# Patient Record
Sex: Male | Born: 1940 | Race: White
Health system: Southern US, Community
[De-identification: ages and names within clinical notes are randomized; demographics above are authoritative.]

## PROBLEM LIST (undated history)

## (undated) DIAGNOSIS — I4940 Unspecified premature depolarization: Secondary | ICD-10-CM

## (undated) DIAGNOSIS — G4733 Obstructive sleep apnea (adult) (pediatric): Secondary | ICD-10-CM

## (undated) DIAGNOSIS — K5900 Constipation, unspecified: Secondary | ICD-10-CM

## (undated) DIAGNOSIS — M48061 Spinal stenosis, lumbar region without neurogenic claudication: Secondary | ICD-10-CM

## (undated) DIAGNOSIS — J8410 Pulmonary fibrosis, unspecified: Secondary | ICD-10-CM

## (undated) DIAGNOSIS — C801 Malignant (primary) neoplasm, unspecified: Secondary | ICD-10-CM

## (undated) DIAGNOSIS — K219 Gastro-esophageal reflux disease without esophagitis: Secondary | ICD-10-CM

## (undated) DIAGNOSIS — E7800 Pure hypercholesterolemia, unspecified: Secondary | ICD-10-CM

## (undated) DIAGNOSIS — R269 Unspecified abnormalities of gait and mobility: Secondary | ICD-10-CM

## (undated) DIAGNOSIS — R42 Dizziness and giddiness: Secondary | ICD-10-CM

## (undated) DIAGNOSIS — R609 Edema, unspecified: Secondary | ICD-10-CM

## (undated) DIAGNOSIS — I2510 Atherosclerotic heart disease of native coronary artery without angina pectoris: Secondary | ICD-10-CM

## (undated) DIAGNOSIS — R413 Other amnesia: Secondary | ICD-10-CM

## (undated) DIAGNOSIS — M810 Age-related osteoporosis without current pathological fracture: Secondary | ICD-10-CM

## (undated) DIAGNOSIS — I219 Acute myocardial infarction, unspecified: Secondary | ICD-10-CM

## (undated) DIAGNOSIS — E559 Vitamin D deficiency, unspecified: Secondary | ICD-10-CM

## (undated) DIAGNOSIS — M109 Gout, unspecified: Secondary | ICD-10-CM

## (undated) DIAGNOSIS — N390 Urinary tract infection, site not specified: Secondary | ICD-10-CM

## (undated) DIAGNOSIS — R29898 Other symptoms and signs involving the musculoskeletal system: Secondary | ICD-10-CM

## (undated) DIAGNOSIS — D649 Anemia, unspecified: Secondary | ICD-10-CM

## (undated) DIAGNOSIS — I10 Essential (primary) hypertension: Secondary | ICD-10-CM

## (undated) DIAGNOSIS — G4730 Sleep apnea, unspecified: Secondary | ICD-10-CM

## (undated) DIAGNOSIS — M1990 Unspecified osteoarthritis, unspecified site: Secondary | ICD-10-CM

## (undated) HISTORY — PX: CORONARY ARTERY BYPASS GRAFT: SHX141

## (undated) HISTORY — DX: Pulmonary fibrosis, unspecified: J84.10

## (undated) HISTORY — DX: Constipation, unspecified: K59.00

## (undated) HISTORY — DX: Gastro-esophageal reflux disease without esophagitis: K21.9

## (undated) HISTORY — DX: Pure hypercholesterolemia, unspecified: E78.00

## (undated) HISTORY — DX: Vitamin D deficiency, unspecified: E55.9

## (undated) HISTORY — DX: Gout, unspecified: M10.9

## (undated) HISTORY — DX: Anemia, unspecified: D64.9

## (undated) HISTORY — DX: Edema, unspecified: R60.9

## (undated) HISTORY — DX: Dizziness and giddiness: R42

## (undated) HISTORY — DX: Age-related osteoporosis without current pathological fracture: M81.0

## (undated) HISTORY — DX: Spinal stenosis, lumbar region without neurogenic claudication: M48.061

## (undated) HISTORY — DX: Obstructive sleep apnea (adult) (pediatric): G47.33

## (undated) HISTORY — DX: Essential (primary) hypertension: I10

## (undated) HISTORY — DX: Malignant (primary) neoplasm, unspecified: C80.1

## (undated) HISTORY — DX: Atherosclerotic heart disease of native coronary artery without angina pectoris: I25.10

## (undated) HISTORY — DX: Unspecified abnormalities of gait and mobility: R26.9

## (undated) HISTORY — DX: Other symptoms and signs involving the musculoskeletal system: R29.898

## (undated) HISTORY — DX: Urinary tract infection, site not specified: N39.0

## (undated) HISTORY — DX: Other amnesia: R41.3

## (undated) HISTORY — DX: Unspecified premature depolarization: I49.40

## (undated) HISTORY — DX: Acute myocardial infarction, unspecified: I21.9

## (undated) HISTORY — PX: JOINT REPLACEMENT: SHX530

## (undated) HISTORY — PX: EYE SURGERY: SHX253

## (undated) HISTORY — PX: HERNIA REPAIR: SHX51

## (undated) HISTORY — PX: TONGUE SURGERY: SHX810

## (undated) HISTORY — PX: CARDIAC SURGERY: SHX584

---

## 1994-03-16 ENCOUNTER — Emergency Department: Admit: 1994-03-16 | Payer: Self-pay | Admitting: Emergency Medicine

## 1996-08-16 ENCOUNTER — Emergency Department: Admit: 1996-08-16 | Payer: Self-pay

## 1997-04-04 ENCOUNTER — Ambulatory Visit
Admit: 1997-04-04 | Disposition: A | Discharge status: Home / Self Care | Payer: Self-pay | Source: Ambulatory Visit | Admitting: Adult Reconstructive Orthopaedic Surgery

## 1997-04-23 ENCOUNTER — Inpatient Hospital Stay
Admission: RE | Admit: 1997-04-23 | Disposition: A | Discharge status: Home / Self Care | Payer: Self-pay | Source: Ambulatory Visit | Admitting: Adult Reconstructive Orthopaedic Surgery

## 1999-08-15 ENCOUNTER — Ambulatory Visit
Admit: 1999-08-15 | Disposition: A | Discharge status: Home / Self Care | Payer: Self-pay | Source: Ambulatory Visit | Admitting: Internal Medicine

## 1999-08-19 ENCOUNTER — Ambulatory Visit
Admit: 1999-08-19 | Disposition: A | Discharge status: Home / Self Care | Payer: Self-pay | Source: Ambulatory Visit | Admitting: Internal Medicine

## 1999-09-02 ENCOUNTER — Ambulatory Visit
Admit: 1999-09-02 | Disposition: A | Discharge status: Home / Self Care | Payer: Self-pay | Source: Ambulatory Visit | Admitting: Urology

## 1999-09-29 ENCOUNTER — Ambulatory Visit
Admit: 1999-09-29 | Disposition: A | Discharge status: Home / Self Care | Payer: Self-pay | Source: Ambulatory Visit | Admitting: Urology

## 1999-11-27 ENCOUNTER — Ambulatory Visit
Admit: 1999-11-27 | Disposition: A | Discharge status: Home / Self Care | Payer: Self-pay | Source: Ambulatory Visit | Admitting: Urology

## 2000-12-22 ENCOUNTER — Ambulatory Visit: Admission: RE | Admit: 2000-12-22 | Payer: Self-pay | Source: Ambulatory Visit | Admitting: Gastroenterology

## 2001-01-20 ENCOUNTER — Emergency Department: Admit: 2001-01-20 | Payer: Self-pay | Admitting: Emergency Medicine

## 2002-05-23 DIAGNOSIS — I10 Essential (primary) hypertension: Secondary | ICD-10-CM | POA: Insufficient documentation

## 2002-05-23 DIAGNOSIS — E7800 Pure hypercholesterolemia, unspecified: Secondary | ICD-10-CM | POA: Diagnosis present

## 2003-08-10 ENCOUNTER — Observation Stay
Admission: EM | Admit: 2003-08-10 | Disposition: A | Discharge status: Home / Self Care | Payer: Self-pay | Source: Emergency Department | Admitting: Internal Medicine

## 2003-09-05 ENCOUNTER — Emergency Department: Admit: 2003-09-05 | Payer: Self-pay | Source: Emergency Department | Admitting: Emergency Medicine

## 2004-11-17 ENCOUNTER — Emergency Department: Admit: 2004-11-17 | Payer: Self-pay | Source: Emergency Department | Admitting: Emergency Medicine

## 2004-12-23 ENCOUNTER — Emergency Department: Admit: 2004-12-23 | Payer: Self-pay | Source: Emergency Department

## 2004-12-23 ENCOUNTER — Inpatient Hospital Stay
Admission: EM | Admit: 2004-12-23 | Disposition: A | Discharge status: Home / Self Care | Payer: Self-pay | Source: Other Acute Inpatient Hospital | Admitting: Cardiovascular Disease

## 2005-01-19 DIAGNOSIS — I2510 Atherosclerotic heart disease of native coronary artery without angina pectoris: Secondary | ICD-10-CM | POA: Diagnosis present

## 2005-01-19 DIAGNOSIS — Z951 Presence of aortocoronary bypass graft: Secondary | ICD-10-CM

## 2005-02-01 ENCOUNTER — Emergency Department: Admit: 2005-02-01 | Payer: Self-pay | Source: Emergency Department

## 2005-02-16 ENCOUNTER — Ambulatory Visit: Admit: 2005-02-16 | Disposition: A | Discharge status: Home / Self Care | Payer: Self-pay | Source: Ambulatory Visit

## 2005-02-22 ENCOUNTER — Ambulatory Visit: Admit: 2005-02-22 | Disposition: A | Discharge status: Home / Self Care | Payer: Self-pay | Source: Ambulatory Visit

## 2005-04-12 ENCOUNTER — Emergency Department: Admit: 2005-04-12 | Payer: Self-pay | Source: Emergency Department

## 2005-08-30 ENCOUNTER — Emergency Department: Admit: 2005-08-30 | Payer: Self-pay | Source: Emergency Department | Admitting: Emergency Medicine

## 2005-08-30 LAB — TSH: TSH: 0.5 u[IU]/mL (ref 0.34–4.82)

## 2005-08-30 LAB — URINALYSIS
Bilirubin, UA: NEGATIVE
Glucose, UA: NEGATIVE
Ketones UA: NEGATIVE
Leukocyte Esterase, UA: NEGATIVE
Nitrite, UA: NEGATIVE
Protein, UR: NEGATIVE
Specific Gravity UA POCT: 1.01 (ref <=1.030)
Urine pH: 6 (ref 5.0–8.0)
Urobilinogen, UA: 0.2

## 2005-08-30 LAB — CBC WITH AUTO DIFFERENTIAL CERNER
Basophils Absolute: 0 /mm3 (ref 0.0–0.2)
Basophils: 0 % (ref 0–2)
Eosinophils Absolute: 0 /mm3 (ref 0.0–0.7)
Eosinophils: 0 % (ref 0–5)
Granulocytes Absolute: 10.3 /mm3 — ABNORMAL HIGH (ref 1.8–8.1)
Hematocrit: 43.5 % (ref 42.0–52.0)
Hgb: 15 G/DL (ref 13.0–17.0)
Lymphocytes Absolute: 0.9 /mm3 (ref 0.5–4.4)
Lymphocytes: 7 % — ABNORMAL LOW (ref 15–41)
MCH: 34.6 PG — ABNORMAL HIGH (ref 28.0–32.0)
MCHC: 34.4 G/DL (ref 32.0–36.0)
MCV: 100.6 FL — ABNORMAL HIGH (ref 80.0–100.0)
MPV: 8.4 FL (ref 7.4–10.4)
Monocytes Absolute: 0.5 /mm3 (ref 0.0–1.2)
Monocytes: 5 % (ref 0–11)
Neutrophils %: 88 % — ABNORMAL HIGH (ref 52–75)
Platelets: 188 /mm3 (ref 140–400)
RBC: 4.32 /mm3 — ABNORMAL LOW (ref 4.70–6.00)
RDW: 14.5 % (ref 11.5–15.0)
WBC: 11.7 /mm3 — ABNORMAL HIGH (ref 3.5–10.8)

## 2005-08-30 LAB — CREATINE KINASE W/O REFLEX (SOFT): Creatine Kinase (CK): 54 U/L (ref 20–297)

## 2005-08-30 LAB — BASIC METABOLIC PANEL
BUN: 17 MG/DL (ref 7–21)
CO2: 23 MEQ/L (ref 22–31)
Calcium: 9.8 MG/DL (ref 8.6–10.2)
Chloride: 105 MEQ/L (ref 98–107)
Creatinine: 0.9 MG/DL (ref 0.5–1.4)
Glucose: 130 MG/DL — ABNORMAL HIGH (ref 70–105)
Potassium: 4.6 MEQ/L (ref 3.6–5.0)
Sodium: 139 MEQ/L (ref 136–143)

## 2005-08-30 LAB — D-DIMER (DVT) CERNER: D-Dimer (DVT): 757 ng/mL — ABNORMAL HIGH (ref 0–500)

## 2005-08-30 LAB — URINE MICROSCOPIC

## 2005-08-30 LAB — GFR

## 2005-08-30 LAB — TROPONIN I QUANTITATIVE LEVEL CERNER: Troponin I: <0.08 ng/mL

## 2005-10-15 ENCOUNTER — Emergency Department
Admit: 2005-10-15 | Payer: Self-pay | Source: Emergency Department | Attending: Emergency Medicine | Admitting: Emergency Medicine

## 2005-10-16 LAB — URINALYSIS
Bilirubin, UA: NEGATIVE
Glucose, UA: NEGATIVE
Ketones UA: NEGATIVE
Leukocyte Esterase, UA: NEGATIVE
Nitrite, UA: NEGATIVE
Protein, UR: NEGATIVE
Specific Gravity UA POCT: <=1.005 (ref <=1.030)
Urine pH: 6.5 (ref 5.0–8.0)
Urobilinogen, UA: 0.2

## 2005-10-16 LAB — COMPREHENSIVE METABOLIC PANEL
ALT: 43 U/L (ref 21–72)
AST (SGOT): 31 U/L (ref 20–57)
Albumin/Globulin Ratio: 1.4 (ref 1.1–1.8)
Albumin: 4.2 G/DL (ref 3.7–5.1)
Alkaline Phosphatase: 110 U/L (ref 43–122)
BUN: 21 MG/DL (ref 7–21)
Bilirubin, Total: 0.5 MG/DL (ref 0.2–1.3)
CO2: 26 MEQ/L (ref 22–31)
Calcium: 9.4 MG/DL (ref 8.6–10.2)
Chloride: 104 MEQ/L (ref 98–107)
Creatinine: 1.1 MG/DL (ref 0.5–1.4)
Globulin: 3.1 G/DL (ref 2.0–3.7)
Glucose: 115 MG/DL — ABNORMAL HIGH (ref 70–105)
Potassium: 4.3 MEQ/L (ref 3.6–5.0)
Protein, Total: 7.3 G/DL (ref 6.0–8.0)
Sodium: 141 MEQ/L (ref 136–143)

## 2005-10-16 LAB — CBC WITH MANUAL DIFF- CERNER
Bands: 4 % (ref 0–9)
Eosinophils %: 2 % (ref 0–5)
Hematocrit: 42.5 % (ref 42.0–52.0)
Hgb: 14.9 G/DL (ref 13.0–17.0)
Lymphocytes Manual: 16 % (ref 15–41)
MCH: 34.8 PG — ABNORMAL HIGH (ref 28.0–32.0)
MCHC: 35.1 G/DL (ref 32.0–36.0)
MCV: 98.9 FL (ref 80.0–100.0)
MPV: 8.2 FL (ref 7.4–10.4)
Monocytes Manual: 6 % (ref 0–8)
Neutrophils %: 72 % (ref 52–75)
Platelets: 174 /mm3 (ref 140–400)
RBC Morphology: ABNORMAL
RBC: 4.29 /mm3 — ABNORMAL LOW (ref 4.70–6.00)
RDW: 13.4 % (ref 11.5–15.0)
WBC: 12.1 /mm3 — ABNORMAL HIGH (ref 3.5–10.8)

## 2005-10-16 LAB — CREATINE KINASE W/O REFLEX (SOFT): Creatine Kinase (CK): 63 U/L (ref 20–297)

## 2005-10-16 LAB — URINE MICROSCOPIC

## 2005-10-16 LAB — GFR

## 2005-10-16 LAB — TROPONIN I QUANTITATIVE LEVEL CERNER: Troponin I: <0.08 ng/mL

## 2005-10-16 LAB — CALCIUM IONIZED-CALC. CERNER: Calcium Ionized Calculated: 2 mEQ/L (ref 1.9–2.3)

## 2005-11-11 ENCOUNTER — Emergency Department: Admit: 2005-11-11 | Payer: Self-pay | Source: Emergency Department

## 2005-11-11 LAB — CBC WITH AUTO DIFFERENTIAL CERNER
Basophils Absolute: 0 /mm3 (ref 0.0–0.2)
Basophils: 0 % (ref 0–2)
Eosinophils Absolute: 0.2 /mm3 (ref 0.0–0.7)
Eosinophils: 1 % (ref 0–5)
Granulocytes Absolute: 11.9 /mm3 — ABNORMAL HIGH (ref 1.8–8.1)
Hematocrit: 38.5 % — ABNORMAL LOW (ref 42.0–52.0)
Hgb: 13.2 G/DL (ref 13.0–17.0)
Lymphocytes Absolute: 0.8 /mm3 (ref 0.5–4.4)
Lymphocytes: 6 % — ABNORMAL LOW (ref 15–41)
MCH: 34.8 PG — ABNORMAL HIGH (ref 28.0–32.0)
MCHC: 34.3 G/DL (ref 32.0–36.0)
MCV: 101.4 FL — ABNORMAL HIGH (ref 80.0–100.0)
MPV: 8.4 FL (ref 7.4–10.4)
Monocytes Absolute: 0.7 /mm3 (ref 0.0–1.2)
Monocytes: 5 % (ref 0–11)
Neutrophils %: 88 % — ABNORMAL HIGH (ref 52–75)
Platelets: 172 /mm3 (ref 140–400)
RBC: 3.79 /mm3 — ABNORMAL LOW (ref 4.70–6.00)
RDW: 13.9 % (ref 11.5–15.0)
WBC: 13.5 /mm3 — ABNORMAL HIGH (ref 3.5–10.8)

## 2005-11-11 LAB — COMPREHENSIVE METABOLIC PANEL
ALT: 31 U/L (ref 21–72)
AST (SGOT): 29 U/L (ref 20–57)
Albumin/Globulin Ratio: 1.5 (ref 1.1–1.8)
Albumin: 3.7 G/DL (ref 3.7–5.1)
Alkaline Phosphatase: 86 U/L (ref 43–122)
BUN: 23 MG/DL — ABNORMAL HIGH (ref 7–21)
Bilirubin, Total: 0.5 MG/DL (ref 0.2–1.3)
CO2: 26 MEQ/L (ref 22–31)
Calcium: 9.3 MG/DL (ref 8.6–10.2)
Chloride: 104 MEQ/L (ref 98–107)
Creatinine: 1.1 MG/DL (ref 0.5–1.4)
Globulin: 2.4 G/DL (ref 2.0–3.7)
Glucose: 111 MG/DL — ABNORMAL HIGH (ref 70–105)
Potassium: 4.8 MEQ/L (ref 3.6–5.0)
Protein, Total: 6.1 G/DL (ref 6.0–8.0)
Sodium: 139 MEQ/L (ref 136–143)

## 2005-11-11 LAB — TROPONIN I QUANTITATIVE LEVEL CERNER: Troponin I: <0.08 ng/mL

## 2005-11-11 LAB — CALCIUM IONIZED-CALC. CERNER: Calcium Ionized Calculated: 2.2 mEQ/L (ref 1.9–2.3)

## 2005-11-11 LAB — GFR

## 2005-11-12 DIAGNOSIS — H8110 Benign paroxysmal vertigo, unspecified ear: Secondary | ICD-10-CM | POA: Insufficient documentation

## 2006-03-28 ENCOUNTER — Emergency Department: Admit: 2006-03-28 | Payer: Self-pay | Source: Emergency Department | Admitting: Emergency Medicine

## 2006-03-28 LAB — URINE MICROSCOPIC

## 2006-03-28 LAB — CBC WITH MANUAL DIFF- CERNER
Bands: 1 % (ref 0–9)
Eosinophils %: 3 % (ref 0–5)
Hematocrit: 40.3 % — ABNORMAL LOW (ref 42.0–52.0)
Hgb: 14.1 G/DL (ref 13.0–17.0)
Lymphocytes Manual: 17 % (ref 15–41)
MCH: 35.4 PG — ABNORMAL HIGH (ref 28.0–32.0)
MCHC: 35 G/DL (ref 32.0–36.0)
MCV: 101 FL — ABNORMAL HIGH (ref 80.0–100.0)
MPV: 8.2 FL (ref 7.4–10.4)
Monocytes Manual: 5 % (ref 0–8)
Neutrophils %: 74 % (ref 52–75)
Platelets: 181 /mm3 (ref 140–400)
RBC Morphology: NORMAL
RBC: 3.99 /mm3 — ABNORMAL LOW (ref 4.70–6.00)
RDW: 13.6 % (ref 11.5–15.0)
WBC: 8.8 /mm3 (ref 3.5–10.8)

## 2006-03-28 LAB — COMPREHENSIVE METABOLIC PANEL
ALT: 25 U/L (ref 21–72)
AST (SGOT): 28 U/L (ref 20–57)
Albumin/Globulin Ratio: 1.6 (ref 1.1–1.8)
Albumin: 4.4 G/DL (ref 3.7–5.1)
Alkaline Phosphatase: 75 U/L (ref 43–122)
BUN: 21 MG/DL (ref 7–21)
Bilirubin, Total: 0.2 MG/DL (ref 0.2–1.3)
CO2: 27 MEQ/L (ref 22–31)
Calcium: 10.1 MG/DL (ref 8.6–10.2)
Chloride: 105 MEQ/L (ref 98–107)
Creatinine: 0.8 MG/DL (ref 0.5–1.4)
Globulin: 2.7 G/DL (ref 2.0–3.7)
Glucose: 147 MG/DL — ABNORMAL HIGH (ref 70–105)
Potassium: 4 MEQ/L (ref 3.6–5.0)
Protein, Total: 7.1 G/DL (ref 6.0–8.0)
Sodium: 141 MEQ/L (ref 136–143)

## 2006-03-28 LAB — URINALYSIS
Bilirubin, UA: NEGATIVE
Glucose, UA: NEGATIVE
Ketones UA: NEGATIVE
Leukocyte Esterase, UA: NEGATIVE
Nitrite, UA: NEGATIVE
Protein, UR: NEGATIVE
Specific Gravity UA POCT: 1.02 (ref <=1.030)
Urine pH: 5.5 (ref 5.0–8.0)
Urobilinogen, UA: 0.2

## 2006-03-28 LAB — CREATINE KINASE W/O REFLEX (SOFT)
Creatine Kinase (CK): 52 U/L (ref 20–297)
Creatine Kinase (CK): 44 U/L (ref 20–297)

## 2006-03-28 LAB — TROPONIN I QUANTITATIVE LEVEL CERNER
Troponin I: 0.02 ng/mL (ref 0.00–0.03)
Troponin I: 0.02 ng/mL (ref 0.00–0.03)

## 2006-03-28 LAB — B-TYPE NATRIURETIC PEPTIDE: B-Natriuretic Peptide: 56 pg/mL (ref ?–100)

## 2006-03-28 LAB — TSH: TSH: 1.16 u[IU]/mL (ref 0.34–4.82)

## 2006-03-28 LAB — GFR

## 2006-03-28 LAB — CALCIUM IONIZED-CALC. CERNER: Calcium Ionized Calculated: 2.2 mEQ/L (ref 1.9–2.3)

## 2006-04-06 ENCOUNTER — Emergency Department: Admit: 2006-04-06 | Payer: Self-pay | Source: Emergency Department | Admitting: Emergency Medicine

## 2006-04-06 LAB — CK: Creatine Kinase (CK): 46 U/L (ref 20–297)

## 2006-04-06 LAB — BASIC METABOLIC PANEL
BUN: 21 mg/dL — ABNORMAL HIGH (ref 8–20)
CO2: 30 mEq/L (ref 21–30)
Calcium: 9.9 mg/dL (ref 8.6–10.2)
Chloride: 101 mEq/L (ref 98–107)
Creatinine: 1 mg/dL (ref 0.6–1.5)
Glucose: 116 mg/dL — ABNORMAL HIGH (ref 70–100)
Potassium: 4.4 mEq/L (ref 3.6–5.0)
Sodium: 140 mEq/L (ref 136–146)

## 2006-04-06 LAB — CBC WITH AUTO DIFFERENTIAL CERNER
Basophils Absolute: 0 /mm3 (ref 0.0–0.2)
Basophils: 0 % (ref 0–2)
Eosinophils Absolute: 0.3 /mm3 (ref 0.0–0.7)
Eosinophils: 3 % (ref 0–5)
Granulocytes Absolute: 6.8 /mm3 (ref 1.8–8.1)
Hematocrit: 40.8 % — ABNORMAL LOW (ref 42.0–52.0)
Hgb: 14 G/DL (ref 13.0–17.0)
Lymphocytes Absolute: 0.9 /mm3 (ref 0.5–4.4)
Lymphocytes: 11 % — ABNORMAL LOW (ref 15–41)
MCH: 35.2 PG — ABNORMAL HIGH (ref 28.0–32.0)
MCHC: 34.3 G/DL (ref 32.0–36.0)
MCV: 102.6 FL — ABNORMAL HIGH (ref 80.0–100.0)
MPV: 7.7 FL (ref 7.4–10.4)
Monocytes Absolute: 0.7 /mm3 (ref 0.0–1.2)
Monocytes: 8 % (ref 0–11)
Neutrophils %: 78 % — ABNORMAL HIGH (ref 52–75)
Platelets: 193 /mm3 (ref 140–400)
RBC: 3.97 /mm3 — ABNORMAL LOW (ref 4.70–6.00)
RDW: 13.6 % (ref 11.5–15.0)
WBC: 8.6 /mm3 (ref 3.5–10.8)

## 2006-04-06 LAB — PT AND APTT
PT INR: 1 {INR} (ref 0.9–1.1)
PT: 12 s (ref 10.8–13.3)
PTT: 25 s (ref 21–32)

## 2006-04-06 LAB — GFR

## 2006-04-06 LAB — I-STAT TROPONIN CERNER: i-STAT Troponin: 0.01 ng/mL

## 2007-01-12 ENCOUNTER — Emergency Department: Admit: 2007-01-12 | Payer: Self-pay | Source: Emergency Department

## 2007-07-06 DIAGNOSIS — D539 Nutritional anemia, unspecified: Secondary | ICD-10-CM | POA: Diagnosis present

## 2010-11-28 LAB — ECG 12-LEAD
Atrial Rate: 82 {beats}/min
Atrial Rate: 89 {beats}/min
P Axis: 73 degrees
P Axis: 76 degrees
P-R Interval: 158 ms
P-R Interval: 164 ms
Q-T Interval: 380 ms
Q-T Interval: 388 ms
QRS Duration: 100 ms
QRS Duration: 98 ms
QTC Calculation (Bezet): 453 ms
QTC Calculation (Bezet): 462 ms
R Axis: 70 degrees
R Axis: 70 degrees
T Axis: 64 degrees
T Axis: 71 degrees
Ventricular Rate: 82 {beats}/min
Ventricular Rate: 89 {beats}/min

## 2010-12-07 LAB — ECG 12-LEAD
Atrial Rate: 90 {beats}/min
P Axis: 84 degrees
P-R Interval: 156 ms
Q-T Interval: 380 ms
QRS Duration: 94 ms
QTC Calculation (Bezet): 464 ms
R Axis: 74 degrees
T Axis: 62 degrees
Ventricular Rate: 90 {beats}/min

## 2010-12-08 LAB — ECG 12-LEAD
Atrial Rate: 79 {beats}/min
Atrial Rate: 74 {beats}/min
P Axis: 74 degrees
P Axis: 74 degrees
P-R Interval: 154 ms
P-R Interval: 158 ms
Q-T Interval: 420 ms
Q-T Interval: 418 ms
QRS Duration: 102 ms
QRS Duration: 102 ms
QTC Calculation (Bezet): 481 ms
QTC Calculation (Bezet): 463 ms
R Axis: 67 degrees
R Axis: 75 degrees
T Axis: 46 degrees
T Axis: 68 degrees
Ventricular Rate: 79 {beats}/min
Ventricular Rate: 74 {beats}/min

## 2010-12-10 NOTE — Consults (Signed)
ATTENDING MD:      Tera Partridge MD: Janace Litten, MD      ADMITTED:      03/28/2006      CONSULTED:      ROOM:          ED            LOCATION OF CONSULTATION:  The patient was seen in the emergency room.            HISTORY OF PRESENT ILLNESS:  This is a 70 year old man, status post      coronary artery bypass grafting in 2006, followed by Dr. Selinda Orion and Mikael Spray.      Last stress test several months ago in July 2007 was normal.  According to      the patient, he has done cardiac rehabilitation at [facility] and      has no problems with that.  He did not have any problems.  On Friday,      03/25/2006, he developed what sounded like a GI upset.  On Saturday on his      way to his routine exercise, he noticed his heart rate was 109.  He      recalled vague left-sided sensation and refused to call it chest pain. He      did not exercise and went home, took a sublingual nitroglycerin for the      first time, did feel better.  Sunday he called the [facility] nurse about his      pulse rate.  She tried to calm him down.  A lady called 1 of the doctors      and spoke to today early this morning around 1:00 a.m., he stated that he      did not feel well and he waited until 3:30 a.m. and drove himself to the      [facility] ER.            PHYSICAL EXAMINATION:  Blood pressure 175/82.  Heart rate 95.  Afebrile.      Pulse oximetry 99%.  No chest pain.  Chest clear.  Cardiac examination:  S4      gallop.  Abdomen:  Soft and nontender.  Extremities:  No peripheral edema.            LABORATORY DATA AND DIAGNOSTIC FINDINGS:  The 12-lead EKG showed possible      old inferior wall infarction, but no acute changes.  This was compared to      old tracings that he had, but he also carries a heart chart with him from      July 2007 that was basically unchanged.  Serial EKGs and enzymes were      negative.  Troponin I was negative x2, so was CPK.            ASSESSMENT AND RECOMMENDATIONS:  Did speak to Dr. Selinda Orion.   The patient was      okay with going home.  He will be seen by Dr. Selinda Orion today for an exercise      stress test in the cardiology office.  He was very anxious.  Questionable      atypical chest pain with known coronary artery disease.  The patient can be      discharged.  Electronic Signing MD: Janace Litten, MD                  D 03/28/2006 11:27 A; T 03/28/2006  4:40 P; [id number] - - , [id number], #[id number]      CC:  Janace Litten, MD

## 2011-06-21 DIAGNOSIS — M48061 Spinal stenosis, lumbar region without neurogenic claudication: Secondary | ICD-10-CM | POA: Insufficient documentation

## 2012-02-25 NOTE — Discharge Summary (Unsigned)
DATE OF BIRTH:                        16-Jan-1941            ADMISSION DATE:                     12/23/2004      DISCHARGE DATE:                     12/31/2004            ATTENDING PHYSICIAN:                  Ollen Gross Massimiano, MD            ADMISSION DIAGNOSES:  Coronary artery disease, history of acute myocardial      infarction, history of hypertension, history of gout, history of hip      replacement, status post intraaortic balloon pump placement.            FINAL DIAGNOSES:  Status post emergency coronary artery bypass grafting      times 5, history of hypertension, history of gout, leukocytosis with      negative cultures that is improving.            HISTORY OF PRESENT ILLNESS:  This is a 72 year old male who presents with      symptoms of acute myocardial infarction. The EKG showed acute inferior      changes.  Cardiac catheterization revealed 3-vessel coronary artery with      reduced ventricular function.  The intraaortic balloon pump was placed.      The patient was emergently taken to the operating room for the coronary      artery bypass grafting.            HOSPITAL COURSE:  During his admission, the patient had acute myocardial      infarction, and a balloon pump was placed.  With the balloon pump in place,      the patient had episodes of diaphoresis and nausea, which were compatible      with ischemia.  The patient was emergently taken to the operating room for      coronary artery bypass grafting.  The patient underwent coronary artery      bypass grafting times 5 with the left internal mammary artery to the left      anterior descending and a reverse saphenous vein graft to diagonal, obtuse      marginal 1, obtuse marginal 2, and right posterior descending arteries.      Moderate inotropic support was started.  The patient had coagulopathy,      which was treated with platelets and factor transfusions, which was      resolved.  The patient was transferred to the CVICU in satisfactory       condition.            On postoperative day #1, the patient was extubated without any      complication.  A chest tube had been placed secondary to increased      drainage. His aortic balloon pump was 1-2 with augmentation of both blood      pressure of 115-120.  Balloon pump was discontinued without any      complication.  He was also weaned off the Primacor and Levophed drip.            The  patient was transferred to the stepdown unit on postoperative day #3      without any complications.  During his postoperative recovery in the      stepdown unit, the patient continued to do well.  His chest tube was      removed without any complication. His repeat white count was noted to be      elevated, and blood cultures and urine cultures was ordered.  He did      receive 1 g of Rocephin prophylactically.  His repeat white count on      postoperative day #6 was 17,000, which was improved compared to the      previous day.            He continued to do well without any evidence of fever.  On postoperative      day #7, the patient's vital signs were stable.  He remains afebrile.  His      O2 saturation was 97% in room air.  His repeat white count was 16,000.  His      repeat white count was 16,000 with preliminary blood culture report, which      was negative.            The patient was discharged home in stable condition.            PROCEDURES:  Emergency coronary artery bypass times 5 with left internal      mammary artery to left anterior descending and reverse saphenous vein graft      to diagonal, obtuse marginal 1, obtuse marginal 2, and right posterior      descending arteries.  Status post intraaortic balloon pump placement in      catheterization lab.            COMPLICATIONS:  Elevation of white count, which was improving.            DISCHARGE MEDICATIONS:  Amiodarone 200 mg once a day, Zocor 40 mg once a      day, aspirin 225 mg once a day, Altace 2.5 mg once a day, Toprol XL 25 mg      once a day, Lasix 20 mg  once a day, K-Dur 20 mEq once a day, Darvocet take      as directed.            DISCHARGE INSTRUCTIONS:  Follow up with cardiovascular surgery in 2 weeks,      follow up with primary care and cardiologist as directed.            CONDITION ON DISCHARGE:  Stable.            DISPOSITION:  Home.                                          ___________________________________          Date Signed: __________      Jacqualin Combes, MD  ([id number])            D: 12/31/2004 by Osborne Casco, PA-C      T: 01/03/2005 by [id number] ([id number]) Dorris Carnes: [id number])      cc:  Jacqualin Combes, MD            [facility]      Dr. Glean Hess Cardiologist

## 2012-02-25 NOTE — Op Note (Unsigned)
DATE OF BIRTH:                        09/27/1940      ADMISSION DATE:                     12/23/2004            PATIENT LOCATION:                    [id number]            DATE OF PROCEDURE:                   12/24/2004      SURGEON:                            Jacqualin Combes, MD      ASSISTANT(S):                         Verdene Lennert, MD                  PREOPERATIVE DIAGNOSES      1.   ACUTE MYOCARDIAL INFARCTION.      2.   SEVERE THREE-VESSEL CORONARY ARTERY DISEASE.      3.   REDUCED VENTRICULAR FUNCTION.      4.   HYPERTENSION.      5.   HISTORY OF HIP REPLACEMENT.      6.   STATUS POST INTRA-AORTIC BALLOON PUMP PLACEMENT IN THE CATHETERIZATION      LAB.            POSTOPERATIVE DIAGNOSIS:            PROCEDURE:  EMERGENCY CORONARY ARTERY BYPASS X5 WITH THE LEFT INTERNAL      MAMMARY ARTERY TO THE LEFT ANTERIOR DESCENDING AND REVERSED SAPHENOUS VEIN      GRAFTS, INDIVIDUALLY, TO THE DIAGONAL, OBTUSE MARGINAL 1, OBTUSE MARGINAL      2, AND RIGHT POSTERIOR DESCENDING ARTERIES.            ASSISTANT:  Gaynelle Arabian, R.N.            INDICATIONS:  This is a 72 year old man who presented with signs and      symptoms of an acute myocardial infarction.  EKG showed acute inferior      changes.  Cardiac catheterization by Dr. Glean Hess revealed severe 3-vessel      coronary artery disease with a reduced ventricular function.  An      intra-aortic balloon pump was placed.  Cardiac enzymes revealed a peak CPK      of 2800.  With the balloon pump in place, the patient did have some      episodes of diaphoresis and nausea, which I felt were compatible with      ischemia, and therefore I felt it appropriate to go emergently to the      operating room.            FINDINGS:  The aorta was of normal length and caliber and had no      significant plaque or calcification.  The lateral and inferior walls were      akinetic.  The inferior wall showed patchy scarring consistent with old      infarction.  The infarct vessel appeared to  be in  OM branches on the      lateral wall.  All the coronary arteries were graftable at 1.5 mm luminal      diameter, the exception being the PDA, which was a very small, 1-mm vessel.      However, it was still graftable.  Vein was harvested using open technique      and was of good quality.  The mammary was excellent.  The patient was      separated from cardiopulmonary bypass without difficulty and with only      moderate inotropic support and the previously placed balloon pump.  He left      the operating room in stable condition, after correction of a      coagulopathy.            DESCRIPTION OF PROCEDURE:  After the induction of adequate general      anesthesia and placement of monitoring lines, the patient was placed in the      supine position on the operating room table and prepped and draped in the      usual sterile fashion.  Vein was harvested from the leg.  The chest was      opened through a median sternotomy incision. The mammary artery was taken      down on a pedicle.  The patient was then fully heparinized.  Conduits were      prepared.  He was then cannulated, placed on cardiopulmonary bypass, and      cooled to 32 degrees.            Distal anastomoses were performed with the technique of intermittent      cross-clamp and fibrillation using running 8-0 Prolene for the vein graft      distals as well as the mammary distal and running 6-0 Prolene for the vein      graft proximals.            At the completion of the grafts, the patient was rewarmed.  Atrial and      ventricular wires were placed.  EKG showed normal sinus rhythm with ST      segments back to their baseline morphology.  He was separated from      cardiopulmonary bypass without difficulty.  Moderate inotropic support was      started in prophylactic fashion.  Cardiac hemodynamics were excellent.  The      heart was decannulated and protamine given.  Cannulation sites were      oversewn with 4-0 Prolene.  Chest tubes were placed in the  left chest and      mediastinum.            The patient had a coagulopathy, which was treated with platelets and factor      transfusions which resolved.  After a prolonged period of observation, the      chest was closed, and sterile dressings were applied.  He left the      operating room in stable condition.                                                ___________________________________          Date Signed: __________      Jacqualin Combes, MD  ([id number])  D: 12/24/2004 by Jacqualin Combes, MD      T: 12/25/2004 by [id number] ([id number]) ([id number])      cc:  Jacqualin Combes, MD

## 2012-02-25 NOTE — Op Note (Signed)
DATE OF BIRTH:                        May 04, 1940      ADMISSION DATE:                     12/23/2004            PATIENT LOCATION:                    [id number]            DATE OF CATHETERIZATION:      CARDIOLOGIST:                       Fortino Sic, MD            CATHETERIZATION NUMBER:            PRECATHETERIZATION DIAGNOSIS:  A 72-YEAR-OLD GENTLEMAN WITH A HISTORY OF      DIABETES MELLITUS, WHO PRESENTS WITH ACUTE ONSET OF SUBSTERNAL CHEST PAIN      AT [facility] CONSISTENT WITH AN ANTERIOR WALL MYOCARDIAL      INFARCTION.            POSTCATHETERIZATION DIAGNOSIS:  HIGH-GRADE MULTIVESSEL CORONARY ARTERY      DISEASE INVOLVING THE OSTIUM OF THE LEFT ANTERIOR DESCENDING ARTERY OF 50%,      OSTIUM OF THE LEFT ANTERIOR DESCENDING ARTERY OF 80% TO 90% WITH      INTRACORONARY CLOT, MORE DISTAL LESION OF 60%, HIGH-GRADE CIRCUMFLEX      MARGINAL STENOSIS OF 95%, HIGH-GRADE MID-RIGHT CORONARY ARTERY STENOSIS OF      90% TO 95%; LEFT VENTRICULAR ANGIOGRAPHY DEMONSTRATING EJECTION FRACTION OF      40% WITH DISTAL ANTEROAPICAL HYPOKINESIS AND BASILAR AND LATERAL WALL      HYPOKINESIS.            PROCEDURE:  LEFT HEART CATHETERIZATION, LEFT AND RIGHT CORONARY      ANGIOGRAPHY, LEFT VENTRICULAR ANGIOGRAPHY, INTRAAORTIC BALLOON PUMP      INSERTION.            PATIENT OF:  Dr. Theresia Lo and Dr. Madelon Lips of [place].            HISTORY:  The patient is a 72 year old male who came to [facility] with substernal chest pain. The patient was noted to have acute      inferior ST segment elevation, anterior ST segment depression consistent      with myocardial infarction. He was taken emergently to the cardiac      catheterization laboratory being transported by ambulance. He was treated      with IV Lopressor, IV nitroglycerin, IV heparin, IV Integrilin.            HEMODYNAMICS:  Blood pressure in the catheterization laboratory was 105/70,      heart rate was 83 beats per minute. LVEDP was 18-22  mmHg.            DESCRIPTION OF PROCEDURE:  After informed consent was obtained, the right      groin was sterilely prepped and draped in the usual fashion and      percutaneous arterial entrance was obtained with a 6-French sheath.      Diagnostic left and right coronary angiography was performed in orthogonal      views using Visipaque as a contrast medium; approximately  90 mL of      Visipaque was utilized for the whole case. Then, LV angiography was      performed in the RAO projection using 10 mL for a total volume of 30 mL. A      pullback was recorded across the aortic valve. At that point a 40-cm      intraaortic balloon pump was inserted using a guidewire up into the      ascending aorta. There was significant tortuosity in the right internal      iliac and the sheath was as sutured in multiple sites. The patient was as      chest pain free and was transported to the CCU in critical but stable      condition. I did speak with Dr. Renae Fickle Massimiano exploring the case and it      was felt that tomorrow morning would be the optimal time for surgery.            Left coronary angiography demonstrates a left main with an ostial stenosis,      50%, bifurcates a calcified LAD. The LAD has an ostial thrombus and a      high-grade plaque at the distal left main and there is also significant      disease in the mid-LAD as well. The circumflex marginal branch has a      high-grade proximal stenosis bifurcating the 3 obtuse marginal branches      distally. Each of them has an ostial stenosis that is quite tight. The      right coronary artery has multiple areas of stenosis but there is a distal      right coronary artery that appears to be filling and is graftable.            LV angiography demonstrates distal anteroapical akinesis and basilar and      lateral wall hypokinesis, EF of 40%. Good high anterior wall function is      noted.            CONCLUSION:   A 72 year old gentleman with diabetes mellitus and acute       inferoposterior wall myocardial infarction. He has significant 3-vessel      coronary artery disease. He should get grafting to 2 marginal branches, the      left anterior descending artery, the diagonal, and the right posterior      descending artery. This should provide him with longterm clinical benefit      and a fairly good outcome. He will be continued on aspirin, beta blockers,      statin, and possible ACE inhibitors post procedure. Follow up will be with      Mikael Spray, Dr. Selinda Orion and associates.                                          Electronic Signing MD: Fortino Sic, MD  ([id number])            D: 12/23/2004 by Fortino Sic, MD      T: 12/24/2004 by [id number] ([id number]) Dorris Carnes: [id number])      cc:  Darrol Poke, MD          Alver Sorrow, MD          Fortino Sic, MD

## 2014-12-24 DIAGNOSIS — R269 Unspecified abnormalities of gait and mobility: Secondary | ICD-10-CM | POA: Diagnosis present

## 2015-05-20 DIAGNOSIS — I700 Atherosclerosis of aorta: Secondary | ICD-10-CM | POA: Insufficient documentation

## 2015-05-22 DIAGNOSIS — M810 Age-related osteoporosis without current pathological fracture: Secondary | ICD-10-CM | POA: Diagnosis present

## 2015-08-19 DIAGNOSIS — M109 Gout, unspecified: Secondary | ICD-10-CM | POA: Insufficient documentation

## 2015-10-13 ENCOUNTER — Emergency Department: Payer: Medicare Other

## 2015-10-13 ENCOUNTER — Emergency Department
Admission: EM | Admit: 2015-10-13 | Discharge: 2015-10-14 | Disposition: A | Discharge status: Other Acute Inpatient Hospital | Payer: Medicare Other | Attending: Student in an Organized Health Care Education/Training Program | Admitting: Student in an Organized Health Care Education/Training Program

## 2015-10-13 DIAGNOSIS — Z79899 Other long term (current) drug therapy: Secondary | ICD-10-CM | POA: Insufficient documentation

## 2015-10-13 DIAGNOSIS — I252 Old myocardial infarction: Secondary | ICD-10-CM | POA: Insufficient documentation

## 2015-10-13 DIAGNOSIS — S73014A Posterior dislocation of right hip, initial encounter: Secondary | ICD-10-CM | POA: Insufficient documentation

## 2015-10-13 DIAGNOSIS — X58XXXA Exposure to other specified factors, initial encounter: Secondary | ICD-10-CM | POA: Insufficient documentation

## 2015-10-13 DIAGNOSIS — Z96649 Presence of unspecified artificial hip joint: Secondary | ICD-10-CM | POA: Insufficient documentation

## 2015-10-13 DIAGNOSIS — Z7982 Long term (current) use of aspirin: Secondary | ICD-10-CM | POA: Insufficient documentation

## 2015-10-13 DIAGNOSIS — I10 Essential (primary) hypertension: Secondary | ICD-10-CM | POA: Insufficient documentation

## 2015-10-13 DIAGNOSIS — S73004A Unspecified dislocation of right hip, initial encounter: Secondary | ICD-10-CM

## 2015-10-13 HISTORY — DX: Essential (primary) hypertension: I10

## 2015-10-13 HISTORY — DX: Acute myocardial infarction, unspecified: I21.9

## 2015-10-13 LAB — CBC AND DIFFERENTIAL
Absolute NRBC: 0 10*3/uL
Basophils Absolute Automated: 0.02 10*3/uL (ref 0.00–0.20)
Basophils Automated: 0.2 %
Eosinophils Absolute Automated: 0.25 10*3/uL (ref 0.00–0.70)
Eosinophils Automated: 2 %
Hematocrit: 33.4 % — ABNORMAL LOW (ref 42.0–52.0)
Hgb: 11.2 g/dL — ABNORMAL LOW (ref 13.0–17.0)
Immature Granulocytes Absolute: 0.05 10*3/uL
Immature Granulocytes: 0.4 %
Lymphocytes Absolute Automated: 1.73 10*3/uL (ref 0.50–4.40)
Lymphocytes Automated: 14.1 %
MCH: 35.1 pg — ABNORMAL HIGH (ref 28.0–32.0)
MCHC: 33.5 g/dL (ref 32.0–36.0)
MCV: 104.7 fL — ABNORMAL HIGH (ref 80.0–100.0)
MPV: 10.4 fL (ref 9.4–12.3)
Monocytes Absolute Automated: 1.3 10*3/uL — ABNORMAL HIGH (ref 0.00–1.20)
Monocytes: 10.6 %
Neutrophils Absolute: 8.96 10*3/uL — ABNORMAL HIGH (ref 1.80–8.10)
Neutrophils: 72.7 %
Nucleated RBC: 0 /100 WBC (ref 0.0–1.0)
Platelets: 180 10*3/uL (ref 140–400)
RBC: 3.19 10*6/uL — ABNORMAL LOW (ref 4.70–6.00)
RDW: 17 % — ABNORMAL HIGH (ref 12–15)
WBC: 12.31 10*3/uL — ABNORMAL HIGH (ref 3.50–10.80)

## 2015-10-13 LAB — BASIC METABOLIC PANEL
Anion Gap: 13 (ref 5.0–15.0)
BUN: 24 mg/dL (ref 9–28)
CO2: 24 mEq/L (ref 22–29)
Calcium: 9.5 mg/dL (ref 7.9–10.2)
Chloride: 103 mEq/L (ref 100–111)
Creatinine: 1.1 mg/dL (ref 0.7–1.3)
Glucose: 185 mg/dL — ABNORMAL HIGH (ref 70–100)
Potassium: 4.3 mEq/L (ref 3.5–5.1)
Sodium: 140 mEq/L (ref 136–145)

## 2015-10-13 LAB — GFR: EGFR: >60

## 2015-10-13 NOTE — ED Notes (Signed)
Bed: E19  Expected date:   Expected time:   Means of arrival:   Comments:

## 2015-10-13 NOTE — ED Provider Notes (Signed)
EMERGENCY DEPARTMENT NOTE    Physician/Midlevel provider first contact with patient: 10/13/15 2329         HISTORY OF PRESENT ILLNESS   Historian:Patient  Translator Used: No    Chief Complaint: Hip Pain     Mechanism of Injury: Nursing (triage) note reviewed for the following pertinent information:  c/o R hip dislocation approx 30 mins ago. Hip replacement 2012.      75 y.o. male with hx noted below, p/w R hip dislocation. Pt reports rolling to side on bed, felt R hip pop, unable to move hip/leg thereafter. No hx of fall/direct trauma. No significant pain. No numbness/tingling      MEDICAL HISTORY     Past Medical History:  Past Medical History   Diagnosis Date   . Hypertension    . Myocardial infarct        Past Surgical History:  Past Surgical History   Procedure Laterality Date   . Joint replacement     . Hernia repair     . Tongue surgery     . Eye surgery         Social History:  Social History     Social History   . Marital Status: Married     Spouse Name: N/A   . Number of Children: N/A   . Years of Education: N/A     Occupational History   . Not on file.     Social History Main Topics   . Smoking status: Never Smoker    . Smokeless tobacco: Not on file   . Alcohol Use: No   . Drug Use: No   . Sexual Activity: Not on file     Other Topics Concern   . Not on file     Social History Narrative   . No narrative on file       Family History:  No family history on file.    Outpatient Medication:  Discharge Medication List as of 10/14/2015  3:15 AM      CONTINUE these medications which have NOT CHANGED    Details   alendronate (FOSAMAX) 70 MG tablet Take 70 mg by mouth every 7 days.Take 1 tab by mouth every 7 days with a full glass of water on an empty stomach. Do not take anything else by mouth or lie down for 30 mins., Until Discontinued, Historical Med      aspirin 81 MG chewable tablet Chew 81 mg by mouth daily., Until Discontinued, Historical Med      atorvastatin (LIPITOR) 40 MG tablet Take 40 mg by mouth  daily., Until Discontinued, Historical Med      metoprolol (LOPRESSOR) 50 MG tablet Take 50 mg by mouth 2 (two) times daily., Until Discontinued, Historical Med      raNITIdine (ZANTAC) 150 MG tablet Take 150 mg by mouth 2 (two) times daily., Until Discontinued, Historical Med               REVIEW OF SYSTEMS     Review of Systems: All other systems reviewed and are negative  CONSTITUTIONAL: No fever/chills  NEUROLOGICAL: No numbness or tingling in the extremities.  MUSCULOSKELETAL: hip dislocation      PHYSICAL EXAM   BP 183/74 mmHg  Pulse 59  Temp(Src) 98.4 F (36.9 C) (Oral)  Resp 21  Ht 5\' 9"  (1.753 m)  Wt 69.4 kg  BMI 22.58 kg/m2  SpO2 100%  Physical Exam:  GEN: NAD,appears well  HEENT: MMM  NECK: soft/supple,  FROM with ease  CV: RRR, nl S1/S2, equal pulses b/l  LUNG: CTA b/l, good air entry b/l  ABDOMEN: soft, ND, NT, +BS, no R/G  EXTREMITIES: RLE internally rotated, unable to move RLE.  No bony tenderness.   Soft compartments. DP +2. Cap refill <2sec  NEURO: unable to assess RLE strength due to dislocation, normal muscle tone, sensation intact  SKIN: no rash        MEDICAL DECISION MAKING       Vital Signs: Reviewed the patient?s vital signs.   Nursing Notes: Reviewed and utilized available nursing notes.  Medical Records Reviewed: Reviewed available past medical records.  Counseling: The emergency provider has spoken with the patient and discussed today?s findings, in addition to providing specific details for the plan of care.  Questions are answered and there is agreement with the plan.          IMAGING STUDIES    The following imaging studies were independently interpreted by the Emergency Medicine Physician.  For full imaging study results please see chart.  Hip: R hip dislocation      PULSE OXIMETRY    Oxygen Saturation by Pulse Oximetry: 100%  Interventions: none  Interpretation:  normal    EMERGENCY DEPT. MEDICATIONS      ED Medication Orders     Start Ordered     Status Ordering Provider     10/14/15 0202 10/14/15 0201  morphine injection 4 mg   Once     Route: Intravenous  Ordered Dose: 4 mg     Last MAR action:  Given Simisola Sandles    10/14/15 0114 10/14/15 0113  propofol (DIPRIVAN) injection 90 mg   Once     Route: Intravenous  Ordered Dose: 90 mg     Last MAR action:  New Bag Kelton Bultman    10/14/15 0025 10/14/15 0024     Once,   Status:  Discontinued     Route: Intravenous  Ordered Dose: 60 mg     Discontinued Tasman Zapata          LABORATORY RESULTS    Ordered and independently interpreted AVAILABLE laboratory tests. Please see results section in chart for full details.      PROCEDURE: PROCEDURAL SEDATION    Performed by the emergency provider  Time: 00:40  Consent:  Informed consent, after discussion of the risks, benefits, and alternatives to the procedure, was obtained  Timeout: A timeout to verify the correct patient, procedure, and site was performed immediately prior to the procedure.   Indication: R hip dislocation  BMI <40: NO  Type of Sedation: deep procedural sedation  ASA Classification:  III  Mallampati Classification: class 2  Last Meal: The patient ate 5.5 hours ago  Patient History:   History of adverse reactions involving sedation/anesthesia: No patient or family history of adverse reaction  Patients H & P remains current: YES  History and Physical and Current Medication list reviewed: YES  Appropriate Candidate: Based on history and airway assessment, patient is an appropriate candidate for Moderate / Procedural Sedation: YES  Preparation: Cardiac monitoring and continuous pulse oximetry.  IV access obtained.  Suction immediately available at bedside.  Patient Position: supine  Anesthesia: Patient was given propofol with appropriate sedation.  See MAR for details.  Post-procedure: Patient tolerated the procedure well with no immediate complications.  Patient recovered from sedation uneventfully and did not require airway intervention. Post anesthesia the patient's vital signs  including respiratory function, cardiovascular function, and temperature  were stable. The patient's pain post anesthesia was assessed as mild. The patient was assessed for nausea post-sedation and the patient denies it. At discharge patient back to baseline mental status and able to tolerate PO fluids in Emergency Department        DISCUSSION/ED COURSE    75 y.o. male with hip dislocation. Attempted reduction in ED, unfortunately unsuccessful. Pt continued to appear well, NV intact with soft compartments. Case discussed with Mikael Spray, will transfer to [facility] ER as per discussion.       DIAGNOSIS      Diagnosis:  Final diagnoses:   Hip dislocation, right, initial encounter       Disposition:  ED Disposition     Transfer to Another Facility Cheryle Horsfall should be transferred out to [facility]            Prescriptions:  Discharge Medication List as of 10/14/2015  3:15 AM      CONTINUE these medications which have NOT CHANGED    Details   alendronate (FOSAMAX) 70 MG tablet Take 70 mg by mouth every 7 days.Take 1 tab by mouth every 7 days with a full glass of water on an empty stomach. Do not take anything else by mouth or lie down for 30 mins., Until Discontinued, Historical Med      aspirin 81 MG chewable tablet Chew 81 mg by mouth daily., Until Discontinued, Historical Med      atorvastatin (LIPITOR) 40 MG tablet Take 40 mg by mouth daily., Until Discontinued, Historical Med      metoprolol (LOPRESSOR) 50 MG tablet Take 50 mg by mouth 2 (two) times daily., Until Discontinued, Historical Med      raNITIdine (ZANTAC) 150 MG tablet Take 150 mg by mouth 2 (two) times daily., Until Discontinued, Historical Med                       Sharon Seller, MD  10/14/15 [phone]

## 2015-10-13 NOTE — ED Notes (Signed)
Bed: E12  Expected date:   Expected time:   Means of arrival:   Comments:  411 to rm 12

## 2015-10-13 NOTE — ED Notes (Signed)
C/o possible R hip dislocation- pt rolled in bed & felt hip out of place. Hip replacement in 2012. Peripheral pulse strong & steady.

## 2015-10-14 ENCOUNTER — Emergency Department: Payer: Medicare Other

## 2015-10-14 MED ORDER — PROPOFOL 10 MG/ML IV EMUL (WRAP)
90.0000 mg | Freq: Once | INTRAVENOUS | Status: AC
Start: 2015-10-14 — End: 2015-10-14
  Administered 2015-10-14: 90 mg via INTRAVENOUS

## 2015-10-14 MED ORDER — MORPHINE SULFATE 4 MG/ML IJ/IV SOLN (WRAP)
4.0000 mg | Freq: Once | Status: AC
Start: 2015-10-14 — End: 2015-10-14
  Administered 2015-10-14: 4 mg via INTRAVENOUS
  Filled 2015-10-14: qty 1

## 2015-10-14 MED ORDER — PROPOFOL 10 MG/ML IV EMUL (WRAP)
60.0000 mg | Freq: Once | INTRAVENOUS | Status: DC
Start: 2015-10-14 — End: 2015-10-14

## 2015-10-14 NOTE — Sedation Documentation (Signed)
20mg propofol given 

## 2015-10-14 NOTE — Sedation Documentation (Signed)
X-Ray at bedside.

## 2015-10-14 NOTE — Sedation Documentation (Signed)
At 0041 40mg  propofol pushed. At 0043 30mg  propofol pushed.

## 2015-10-14 NOTE — ED Notes (Signed)
Bed: E12  Expected date:   Expected time:   Means of arrival:   Comments:

## 2015-10-14 NOTE — Sedation Documentation (Signed)
Attempts to relocate unsuccessful. Will continue to monitor.

## 2015-10-20 LAB — CBC
Hematocrit: 26.7 % — ABNORMAL LOW (ref 42.0–52.0)
Hgb: 8.6 g/dL — ABNORMAL LOW (ref 13.0–17.0)
MCH: 34.7 pg — ABNORMAL HIGH (ref 28.0–32.0)
MCHC: 32.2 g/dL (ref 32.0–36.0)
MCV: 107.7 fL — ABNORMAL HIGH (ref 80.0–100.0)
MPV: 10.4 fL (ref 9.4–12.3)
Nucleated RBC: 0 /100 WBC (ref 0.0–1.0)
Platelets: 171 10*3/uL (ref 140–400)
RBC: 2.48 10*6/uL — ABNORMAL LOW (ref 4.70–6.00)
RDW: 18 % — ABNORMAL HIGH (ref 12–15)
WBC: 11.61 10*3/uL — ABNORMAL HIGH (ref 3.50–10.80)

## 2015-10-20 LAB — BASIC METABOLIC PANEL
BUN: 20 mg/dL (ref 9.0–28.0)
CO2: 25 mEq/L (ref 21–30)
Calcium: 8.9 mg/dL (ref 7.9–10.2)
Chloride: 105 mEq/L (ref 100–111)
Creatinine: 0.9 mg/dL (ref 0.5–1.5)
Glucose: 96 mg/dL (ref 70–100)
Potassium: 4 mEq/L (ref 3.5–5.1)
Sodium: 139 mEq/L (ref 135–146)

## 2015-10-20 LAB — URINALYSIS
Bilirubin, UA: NEGATIVE
Glucose, UA: NEGATIVE
Ketones UA: NEGATIVE
Leukocyte Esterase, UA: NEGATIVE
Nitrite, UA: NEGATIVE
Specific Gravity UA: 1.016 (ref 1.001–1.035)
Urine pH: 5.5 (ref 5.0–8.0)
Urobilinogen, UA: 1 (ref 0.2–2.0)

## 2015-10-20 LAB — URINE MICROSCOPIC

## 2015-10-20 LAB — HEMOLYSIS INDEX: Hemolysis Index: 0 (ref 0–18)

## 2015-10-20 LAB — GFR: EGFR: >60

## 2015-10-23 LAB — HEMOLYSIS INDEX: Hemolysis Index: 3 (ref 0–18)

## 2015-10-23 LAB — BASIC METABOLIC PANEL
BUN: 31 mg/dL — ABNORMAL HIGH (ref 9.0–28.0)
CO2: 27 mEq/L (ref 21–30)
Calcium: 9.1 mg/dL (ref 7.9–10.2)
Chloride: 105 mEq/L (ref 100–111)
Creatinine: 0.8 mg/dL (ref 0.5–1.5)
Glucose: 89 mg/dL (ref 70–100)
Potassium: 4.2 mEq/L (ref 3.5–5.1)
Sodium: 141 mEq/L (ref 135–146)

## 2015-10-23 LAB — CBC
Absolute NRBC: 0 10*3/uL
Hematocrit: 28.5 % — ABNORMAL LOW (ref 42.0–52.0)
Hgb: 9.3 g/dL — ABNORMAL LOW (ref 13.0–17.0)
MCH: 34.3 pg — ABNORMAL HIGH (ref 28.0–32.0)
MCHC: 32.6 g/dL (ref 32.0–36.0)
MCV: 105.2 fL — ABNORMAL HIGH (ref 80.0–100.0)
MPV: 10.7 fL (ref 9.4–12.3)
Nucleated RBC: 0 /100 WBC (ref 0.0–1.0)
Platelets: 282 10*3/uL (ref 140–400)
RBC: 2.71 10*6/uL — ABNORMAL LOW (ref 4.70–6.00)
RDW: 17 % — ABNORMAL HIGH (ref 12–15)
WBC: 11.33 10*3/uL — ABNORMAL HIGH (ref 3.50–10.80)

## 2015-10-23 LAB — GFR: EGFR: >60

## 2015-10-24 NOTE — ED Notes (Signed)
C/o possible R hip dislocation after rolling over in bed. Leg is rotated out. Will continue to monitor.

## 2015-10-27 LAB — CBC
Absolute NRBC: 0 10*3/uL
Hematocrit: 30.4 % — ABNORMAL LOW (ref 42.0–52.0)
Hgb: 9.6 g/dL — ABNORMAL LOW (ref 13.0–17.0)
MCH: 33.8 pg — ABNORMAL HIGH (ref 28.0–32.0)
MCHC: 31.6 g/dL — ABNORMAL LOW (ref 32.0–36.0)
MCV: 107 fL — ABNORMAL HIGH (ref 80.0–100.0)
MPV: 10.5 fL (ref 9.4–12.3)
Nucleated RBC: 0 /100 WBC (ref 0.0–1.0)
Platelets: 332 10*3/uL (ref 140–400)
RBC: 2.84 10*6/uL — ABNORMAL LOW (ref 4.70–6.00)
RDW: 18 % — ABNORMAL HIGH (ref 12–15)
WBC: 14.35 10*3/uL — ABNORMAL HIGH (ref 3.50–10.80)

## 2015-10-27 LAB — BASIC METABOLIC PANEL
BUN: 26 mg/dL (ref 9.0–28.0)
CO2: 26 mEq/L (ref 21–30)
Calcium: 9.2 mg/dL (ref 7.9–10.2)
Chloride: 104 mEq/L (ref 100–111)
Creatinine: 0.8 mg/dL (ref 0.5–1.5)
Glucose: 76 mg/dL (ref 70–100)
Potassium: 4.5 mEq/L (ref 3.5–5.1)
Sodium: 139 mEq/L (ref 135–146)

## 2015-10-27 LAB — HEMOLYSIS INDEX: Hemolysis Index: 3 (ref 0–18)

## 2015-10-27 LAB — GFR: EGFR: >60

## 2015-10-28 LAB — URINE MICROSCOPIC

## 2015-10-28 LAB — URINALYSIS
Bilirubin, UA: NEGATIVE
Blood, UA: NEGATIVE
Glucose, UA: NEGATIVE
Ketones UA: NEGATIVE
Nitrite, UA: NEGATIVE
Protein, UR: NEGATIVE
Specific Gravity UA: 1.018 (ref 1.001–1.035)
Urine pH: 6 (ref 5.0–8.0)
Urobilinogen, UA: 1 (ref 0.2–2.0)

## 2015-10-30 LAB — BASIC METABOLIC PANEL
BUN: 31 mg/dL — ABNORMAL HIGH (ref 9.0–28.0)
CO2: 29 mEq/L (ref 21–30)
Calcium: 9.4 mg/dL (ref 7.9–10.2)
Chloride: 105 mEq/L (ref 100–111)
Creatinine: 0.9 mg/dL (ref 0.5–1.5)
Glucose: 84 mg/dL (ref 70–100)
Potassium: 4.3 mEq/L (ref 3.5–5.1)
Sodium: 141 mEq/L (ref 135–146)

## 2015-10-30 LAB — CBC
Absolute NRBC: 0.02 10*3/uL — ABNORMAL HIGH
Hematocrit: 31.5 % — ABNORMAL LOW (ref 42.0–52.0)
Hgb: 10 g/dL — ABNORMAL LOW (ref 13.0–17.0)
MCH: 33.8 pg — ABNORMAL HIGH (ref 28.0–32.0)
MCHC: 31.7 g/dL — ABNORMAL LOW (ref 32.0–36.0)
MCV: 106.4 fL — ABNORMAL HIGH (ref 80.0–100.0)
MPV: 10.6 fL (ref 9.4–12.3)
Nucleated RBC: 0.1 /100 WBC (ref 0.0–1.0)
Platelets: 293 10*3/uL (ref 140–400)
RBC: 2.96 10*6/uL — ABNORMAL LOW (ref 4.70–6.00)
RDW: 18 % — ABNORMAL HIGH (ref 12–15)
WBC: 13.72 10*3/uL — ABNORMAL HIGH (ref 3.50–10.80)

## 2015-10-30 LAB — GFR: EGFR: >60

## 2015-10-30 LAB — HEMOLYSIS INDEX: Hemolysis Index: 6 (ref 0–18)

## 2015-11-03 LAB — BASIC METABOLIC PANEL
BUN: 25 mg/dL (ref 9.0–28.0)
CO2: 27 mEq/L (ref 21–30)
Calcium: 9.3 mg/dL (ref 7.9–10.2)
Chloride: 104 mEq/L (ref 100–111)
Creatinine: 0.9 mg/dL (ref 0.5–1.5)
Glucose: 87 mg/dL (ref 70–100)
Potassium: 4.7 mEq/L (ref 3.5–5.1)
Sodium: 142 mEq/L (ref 135–146)

## 2015-11-03 LAB — CBC
Absolute NRBC: 0.02 10*3/uL — ABNORMAL HIGH
Hematocrit: 34.1 % — ABNORMAL LOW (ref 42.0–52.0)
Hgb: 10.9 g/dL — ABNORMAL LOW (ref 13.0–17.0)
MCH: 34.6 pg — ABNORMAL HIGH (ref 28.0–32.0)
MCHC: 32 g/dL (ref 32.0–36.0)
MCV: 108.3 fL — ABNORMAL HIGH (ref 80.0–100.0)
MPV: 11.3 fL (ref 9.4–12.3)
Nucleated RBC: 0.2 /100 WBC (ref 0.0–1.0)
Platelets: 176 10*3/uL (ref 140–400)
RBC: 3.15 10*6/uL — ABNORMAL LOW (ref 4.70–6.00)
RDW: 19 % — ABNORMAL HIGH (ref 12–15)
WBC: 11.26 10*3/uL — ABNORMAL HIGH (ref 3.50–10.80)

## 2015-11-03 LAB — GFR: EGFR: >60

## 2015-11-03 LAB — HEMOLYSIS INDEX: Hemolysis Index: 2 (ref 0–18)

## 2015-11-06 LAB — BASIC METABOLIC PANEL
BUN: 22 mg/dL (ref 9.0–28.0)
CO2: 29 mEq/L (ref 21–30)
Calcium: 9.5 mg/dL (ref 7.9–10.2)
Chloride: 106 mEq/L (ref 100–111)
Creatinine: 0.9 mg/dL (ref 0.5–1.5)
Glucose: 98 mg/dL (ref 70–100)
Potassium: 4.4 mEq/L (ref 3.5–5.1)
Sodium: 143 mEq/L (ref 135–146)

## 2015-11-06 LAB — CBC
Absolute NRBC: 0 10*3/uL
Hematocrit: 32.2 % — ABNORMAL LOW (ref 42.0–52.0)
Hgb: 10.4 g/dL — ABNORMAL LOW (ref 13.0–17.0)
MCH: 34.3 pg — ABNORMAL HIGH (ref 28.0–32.0)
MCHC: 32.3 g/dL (ref 32.0–36.0)
MCV: 106.3 fL — ABNORMAL HIGH (ref 80.0–100.0)
MPV: 11.8 fL (ref 9.4–12.3)
Nucleated RBC: 0 /100 WBC (ref 0.0–1.0)
Platelets: 128 10*3/uL — ABNORMAL LOW (ref 140–400)
RBC: 3.03 10*6/uL — ABNORMAL LOW (ref 4.70–6.00)
RDW: 19 % — ABNORMAL HIGH (ref 12–15)
WBC: 9.4 10*3/uL (ref 3.50–10.80)

## 2015-11-06 LAB — GFR: EGFR: >60

## 2015-11-06 LAB — HEMOLYSIS INDEX: Hemolysis Index: 2 (ref 0–18)

## 2015-11-07 LAB — COMPREHENSIVE METABOLIC PANEL
ALT: 32 U/L (ref 0–55)
AST (SGOT): 23 U/L (ref 5–34)
Albumin/Globulin Ratio: 0.9 (ref 0.9–2.2)
Albumin: 2.6 g/dL — ABNORMAL LOW (ref 3.5–5.0)
Alkaline Phosphatase: 135 U/L — ABNORMAL HIGH (ref 38–106)
BUN: 20 mg/dL (ref 9.0–28.0)
Bilirubin, Total: 0.8 mg/dL (ref 0.1–1.2)
CO2: 29 mEq/L (ref 21–30)
Calcium: 9.4 mg/dL (ref 7.9–10.2)
Chloride: 107 mEq/L (ref 100–111)
Creatinine: 0.9 mg/dL (ref 0.5–1.5)
Globulin: 2.9 g/dL (ref 2.0–3.7)
Glucose: 94 mg/dL (ref 70–100)
Potassium: 4 mEq/L (ref 3.5–5.1)
Protein, Total: 5.5 g/dL — ABNORMAL LOW (ref 6.0–8.3)
Sodium: 142 mEq/L (ref 135–146)

## 2015-11-07 LAB — HEMOLYSIS INDEX: Hemolysis Index: 1 (ref 0–18)

## 2015-11-07 LAB — CBC
Absolute NRBC: 0 10*3/uL
Hematocrit: 31.1 % — ABNORMAL LOW (ref 42.0–52.0)
Hgb: 10 g/dL — ABNORMAL LOW (ref 13.0–17.0)
MCH: 33.9 pg — ABNORMAL HIGH (ref 28.0–32.0)
MCHC: 32.2 g/dL (ref 32.0–36.0)
MCV: 105.4 fL — ABNORMAL HIGH (ref 80.0–100.0)
MPV: 11.9 fL (ref 9.4–12.3)
Nucleated RBC: 0 /100 WBC (ref 0.0–1.0)
Platelets: 108 10*3/uL — ABNORMAL LOW (ref 140–400)
RBC: 2.95 10*6/uL — ABNORMAL LOW (ref 4.70–6.00)
RDW: 18 % — ABNORMAL HIGH (ref 12–15)
WBC: 8.6 10*3/uL (ref 3.50–10.80)

## 2015-11-07 LAB — GFR: EGFR: >60

## 2015-11-08 LAB — CBC
Absolute NRBC: 0.03 10*3/uL — ABNORMAL HIGH
Hematocrit: 37.2 % — ABNORMAL LOW (ref 42.0–52.0)
Hgb: 11.8 g/dL — ABNORMAL LOW (ref 13.0–17.0)
MCH: 34.2 pg — ABNORMAL HIGH (ref 28.0–32.0)
MCHC: 31.7 g/dL — ABNORMAL LOW (ref 32.0–36.0)
MCV: 107.8 fL — ABNORMAL HIGH (ref 80.0–100.0)
MPV: 11.8 fL (ref 9.4–12.3)
Nucleated RBC: 0.3 /100 WBC (ref 0.0–1.0)
Platelets: 144 10*3/uL (ref 140–400)
RBC: 3.45 10*6/uL — ABNORMAL LOW (ref 4.70–6.00)
RDW: 19 % — ABNORMAL HIGH (ref 12–15)
WBC: 10.87 10*3/uL — ABNORMAL HIGH (ref 3.50–10.80)

## 2015-11-10 LAB — CBC
Absolute NRBC: 0.02 10*3/uL — ABNORMAL HIGH
Hematocrit: 29.8 % — ABNORMAL LOW (ref 42.0–52.0)
Hgb: 9.6 g/dL — ABNORMAL LOW (ref 13.0–17.0)
MCH: 34.5 pg — ABNORMAL HIGH (ref 28.0–32.0)
MCHC: 32.2 g/dL (ref 32.0–36.0)
MCV: 107.2 fL — ABNORMAL HIGH (ref 80.0–100.0)
MPV: 12.3 fL (ref 9.4–12.3)
Nucleated RBC: 0.3 /100 WBC (ref 0.0–1.0)
Platelets: 106 10*3/uL — ABNORMAL LOW (ref 140–400)
RBC: 2.78 10*6/uL — ABNORMAL LOW (ref 4.70–6.00)
RDW: 19 % — ABNORMAL HIGH (ref 12–15)
WBC: 7.46 10*3/uL (ref 3.50–10.80)

## 2015-11-10 LAB — VITAMIN B12: Vitamin B-12: 409 pg/mL (ref 211–911)

## 2015-11-10 LAB — BASIC METABOLIC PANEL
BUN: 25 mg/dL (ref 9.0–28.0)
CO2: 27 mEq/L (ref 21–30)
Calcium: 9.5 mg/dL (ref 7.9–10.2)
Chloride: 108 mEq/L (ref 100–111)
Creatinine: 0.9 mg/dL (ref 0.5–1.5)
Glucose: 100 mg/dL (ref 70–100)
Potassium: 4.1 mEq/L (ref 3.5–5.1)
Sodium: 143 mEq/L (ref 135–146)

## 2015-11-10 LAB — GFR: EGFR: >60

## 2015-11-10 LAB — HEMOLYSIS INDEX: Hemolysis Index: 4 (ref 0–18)

## 2015-11-10 LAB — FOLATE: Folate: 15.1 ng/mL

## 2015-11-10 LAB — TSH: TSH: 1.26 u[IU]/mL (ref 0.35–4.94)

## 2015-11-13 LAB — CBC
Absolute NRBC: 0 10*3/uL
Hematocrit: 31 % — ABNORMAL LOW (ref 42.0–52.0)
Hgb: 10 g/dL — ABNORMAL LOW (ref 13.0–17.0)
MCH: 34.1 pg — ABNORMAL HIGH (ref 28.0–32.0)
MCHC: 32.3 g/dL (ref 32.0–36.0)
MCV: 105.8 fL — ABNORMAL HIGH (ref 80.0–100.0)
MPV: 11.8 fL (ref 9.4–12.3)
Nucleated RBC: 0 /100 WBC (ref 0.0–1.0)
Platelets: 135 10*3/uL — ABNORMAL LOW (ref 140–400)
RBC: 2.93 10*6/uL — ABNORMAL LOW (ref 4.70–6.00)
RDW: 19 % — ABNORMAL HIGH (ref 12–15)
WBC: 7.76 10*3/uL (ref 3.50–10.80)

## 2015-11-13 LAB — BASIC METABOLIC PANEL
BUN: 21 mg/dL (ref 9.0–28.0)
CO2: 28 mEq/L (ref 21–30)
Calcium: 9.1 mg/dL (ref 7.9–10.2)
Chloride: 106 mEq/L (ref 100–111)
Creatinine: 0.8 mg/dL (ref 0.5–1.5)
Glucose: 92 mg/dL (ref 70–100)
Potassium: 4.2 mEq/L (ref 3.5–5.1)
Sodium: 142 mEq/L (ref 135–146)

## 2015-11-13 LAB — GFR: EGFR: >60

## 2015-11-13 LAB — HEMOLYSIS INDEX: Hemolysis Index: 3 (ref 0–18)

## 2016-03-28 ENCOUNTER — Emergency Department
Admission: EM | Admit: 2016-03-28 | Discharge: 2016-03-28 | Disposition: A | Discharge status: Other Acute Inpatient Hospital | Payer: Medicare Other | Attending: Emergency Medicine | Admitting: Emergency Medicine

## 2016-03-28 ENCOUNTER — Emergency Department: Payer: Medicare Other

## 2016-03-28 DIAGNOSIS — I10 Essential (primary) hypertension: Secondary | ICD-10-CM | POA: Insufficient documentation

## 2016-03-28 DIAGNOSIS — M8588 Other specified disorders of bone density and structure, other site: Secondary | ICD-10-CM | POA: Insufficient documentation

## 2016-03-28 DIAGNOSIS — T84020D Dislocation of internal right hip prosthesis, subsequent encounter: Secondary | ICD-10-CM

## 2016-03-28 DIAGNOSIS — M47816 Spondylosis without myelopathy or radiculopathy, lumbar region: Secondary | ICD-10-CM | POA: Insufficient documentation

## 2016-03-28 DIAGNOSIS — T84020A Dislocation of internal right hip prosthesis, initial encounter: Secondary | ICD-10-CM | POA: Insufficient documentation

## 2016-03-28 DIAGNOSIS — I252 Old myocardial infarction: Secondary | ICD-10-CM | POA: Insufficient documentation

## 2016-03-28 DIAGNOSIS — Z7982 Long term (current) use of aspirin: Secondary | ICD-10-CM | POA: Insufficient documentation

## 2016-03-28 DIAGNOSIS — Z79899 Other long term (current) drug therapy: Secondary | ICD-10-CM | POA: Insufficient documentation

## 2016-03-28 DIAGNOSIS — S73004A Unspecified dislocation of right hip, initial encounter: Secondary | ICD-10-CM

## 2016-03-28 LAB — BASIC METABOLIC PANEL
Anion Gap: 8 (ref 5.0–15.0)
BUN: 19 mg/dL (ref 9–28)
CO2: 28 mEq/L (ref 22–29)
Calcium: 9.5 mg/dL (ref 7.9–10.2)
Chloride: 106 mEq/L (ref 100–111)
Creatinine: 1.2 mg/dL (ref 0.7–1.3)
Glucose: 135 mg/dL — ABNORMAL HIGH (ref 70–100)
Potassium: 5.2 mEq/L — ABNORMAL HIGH (ref 3.5–5.1)
Sodium: 142 mEq/L (ref 136–145)

## 2016-03-28 LAB — CBC AND DIFFERENTIAL
Absolute NRBC: 0 10*3/uL
Basophils Absolute Automated: 0.05 10*3/uL (ref 0.00–0.20)
Basophils Automated: 0.3 %
Eosinophils Absolute Automated: 0.16 10*3/uL (ref 0.00–0.70)
Eosinophils Automated: 0.9 %
Hematocrit: 33.4 % — ABNORMAL LOW (ref 42.0–52.0)
Hgb: 11.4 g/dL — ABNORMAL LOW (ref 13.0–17.0)
Immature Granulocytes Absolute: 0.08 10*3/uL — ABNORMAL HIGH
Immature Granulocytes: 0.4 %
Lymphocytes Absolute Automated: 1.04 10*3/uL (ref 0.50–4.40)
Lymphocytes Automated: 5.8 %
MCH: 36.5 pg — ABNORMAL HIGH (ref 28.0–32.0)
MCHC: 34.1 g/dL (ref 32.0–36.0)
MCV: 107.1 fL — ABNORMAL HIGH (ref 80.0–100.0)
MPV: 11.2 fL (ref 9.4–12.3)
Monocytes Absolute Automated: 1.51 10*3/uL — ABNORMAL HIGH (ref 0.00–1.20)
Monocytes: 8.4 %
Neutrophils Absolute: 15.14 10*3/uL — ABNORMAL HIGH (ref 1.80–8.10)
Neutrophils: 84.2 %
Nucleated RBC: 0 /100 WBC (ref 0.0–1.0)
Platelets: 145 10*3/uL (ref 140–400)
RBC: 3.12 10*6/uL — ABNORMAL LOW (ref 4.70–6.00)
RDW: 17 % — ABNORMAL HIGH (ref 12–15)
WBC: 17.98 10*3/uL — ABNORMAL HIGH (ref 3.50–10.80)

## 2016-03-28 LAB — ECG 12-LEAD
Atrial Rate: 77 {beats}/min
P Axis: 63 degrees
P-R Interval: 160 ms
Q-T Interval: 410 ms
QRS Duration: 102 ms
QTC Calculation (Bezet): 463 ms
R Axis: 28 degrees
T Axis: 20 degrees
Ventricular Rate: 77 {beats}/min

## 2016-03-28 LAB — GFR: EGFR: 59.1

## 2016-03-28 MED ORDER — MORPHINE SULFATE 4 MG/ML IJ/IV SOLN (WRAP)
4.0000 mg | Freq: Once | Status: AC
Start: 2016-03-28 — End: 2016-03-28
  Administered 2016-03-28: 4 mg via INTRAVENOUS
  Filled 2016-03-28: qty 1

## 2016-03-28 MED ORDER — ONDANSETRON HCL 4 MG/2ML IJ SOLN
4.0000 mg | Freq: Once | INTRAMUSCULAR | Status: AC
Start: 2016-03-28 — End: 2016-03-28
  Administered 2016-03-28: 4 mg via INTRAVENOUS
  Filled 2016-03-28: qty 2

## 2016-03-28 MED ORDER — KETOROLAC TROMETHAMINE 30 MG/ML IJ SOLN
15.0000 mg | Freq: Once | INTRAMUSCULAR | Status: AC
Start: 2016-03-28 — End: 2016-03-28
  Administered 2016-03-28: 15 mg via INTRAVENOUS
  Filled 2016-03-28: qty 1

## 2016-03-28 MED ORDER — SODIUM CHLORIDE 0.9 % IV SOLN
100.0000 mL/h | INTRAVENOUS | Status: DC
Start: 2016-03-28 — End: 2016-03-28
  Administered 2016-03-28: 100 mL/h via INTRAVENOUS

## 2016-03-28 NOTE — ED Notes (Signed)
Patient to be transferred to [facility]   [phone] room 5001

## 2016-03-28 NOTE — ED Notes (Signed)
Patient awaiting transfer to [facility] center   Unable to void at this time for urine specimen

## 2016-03-28 NOTE — ED Notes (Signed)
Bed: E18  Expected date:   Expected time:   Means of arrival:   Comments:  closed

## 2016-03-28 NOTE — ED Provider Notes (Signed)
Physician/Midlevel provider first contact with patient: 03/28/16 0140           EMERGENCY DEPARTMENT NOTE    Physician/Midlevel provider first contact with patient: 03/28/16 0140         HISTORY OF PRESENT ILLNESS   Historian: Patient  Translator Used: None    76 y.o. male reports history of R hip replacement in 2012. Today he was laying on his L side in the bed and felt his R hip pop out of place. No f/c, redness/swelling/fall or direct injury. No LE parasthesias or additional symptoms at this time    1. Location of symptoms: R hip   2. Onset of symptoms: Today   3. What was patient doing when symptoms started (Context): At rest   4. Severity: Moderate  5. Timing: Constant   6. Activities that worsen symptoms: Movement   7. Activities that improve symptoms: Nothing   8. Quality: Ache    9. Radiation of symptoms: None   10. Associated signs and Symptoms: None    11. Are symptoms worsening? Y          MEDICAL HISTORY     Past Medical History:  Past Medical History:   Diagnosis Date   . Hypertension    . Myocardial infarct        Past Surgical History:  Past Surgical History:   Procedure Laterality Date   . EYE SURGERY     . HERNIA REPAIR     . JOINT REPLACEMENT     . TONGUE SURGERY         Social History:  Social History     Social History   . Marital status: Married     Spouse name: N/A   . Number of children: N/A   . Years of education: N/A     Occupational History   . Not on file.     Social History Main Topics   . Smoking status: Never Smoker   . Smokeless tobacco: Never Used   . Alcohol use No   . Drug use: No   . Sexual activity: Not on file     Other Topics Concern   . Not on file     Social History Narrative   . No narrative on file       Family History:  History reviewed. No pertinent family history.    Outpatient Medication:  Discharge Medication List as of 03/28/2016  6:56 AM      CONTINUE these medications which have NOT CHANGED    Details   aspirin 81 MG chewable tablet Chew 81 mg by mouth daily., Until  Discontinued, Historical Med      atorvastatin (LIPITOR) 40 MG tablet Take 40 mg by mouth daily., Until Discontinued, Historical Med      colchicine 0.6 MG tablet Take 0.6 mg by mouth daily., Historical Med      metoprolol (LOPRESSOR) 50 MG tablet Take 50 mg by mouth 2 (two) times daily., Until Discontinued, Historical Med      raNITIdine (ZANTAC) 150 MG tablet Take 150 mg by mouth 2 (two) times daily., Until Discontinued, Historical Med      alendronate (FOSAMAX) 70 MG tablet Take 70 mg by mouth every 7 days.Take 1 tab by mouth every 7 days with a full glass of water on an empty stomach. Do not take anything else by mouth or lie down for 30 mins., Until Discontinued, Historical Med  Allergies:  Allergies   Allergen Reactions   . Iodine          REVIEW OF SYSTEMS   Review of Systems   Musculoskeletal: Positive for joint pain.   All other systems reviewed and are negative.        PHYSICAL EXAM     Vitals:    03/28/16 0136 03/28/16 0440   BP: 180/78 152/72   Pulse: 73 80   Resp: 20 18   Temp: 97.8 F (36.6 C) 98.1 F (36.7 C)   TempSrc: Oral Oral   SpO2: 94% 100%   Weight: 69.9 kg    Height: 5\' 9"  (1.753 m)          Nursing note and vitals reviewed.  Constitutional:  Well developed, well nourished. Awake & Oriented x3. Painful disterss  Head:  Atraumatic. Normocephalic.    Eyes:  PERRL. EOMI. Conjunctivae are not pale.  ENT:  Mucous membranes are moist and intact. Oropharynx is clear and symmetric.  Patent airway.  Neck:  Supple. Full ROM.    Cardiovascular:  Regular rate. Regular rhythm. No murmurs, rubs, or gallops.  Pulmonary/Chest:  No evidence of respiratory distress. Clear to auscultation bilaterally.  No wheezing, rales or rhonchi.   Abdominal:  Soft and non-distended. There is no tenderness. No rebound, guarding, or rigidity.  Extremities:  No edema. No cyanosis. No clubbing. Full range of motion in all extremities except r hip with ttp. RLE everted and shortened Pulses 2+ normal sensation.  Nontender R knee or foot Normal sensation   Skin:  Skin is warm and dry.  No diaphoresis. No rash.   Neurological:  Alert, awake, and appropriate. Normal speech. Motor normal.  Psychiatric:  Good eye contact. Normal interaction, affect, and behavior.      MEDICAL DECISION MAKING     XR ordered and hip dislocation noted. EPIC reviewed with prior ED visit 1 year prior for the same with unsuccessful ed reduction s/p transfer to [facility] Case discussed with Dr Loleta Chance [facility]). Will call back regarding likely transfer for [facility] orthopedic evaluation  430A Dr Loleta Chance placed a call bk to the ED. Accepted to [facility] by Dr Cyndie Chime with ortho consult by Dr Kristopher Oppenheim  Made NPO. Screening labs/ekg performed in the ED  WBC of 17 noted. On exam no signs of infection, afebrile. Denies uri symptoms. His wife has flu-like illness however patient denies. UA added.   Pending transportation to [place]. NV intact on exam. No distress after Morphine 4mg  IV    DISCUSSION      Vital Signs: Reviewed the patient?s vital signs.   Nursing Notes: Reviewed and utilized available nursing notes.  Medical Records Reviewed: Reviewed available past medical records.  Counseling: The emergency provider has spoken with the patient and discussed today?s findings, in addition to providing specific details for the plan of care.  Questions are answered and there is agreement with the plan.    IMAGING STUDIES    The following imaging studies were independently interpreted by the Emergency Medicine Physician.  For full imaging study results please see chart.    XR Hip  IMPRESSION:   FINDINGS/     No substantial changes.     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     Osteopenia.     Old fractures of left superior and inferior pubic and probably of right  suprapubic ramus.    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 For full cardiac study results please see chart       EKG Interpretation:   Signed and interpreted by ED Physician   Time Interpreted: 0503  Comparison: 03/2006  Rate: 77   Rhythm: NSR   Axis: Normal    Intervals: Normal   Blocks: None  ST segments: No acute ST elevation, Nonspecific T wave changes  Interpretation: Normal SR with Nonspecific Tw changes      PULSE OXIMETRY    Oxygen Saturation by Pulse Oximetry: 100%  Interventions:   Interpretation: Normal    EMERGENCY DEPT. MEDICATIONS      ED Medication Orders     Start Ordered     Status Ordering Provider    03/28/16 0404 03/28/16 0403    Continuous     Route: Intravenous  Ordered Dose: 100 mL/hr     Discontinued Marnee Spring YVONNE    03/28/16 0243 03/28/16 0242  morphine injection 4 mg  Once     Route: Intravenous  Ordered Dose: 4 mg     Last MAR action:  Given Raliegh Ip    03/28/16 0243 03/28/16 0242  ketorolac (TORADOL) injection 15 mg  Once     Route: Intravenous  Ordered Dose: 15 mg     Last MAR action:  Given Gaynell Eggleton YVONNE    03/28/16 0243 03/28/16 0242  ondansetron (ZOFRAN) injection 4 mg  Once     Route: Intravenous  Ordered Dose: 4 mg     Last MAR action:  Given Special Ranes YVONNE          LABORATORY RESULTS    Ordered and independently interpreted AVAILABLE laboratory tests. Please see results section in chart for full details.  Results for orders placed or performed during the hospital encounter of 03/28/16   CBC with Differential   Result Value Ref Range    WBC 17.98 (H) 3.50 - 10.80 x10 3/uL    Hgb 11.4 (L) 13.0 - 17.0 g/dL    Hematocrit 16.1 (L) 42.0 - 52.0 %    Platelets 145 140 - 400 x10 3/uL    RBC 3.12 (L) 4.70 - 6.00 x10 6/uL    MCV 107.1 (H) 80.0 - 100.0 fL    MCH 36.5 (H) 28.0 - 32.0 pg    MCHC 34.1 32.0 - 36.0 g/dL    RDW 17 (H) 12 - 15 %    MPV 11.2 9.4 - 12.3 fL     Neutrophils 84.2 None %    Lymphocytes Automated 5.8 None %    Monocytes 8.4 None %    Eosinophils Automated 0.9 None %    Basophils Automated 0.3 None %    Immature Granulocyte 0.4 None %    Nucleated RBC 0.0 0.0 - 1.0 /100 WBC    Neutrophils Absolute 15.14 (H) 1.80 - 8.10 x10 3/uL    Abs Lymph Automated 1.04 0.50 - 4.40 x10 3/uL    Abs Mono Automated 1.51 (H) 0.00 - 1.20 x10 3/uL    Abs Eos Automated 0.16 0.00 - 0.70 x10 3/uL    Absolute Baso Automated 0.05 0.00 - 0.20 x10 3/uL    Absolute Immature Granulocyte 0.08 (H) 0 x10 3/uL    Absolute NRBC 0.00 0 x10 3/uL  Basic Metabolic Panel (BMP)   Result Value Ref Range    Glucose 135 (H) 70 - 100 mg/dL    BUN 19 9 - 28 mg/dL    Creatinine 1.2 0.7 - 1.3 mg/dL    Calcium 9.5 7.9 - 29.5 mg/dL    Sodium 621 308 - 657 mEq/L    Potassium 5.2 (H) 3.5 - 5.1 mEq/L    Chloride 106 100 - 111 mEq/L    CO2 28 22 - 29 mEq/L    Anion Gap 8.0 5.0 - 15.0   GFR   Result Value Ref Range    EGFR 59.1    ECG 12 Lead   Result Value Ref Range    Ventricular Rate 77 BPM    Atrial Rate 77 BPM    P-R Interval 160 ms    QRS Duration 102 ms    Q-T Interval 410 ms    QTC Calculation (Bezet) 463 ms    P Axis 63 degrees    R Axis 28 degrees    T Axis 20 degrees       CONSULTATIONS        CRITICAL CARE        ATTESTATIONS        Physician Attestation: I, Dr Marnee Spring DO, have been the primary provider for Jordan [name] during this Emergency Dept visit and have reviewed the chart for accuracy and agree with its content.       DIAGNOSIS      Diagnosis:  Final diagnoses:   Dislocation of right hip, initial encounter       Disposition:  ED Disposition     ED Disposition Condition Date/Time Comment    Transfer to Another Facility  Sun Mar 28, 2016  4:23 AM Jordan [name] should be transferred out to [facility]          Prescriptions:  Discharge Medication List as of 03/28/2016  6:56 AM      CONTINUE these medications which have NOT CHANGED    Details   aspirin 81 MG chewable tablet Chew 81 mg by mouth  daily., Until Discontinued, Historical Med      atorvastatin (LIPITOR) 40 MG tablet Take 40 mg by mouth daily., Until Discontinued, Historical Med      colchicine 0.6 MG tablet Take 0.6 mg by mouth daily., Historical Med      metoprolol (LOPRESSOR) 50 MG tablet Take 50 mg by mouth 2 (two) times daily., Until Discontinued, Historical Med      raNITIdine (ZANTAC) 150 MG tablet Take 150 mg by mouth 2 (two) times daily., Until Discontinued, Historical Med      alendronate (FOSAMAX) 70 MG tablet Take 70 mg by mouth every 7 days.Take 1 tab by mouth every 7 days with a full glass of water on an empty stomach. Do not take anything else by mouth or lie down for 30 mins., Until Discontinued, Historical Med                Raliegh Ip, [place]  03/29/16 [phone]

## 2016-03-28 NOTE — ED Notes (Signed)
Patient transferred to [facility] center

## 2017-03-11 DIAGNOSIS — Z85810 Personal history of malignant neoplasm of tongue: Secondary | ICD-10-CM | POA: Insufficient documentation

## 2017-05-13 ENCOUNTER — Other Ambulatory Visit: Payer: Medicare (Managed Care)

## 2019-04-04 ENCOUNTER — Emergency Department: Payer: Medicare HMO

## 2019-04-04 ENCOUNTER — Inpatient Hospital Stay
Admission: EM | Admit: 2019-04-04 | Discharge: 2019-04-07 | DRG: 554 | Disposition: A | Discharge status: Skilled Nursing Facility | Payer: Medicare HMO | Attending: Internal Medicine | Admitting: Internal Medicine

## 2019-04-04 DIAGNOSIS — I2510 Atherosclerotic heart disease of native coronary artery without angina pectoris: Secondary | ICD-10-CM | POA: Diagnosis present

## 2019-04-04 DIAGNOSIS — E7800 Pure hypercholesterolemia, unspecified: Secondary | ICD-10-CM | POA: Diagnosis present

## 2019-04-04 DIAGNOSIS — Z7982 Long term (current) use of aspirin: Secondary | ICD-10-CM

## 2019-04-04 DIAGNOSIS — Z91041 Radiographic dye allergy status: Secondary | ICD-10-CM

## 2019-04-04 DIAGNOSIS — D7589 Other specified diseases of blood and blood-forming organs: Secondary | ICD-10-CM | POA: Diagnosis not present

## 2019-04-04 DIAGNOSIS — Z79899 Other long term (current) drug therapy: Secondary | ICD-10-CM

## 2019-04-04 DIAGNOSIS — Z20822 Contact with and (suspected) exposure to covid-19: Secondary | ICD-10-CM | POA: Diagnosis present

## 2019-04-04 DIAGNOSIS — R17 Unspecified jaundice: Secondary | ICD-10-CM

## 2019-04-04 DIAGNOSIS — M810 Age-related osteoporosis without current pathological fracture: Secondary | ICD-10-CM | POA: Diagnosis present

## 2019-04-04 DIAGNOSIS — Z96641 Presence of right artificial hip joint: Secondary | ICD-10-CM

## 2019-04-04 DIAGNOSIS — R6510 Systemic inflammatory response syndrome (SIRS) of non-infectious origin without acute organ dysfunction: Secondary | ICD-10-CM | POA: Diagnosis present

## 2019-04-04 DIAGNOSIS — N3000 Acute cystitis without hematuria: Secondary | ICD-10-CM

## 2019-04-04 DIAGNOSIS — I252 Old myocardial infarction: Secondary | ICD-10-CM

## 2019-04-04 DIAGNOSIS — R531 Weakness: Secondary | ICD-10-CM | POA: Diagnosis present

## 2019-04-04 DIAGNOSIS — Z951 Presence of aortocoronary bypass graft: Secondary | ICD-10-CM

## 2019-04-04 DIAGNOSIS — D72829 Elevated white blood cell count, unspecified: Secondary | ICD-10-CM

## 2019-04-04 DIAGNOSIS — K219 Gastro-esophageal reflux disease without esophagitis: Secondary | ICD-10-CM | POA: Diagnosis present

## 2019-04-04 DIAGNOSIS — M109 Gout, unspecified: Principal | ICD-10-CM | POA: Diagnosis present

## 2019-04-04 DIAGNOSIS — Z96643 Presence of artificial hip joint, bilateral: Secondary | ICD-10-CM | POA: Diagnosis present

## 2019-04-04 DIAGNOSIS — E785 Hyperlipidemia, unspecified: Secondary | ICD-10-CM | POA: Diagnosis present

## 2019-04-04 DIAGNOSIS — R269 Unspecified abnormalities of gait and mobility: Secondary | ICD-10-CM | POA: Diagnosis present

## 2019-04-04 DIAGNOSIS — Z96642 Presence of left artificial hip joint: Secondary | ICD-10-CM

## 2019-04-04 DIAGNOSIS — I4510 Unspecified right bundle-branch block: Secondary | ICD-10-CM | POA: Diagnosis present

## 2019-04-04 DIAGNOSIS — D539 Nutritional anemia, unspecified: Secondary | ICD-10-CM | POA: Diagnosis present

## 2019-04-04 DIAGNOSIS — Z85810 Personal history of malignant neoplasm of tongue: Secondary | ICD-10-CM

## 2019-04-04 DIAGNOSIS — R8281 Pyuria: Secondary | ICD-10-CM

## 2019-04-04 DIAGNOSIS — I10 Essential (primary) hypertension: Secondary | ICD-10-CM | POA: Diagnosis present

## 2019-04-04 DIAGNOSIS — Z7983 Long term (current) use of bisphosphonates: Secondary | ICD-10-CM

## 2019-04-04 HISTORY — DX: Sleep apnea, unspecified: G47.30

## 2019-04-04 HISTORY — DX: Unspecified osteoarthritis, unspecified site: M19.90

## 2019-04-04 LAB — CBC AND DIFFERENTIAL
Absolute NRBC: 0.02 10*3/uL — ABNORMAL HIGH (ref 0.00–0.00)
Basophils Absolute Automated: 0.05 10*3/uL (ref 0.00–0.08)
Basophils Automated: 0.2 %
Eosinophils Absolute Automated: 0.01 10*3/uL (ref 0.00–0.44)
Eosinophils Automated: 0 %
Hematocrit: 33.7 % — ABNORMAL LOW (ref 37.6–49.6)
Hgb: 11.1 g/dL — ABNORMAL LOW (ref 12.5–17.1)
Immature Granulocytes Absolute: 0.16 10*3/uL — ABNORMAL HIGH (ref 0.00–0.07)
Immature Granulocytes: 0.7 %
Lymphocytes Absolute Automated: 0.52 10*3/uL (ref 0.42–3.22)
Lymphocytes Automated: 2.3 %
MCH: 35.6 pg — ABNORMAL HIGH (ref 25.1–33.5)
MCHC: 32.9 g/dL (ref 31.5–35.8)
MCV: 108 fL — ABNORMAL HIGH (ref 78.0–96.0)
MPV: 10.8 fL (ref 8.9–12.5)
Monocytes Absolute Automated: 1.93 10*3/uL — ABNORMAL HIGH (ref 0.21–0.85)
Monocytes: 8.6 %
Neutrophils Absolute: 19.82 10*3/uL — ABNORMAL HIGH (ref 1.10–6.33)
Neutrophils: 88.2 %
Nucleated RBC: 0.1 /100 WBC — ABNORMAL HIGH (ref 0.0–0.0)
Platelets: 167 10*3/uL (ref 142–346)
RBC: 3.12 10*6/uL — ABNORMAL LOW (ref 4.20–5.90)
RDW: 18 % — ABNORMAL HIGH (ref 11–15)
WBC: 22.49 10*3/uL — ABNORMAL HIGH (ref 3.10–9.50)

## 2019-04-04 LAB — COMPREHENSIVE METABOLIC PANEL
ALT: 24 U/L (ref 0–55)
AST (SGOT): 24 U/L (ref 5–34)
Albumin/Globulin Ratio: 1.1 (ref 0.9–2.2)
Albumin: 3.5 g/dL (ref 3.5–5.0)
Alkaline Phosphatase: 125 U/L — ABNORMAL HIGH (ref 38–106)
Anion Gap: 9 (ref 5.0–15.0)
BUN: 18 mg/dL (ref 9–28)
Bilirubin, Total: 1.3 mg/dL — ABNORMAL HIGH (ref 0.2–1.2)
CO2: 31 mEq/L — ABNORMAL HIGH (ref 22–29)
Calcium: 9.8 mg/dL (ref 7.9–10.2)
Chloride: 100 mEq/L (ref 100–111)
Creatinine: 1.2 mg/dL (ref 0.7–1.3)
Globulin: 3.3 g/dL (ref 2.0–3.6)
Glucose: 155 mg/dL — ABNORMAL HIGH (ref 70–100)
Potassium: 4.8 mEq/L (ref 3.5–5.1)
Protein, Total: 6.8 g/dL (ref 6.0–8.3)
Sodium: 140 mEq/L (ref 136–145)

## 2019-04-04 LAB — URINALYSIS REFLEX TO MICROSCOPIC EXAM - REFLEX TO CULTURE
Bilirubin, UA: NEGATIVE
Glucose, UA: NEGATIVE
Ketones UA: NEGATIVE
Nitrite, UA: NEGATIVE
Protein, UR: 100 — AB
Specific Gravity UA: 1.019 (ref 1.001–1.035)
Urine pH: 6 (ref 5.0–8.0)
Urobilinogen, UA: 2 mg/dL (ref 0.2–2.0)

## 2019-04-04 LAB — COVID-19 (SARS-COV-2): SARS CoV 2 Overall Result: NEGATIVE

## 2019-04-04 LAB — GFR: EGFR: 58.6

## 2019-04-04 LAB — LACTIC ACID, PLASMA: Lactic Acid: 2 mmol/L (ref 0.2–2.0)

## 2019-04-04 LAB — TROPONIN I: Troponin I: 0.02 ng/mL (ref 0.00–0.05)

## 2019-04-04 MED ORDER — SODIUM CHLORIDE 0.9 % IV MBP
1.00 g | Freq: Once | INTRAVENOUS | Status: AC
Start: 2019-04-04 — End: 2019-04-04
  Administered 2019-04-04: 18:00:00 1 g via INTRAVENOUS
  Filled 2019-04-04: qty 1000

## 2019-04-04 MED ORDER — ACETAMINOPHEN 325 MG PO TABS
650.0000 mg | ORAL_TABLET | Freq: Once | ORAL | Status: AC
Start: 2019-04-04 — End: 2019-04-04
  Administered 2019-04-04: 16:00:00 650 mg via ORAL
  Filled 2019-04-04: qty 2

## 2019-04-04 NOTE — ED Provider Notes (Signed)
History     Chief Complaint   Patient presents with    Generalized weakness     Patient presents to the emergency room with a complaint of weakness.  He is a difficult historian because of what his wife describes as trouble finishing his sentences which has been chronic for a couple of years.  She reports progressive generalized weakness x2 weeks and shuffling gait and trouble with ambulation that started today.  She was noted to have a low-grade fever on arrival to the emergency room.  He denies cough.  He denies abdominal pain.  No vomiting or diarrhea.           Past Medical History:   Diagnosis Date    Hypertension     Myocardial infarct        Past Surgical History:   Procedure Laterality Date    EYE SURGERY      HERNIA REPAIR      JOINT REPLACEMENT      TONGUE SURGERY         History reviewed. No pertinent family history.    Social  Social History     Tobacco Use    Smoking status: Never Smoker    Smokeless tobacco: Never Used   Substance Use Topics    Alcohol use: No    Drug use: No       .     Allergies   Allergen Reactions    Iodine        Home Medications     Med List Status: In Progress Set By: Tamala Julian, RN at 04/04/2019  3:55 PM                alendronate (FOSAMAX) 70 MG tablet     Take 70 mg by mouth every 7 days.Take 1 tab by mouth every 7 days with a full glass of water on an empty stomach. Do not take anything else by mouth or lie down for 30 mins.     aspirin 81 MG chewable tablet     Chew 81 mg by mouth daily.     atorvastatin (LIPITOR) 40 MG tablet     Take 40 mg by mouth daily.     colchicine 0.6 MG tablet     Take 0.6 mg by mouth daily.     metoprolol (LOPRESSOR) 50 MG tablet     Take 50 mg by mouth 2 (two) times daily.     raNITIdine (ZANTAC) 150 MG tablet     Take 150 mg by mouth 2 (two) times daily.           Review of Systems   Constitutional: Negative for chills and fever.   Respiratory: Negative for shortness of breath.    Cardiovascular: Negative for chest pain.    Gastrointestinal: Negative for abdominal pain.   Genitourinary: Negative for dysuria.   Musculoskeletal: Negative for back pain.   Allergic/Immunologic: Negative for immunocompromised state.   Neurological: Positive for weakness. Negative for numbness and headaches.   Psychiatric/Behavioral: Positive for confusion.   All other systems reviewed and are negative.      Physical Exam    BP: 150/66, Heart Rate: 67, Temp: 100.1 F (37.8 C), Resp Rate: 18, SpO2: 96 %, Weight: 65 kg    Physical Exam  Vitals signs and nursing note reviewed.   Constitutional:       Appearance: He is well-developed.   HENT:      Head: Normocephalic and atraumatic.  Eyes:      Pupils: Pupils are equal, round, and reactive to light.   Cardiovascular:      Rate and Rhythm: Normal rate and regular rhythm.      Heart sounds: Murmur present.   Pulmonary:      Effort: Pulmonary effort is normal. No respiratory distress.      Breath sounds: Normal breath sounds. No wheezing or rales.   Chest:      Chest wall: No tenderness.   Abdominal:      General: Bowel sounds are normal. There is no distension.      Palpations: Abdomen is soft.      Tenderness: There is no abdominal tenderness.   Skin:     General: Skin is warm and dry.   Neurological:      General: No focal deficit present.      Mental Status: He is alert and oriented to person, place, and time. Mental status is at baseline.      Cranial Nerves: No cranial nerve deficit.      Motor: No weakness ( Equal strength in all extremities).      Comments: Trouble completing simple sentences, which is chronic as per his wife.           MDM and ED Course     ED Medication Orders (From admission, onward)    Start Ordered     Status Ordering Provider    04/04/19 1812 04/04/19 1811  cefTRIAXone (ROCEPHIN) 1 g in sodium chloride 0.9 % 100 mL IVPB mini-bag plus  Once     Route: Intravenous  Ordered Dose: 1 g     Last MAR action: Stopped Genia Hotter    04/04/19 1611 04/04/19 1610  acetaminophen  (TYLENOL) tablet 650 mg  Once     Route: Oral  Ordered Dose: 650 mg     Last MAR action: Given Arnika Larzelere O         EKG [my interpretation]: NSR at 67 bpm incomplete right bundle branch block,    MDM  Number of Diagnoses or Management Options  Diagnosis management comments: Work-up for altered mental status and weakness to include CBC, CMP, head CT, UA with culture, blood cultures, chest x-ray, EKG, troponin    Results     Procedure Component Value Units Date/Time    COVID-19 (SARS-COV-2) Verne Carrow Rapid) [[id number]] Collected: 04/04/19 1732    Specimen: Nasopharyngeal Swab from Nasopharynx Updated: 04/04/19 1817     Purpose of COVID testing Screening     SARS-CoV-2 Specimen Source Nasopharyngeal     SARS CoV 2 Overall Result Negative    Narrative:      o Collect and clearly label specimen type:  o Upper respiratory specimen: One Nasopharyngeal Dry Swab NO  Transport Media.  o Hand deliver to laboratory ASAP  Indication for testing->Extended care facility admission to  semi private room    UA Reflex to Micro - Reflex to Culture [[id number]]  (Abnormal) Collected: 04/04/19 1659    Specimen: Urine, Clean Catch Updated: 04/04/19 1747     Urine Type Urine, Clean Ca     Color, UA Yellow     Clarity, UA Sl Cloudy     Specific Gravity UA 1.019     Urine pH 6.0     Leukocyte Esterase, UA Small     Nitrite, UA Negative     Protein, UR 100     Glucose, UA Negative     Ketones UA Negative  Urobilinogen, UA 2.0 mg/dL      Bilirubin, UA Negative     Blood, UA Small     RBC, UA 6 - 10 /hpf      WBC, UA 6 - 10 /hpf      Squamous Epithelial Cells, Urine 6 - 10 /hpf     Narrative:      Replace urinary catheter prior to obtaining the urine culture  if it has been in place for greater than or equal to 14  days:->N/A No Foley  Indications for U/A Reflex to Micro - Reflex to  Culture:->Suprapubic Pain/Tenderness or Dysuria    Blood Culture Aerobic and Anaerobic (age 65 or older) [[id number]] Collected: 04/04/19 1732    Specimen:  Blood, Venipuncture Updated: 04/04/19 1732    Narrative:      1 BLUE+1 PURPLE    Lactic Acid [[id number]] Collected: 04/04/19 1659    Specimen: Blood Updated: 04/04/19 1716     Lactic Acid 2.0 mmol/L     Blood Culture Aerobic and Anaerobic (age 27 or older) [[id number]] Collected: 04/04/19 1659    Specimen: Blood, Venipuncture Updated: 04/04/19 1700    Narrative:      1 BLUE+1 PURPLE    Troponin I [[id number]] Collected: 04/04/19 1607    Specimen: Blood Updated: 04/04/19 1648     Troponin I 0.02 ng/mL     Comprehensive metabolic panel [[id number]]  (Abnormal) Collected: 04/04/19 1607    Specimen: Blood Updated: 04/04/19 1640     Glucose 155 mg/dL      BUN 18 mg/dL      Creatinine 1.2 mg/dL      Sodium 244 mEq/L      Potassium 4.8 mEq/L      Chloride 100 mEq/L      CO2 31 mEq/L      Calcium 9.8 mg/dL      Protein, Total 6.8 g/dL      Albumin 3.5 g/dL      AST (SGOT) 24 U/L      ALT 24 U/L      Alkaline Phosphatase 125 U/L      Bilirubin, Total 1.3 mg/dL      Globulin 3.3 g/dL      Albumin/Globulin Ratio 1.1     Anion Gap 9.0    GFR [[id number]] Collected: 04/04/19 1607     Updated: 04/04/19 1640     EGFR 58.6    CBC and differential [[id number]]  (Abnormal) Collected: 04/04/19 1607    Specimen: Blood Updated: 04/04/19 1622     WBC 22.49 x10 3/uL      Hgb 11.1 g/dL      Hematocrit 59.5 %      Platelets 167 x10 3/uL      RBC 3.12 x10 6/uL      MCV 108.0 fL      MCH 35.6 pg      MCHC 32.9 g/dL      RDW 18 %      MPV 10.8 fL      Neutrophils 88.2 %      Lymphocytes Automated 2.3 %      Monocytes 8.6 %      Eosinophils Automated 0.0 %      Basophils Automated 0.2 %      Immature Granulocytes 0.7 %      Nucleated RBC 0.1 /100 WBC      Neutrophils Absolute 19.82 x10 3/uL      Lymphocytes Absolute Automated 0.52 x10 3/uL  Monocytes Absolute Automated 1.93 x10 3/uL      Eosinophils Absolute Automated 0.01 x10 3/uL      Basophils Absolute Automated 0.05 x10 3/uL      Immature Granulocytes Absolute 0.16 x10 3/uL      Absolute NRBC  0.02 x10 3/uL         Radiology Results (24 Hour)     Procedure Component Value Units Date/Time    Knee 4+ Views Right [[id number]] Collected: 04/04/19 2018    Order Status: Completed Updated: 04/04/19 2024    Narrative:      CLINICAL INDICATION: fall knee pain    COMPARISON: None available    FINDINGS:  4  views were obtained. There is no fracture or dislocation.  The bony architecture is normal; scattered soft tissue calcification is  noted and no radiopaque foreign body is demonstrated. Mild suprapatellar  calcifications are seen compatible with small loose bodies.      Impression:        Tricompartmental osteoarthritis    Laurena Slimmer, MD   04/04/2019 8:22 PM    Shoulder Right 2+ Views [[id number]] Collected: 04/04/19 2014    Order Status: Completed Updated: 04/04/19 2020    Narrative:      CLINICAL INDICATION: shoulder pain        FINDINGS:  3  views were obtained. There is no fracture or dislocation.  The bony architecture is remarkable for advanced changes compatible with  degenerative and/or inflammatory arthritis. Multiple intrabursal loose  bodies.  No soft tissue calcification is noted and no radiopaque foreign  body is demonstrated.      Impression:        Advanced changes of the shoulder compatible degenerative  and/or inflammatory arthritis    Laurena Slimmer, MD   04/04/2019 8:18 PM    Hip Right AP and Lateral with Pelvis [[id number]] Collected: 04/04/19 2010    Order Status: Completed Updated: 04/04/19 2016    Narrative:      CLINICAL INDICATION: hardware evaluation.fall    FINDINGS:  AP views of the pelvis were obtained. The joint prothesis is  in good position. There is no displaced fracture or dislocation.  Postsurgical changes are seen in the soft tissues. There are  degenerative changes of the pelvis. There is osteopenia. There are  bilateral hip arthroplasties.. There is new slight cortical irregularity  along the lateral aspect of the right greater trochanter. There is  chronic irregularity left greater  trochanter      Impression:        Good position of the joint prothesis. No displaced fracture  or dislocation. Slight cortical irregularity of the right lateral  greater trochanteric most likely represents degenerative enthesopathy  and/or overlying postsurgical calcification. If the patient is focally  tender over the right hip this could represent a nondisplaced fracture  on a background of osteopenia    Laurena Slimmer, MD   04/04/2019 8:13 PM    CT Head without Contrast [[id number]] Collected: 04/04/19 1724    Order Status: Completed Updated: 04/04/19 1731    Narrative:      CT HEAD WO CONTRAST    CLINICAL INDICATION:   Altered mental status    TECHNIQUE: Noncontrast serial axial images from skull base to vertex  with coronal and sagittal reconstruction.  Note that CT scanning at this site utilizes multiple dose reduction  techniques including automatic exposure control, adjustment of the MAA  and/or KVP according to the patient's size and use of iterative  reconstruction technique.  COMPARISON: CT scan of the head from 01/12/2007    FINDINGS: There is moderate atrophy. There is no mass effect or midline  shift. There are no abnormal intra or extra-axial fluid collections.  Lateral ventricles are prominent but this may be related to atrophy. The  perimesencephalic cistern is patent. There is not evidence of acute  intracranial hemorrhage. Areas of relative hypodensity are noted in the  white matter of the cerebral hemispheres. Atherosclerotic calcifications  are noted.  Visualized paranasal sinuses and mastoid air cells are well aerated.  No acute osseous abnormality is identified.      Impression:          1.  No abnormal mass or acute intracranial hemorrhage is identified.  2.  Areas of relative hypodensity are seen in the white matter of the  cerebral hemispheres. This is a nonspecific finding. It may be related  to chronic ischemic change from small vessel disease.      Tana Felts, MD   04/04/2019 5:29 PM    XR  Chest  AP Portable [[id number]] Collected: 04/04/19 1642    Order Status: Completed Updated: 04/04/19 1645    Narrative:      INDICATION: Weakness    COMPARISON: 01/12/2007    FINDINGS:  A single radiograph of the chest performed. Portable film.  Median sternotomy.  The heart is normal in size.   The mediastinal and hilar structures are within normal limits.  The lung fields are clear. Mild chronic interstitial change. No pleural  effusion.  No pneumothorax.    The  visualized osseous structures demonstrates no acute abnormality.  Multiple healed left rib fractures some of which are still mildly  displaced. This is a new finding when compared to 2008. Multiple new  right rib fractures.      Impression:       Bilateral healed rib fractures are present which have  occurred when compared to 2008. No acute infiltrate. Mild chronic  interstitial changes    Kinnie Feil, MD   04/04/2019 4:43 PM            ED Course as of Apr 03 2028   Wed Apr 04, 2019   1942 Discussed with Dr. Loleta Chance, she will call back about placement    [BO]   2026 Will be accepted to [facility] by Dr. Remi Deter    [BO]      ED Course User Index  [BO] Genia Hotter, MD             Procedures    Clinical Impression & Disposition     Clinical Impression  Final diagnoses:   Weakness   Leukocytosis, unspecified type   Acute cystitis without hematuria        ED Disposition     ED Disposition Condition Date/Time Comment    Transfer to [facility]  Wed Apr 04, 2019  8:28 PM Dr. Arva Chafe Prescriptions    No medications on file                 Genia Hotter, MD  04/04/19 2030

## 2019-04-04 NOTE — ED Notes (Signed)
Bed: E05  Expected date:   Expected time:   Means of arrival:   Comments:  MEDIC 409B

## 2019-04-05 ENCOUNTER — Encounter: Payer: Self-pay | Admitting: Internal Medicine

## 2019-04-05 DIAGNOSIS — R17 Unspecified jaundice: Secondary | ICD-10-CM

## 2019-04-05 DIAGNOSIS — R8281 Pyuria: Secondary | ICD-10-CM

## 2019-04-05 DIAGNOSIS — I10 Essential (primary) hypertension: Secondary | ICD-10-CM

## 2019-04-05 DIAGNOSIS — R531 Weakness: Secondary | ICD-10-CM | POA: Diagnosis present

## 2019-04-05 DIAGNOSIS — E7800 Pure hypercholesterolemia, unspecified: Secondary | ICD-10-CM

## 2019-04-05 DIAGNOSIS — D539 Nutritional anemia, unspecified: Secondary | ICD-10-CM

## 2019-04-05 DIAGNOSIS — Z96642 Presence of left artificial hip joint: Secondary | ICD-10-CM

## 2019-04-05 DIAGNOSIS — Z951 Presence of aortocoronary bypass graft: Secondary | ICD-10-CM

## 2019-04-05 DIAGNOSIS — M810 Age-related osteoporosis without current pathological fracture: Secondary | ICD-10-CM

## 2019-04-05 DIAGNOSIS — Z96641 Presence of right artificial hip joint: Secondary | ICD-10-CM

## 2019-04-05 DIAGNOSIS — R269 Unspecified abnormalities of gait and mobility: Secondary | ICD-10-CM

## 2019-04-05 LAB — HEPATIC FUNCTION PANEL
ALT: 23 U/L (ref 0–55)
AST (SGOT): 23 U/L (ref 5–34)
Albumin/Globulin Ratio: 1 (ref 0.9–2.2)
Albumin: 2.8 g/dL — ABNORMAL LOW (ref 3.5–5.0)
Alkaline Phosphatase: 102 U/L (ref 38–106)
Bilirubin Direct: 0.5 mg/dL (ref 0.0–0.5)
Bilirubin Indirect: 0.4 mg/dL (ref 0.2–1.0)
Bilirubin, Total: 0.9 mg/dL (ref 0.2–1.2)
Globulin: 2.7 g/dL (ref 2.0–3.6)
Protein, Total: 5.5 g/dL — ABNORMAL LOW (ref 6.0–8.3)

## 2019-04-05 LAB — CBC AND DIFFERENTIAL
Absolute NRBC: 0 10*3/uL (ref 0.00–0.00)
Basophils Absolute Automated: 0.02 10*3/uL (ref 0.00–0.08)
Basophils Automated: 0.1 %
Eosinophils Absolute Automated: 0.09 10*3/uL (ref 0.00–0.44)
Eosinophils Automated: 0.7 %
Hematocrit: 29 % — ABNORMAL LOW (ref 37.6–49.6)
Hgb: 9.6 g/dL — ABNORMAL LOW (ref 12.5–17.1)
Immature Granulocytes Absolute: 0.08 10*3/uL — ABNORMAL HIGH (ref 0.00–0.07)
Immature Granulocytes: 0.6 %
Lymphocytes Absolute Automated: 1.04 10*3/uL (ref 0.42–3.22)
Lymphocytes Automated: 7.6 %
MCH: 35.6 pg — ABNORMAL HIGH (ref 25.1–33.5)
MCHC: 33.1 g/dL (ref 31.5–35.8)
MCV: 107.4 fL — ABNORMAL HIGH (ref 78.0–96.0)
MPV: 11.6 fL (ref 8.9–12.5)
Monocytes Absolute Automated: 1.26 10*3/uL — ABNORMAL HIGH (ref 0.21–0.85)
Monocytes: 9.2 %
Neutrophils Absolute: 11.17 10*3/uL — ABNORMAL HIGH (ref 1.10–6.33)
Neutrophils: 81.8 %
Nucleated RBC: 0 /100 WBC (ref 0.0–0.0)
Platelets: 131 10*3/uL — ABNORMAL LOW (ref 142–346)
RBC: 2.7 10*6/uL — ABNORMAL LOW (ref 4.20–5.90)
RDW: 18 % — ABNORMAL HIGH (ref 11–15)
WBC: 13.66 10*3/uL — ABNORMAL HIGH (ref 3.10–9.50)

## 2019-04-05 LAB — BASIC METABOLIC PANEL
Anion Gap: 10 (ref 5.0–15.0)
BUN: 17 mg/dL (ref 9–28)
CO2: 28 mEq/L (ref 22–29)
Calcium: 8.7 mg/dL (ref 7.9–10.2)
Chloride: 103 mEq/L (ref 100–111)
Creatinine: 0.8 mg/dL (ref 0.7–1.3)
Glucose: 82 mg/dL (ref 70–100)
Potassium: 3.6 mEq/L (ref 3.5–5.1)
Sodium: 141 mEq/L (ref 136–145)

## 2019-04-05 LAB — SEDIMENTATION RATE: Sed Rate: 78 mm/Hr — ABNORMAL HIGH (ref 0–15)

## 2019-04-05 LAB — GFR: EGFR: >60

## 2019-04-05 LAB — PHOSPHORUS: Phosphorus: 1.9 mg/dL — ABNORMAL LOW (ref 2.3–4.7)

## 2019-04-05 LAB — MAGNESIUM: Magnesium: 1.9 mg/dL (ref 1.6–2.6)

## 2019-04-05 LAB — C-REACTIVE PROTEIN: C-Reactive Protein: 19.9 mg/dL — ABNORMAL HIGH (ref 0.0–0.5)

## 2019-04-05 MED ORDER — ENOXAPARIN SODIUM 40 MG/0.4ML SC SOLN
40.00 mg | Freq: Every day | SUBCUTANEOUS | Status: DC
Start: 2019-04-05 — End: 2019-04-05
  Administered 2019-04-05: 09:00:00 40 mg via SUBCUTANEOUS
  Filled 2019-04-05: qty 0.4

## 2019-04-05 MED ORDER — FAMOTIDINE 20 MG PO TABS
20.0000 mg | ORAL_TABLET | Freq: Every day | ORAL | Status: DC
Start: 2019-04-05 — End: 2019-04-08
  Administered 2019-04-05 – 2019-04-07 (×3): 20 mg via ORAL
  Filled 2019-04-05 (×3): qty 1

## 2019-04-05 MED ORDER — ASPIRIN 81 MG PO CHEW
81.00 mg | CHEWABLE_TABLET | Freq: Every day | ORAL | Status: DC
Start: 2019-04-05 — End: 2019-04-08
  Administered 2019-04-05 – 2019-04-07 (×3): 81 mg via ORAL
  Filled 2019-04-05 (×3): qty 1

## 2019-04-05 MED ORDER — COLCHICINE 0.6 MG PO TABS
0.6000 mg | ORAL_TABLET | Freq: Every day | ORAL | Status: DC
Start: 2019-04-05 — End: 2019-04-08
  Administered 2019-04-05 – 2019-04-07 (×3): 0.6 mg via ORAL
  Filled 2019-04-05 (×4): qty 1

## 2019-04-05 MED ORDER — FAMOTIDINE 20 MG PO TABS
20.0000 mg | ORAL_TABLET | Freq: Two times a day (BID) | ORAL | Status: DC
Start: 2019-04-05 — End: 2019-04-05

## 2019-04-05 MED ORDER — ATORVASTATIN CALCIUM 10 MG PO TABS
40.0000 mg | ORAL_TABLET | Freq: Every day | ORAL | Status: DC
Start: 2019-04-05 — End: 2019-04-08
  Administered 2019-04-05 – 2019-04-07 (×3): 40 mg via ORAL
  Filled 2019-04-05 (×3): qty 4

## 2019-04-05 MED ORDER — NALOXONE HCL 0.4 MG/ML IJ SOLN (WRAP)
0.20 mg | INTRAMUSCULAR | Status: DC | PRN
Start: 2019-04-05 — End: 2019-04-08

## 2019-04-05 MED ORDER — METOPROLOL TARTRATE 25 MG PO TABS
50.0000 mg | ORAL_TABLET | Freq: Two times a day (BID) | ORAL | Status: DC
Start: 2019-04-05 — End: 2019-04-08
  Administered 2019-04-05 – 2019-04-07 (×4): 50 mg via ORAL
  Filled 2019-04-05 (×2): qty 2
  Filled 2019-04-05: qty 1
  Filled 2019-04-05 (×4): qty 2

## 2019-04-05 MED ORDER — NAPROXEN 250 MG PO TABS
375.00 mg | ORAL_TABLET | Freq: Two times a day (BID) | ORAL | Status: AC
Start: 2019-04-05 — End: 2019-04-07
  Administered 2019-04-05 – 2019-04-07 (×5): 375 mg via ORAL
  Filled 2019-04-05 (×5): qty 2

## 2019-04-05 MED ORDER — SODIUM CHLORIDE 0.9 % IV SOLN
20.0000 mmol | Freq: Once | INTRAVENOUS | Status: AC
  Administered 2019-04-05: 10:00:00 20 mmol via INTRAVENOUS
  Filled 2019-04-05: qty 6.67

## 2019-04-05 NOTE — Progress Notes (Addendum)
NURSING ADMISSION NOTE    Date Time: 04/05/19 3:00 AM  Patient Name: Jordan [name]  Attending Physician: Theone Murdoch, DO    Date of Admission:   04/04/2019    Reason for Admission:   Weakness [R53.1]  Acute cystitis without hematuria [N30.00]  Leukocytosis, unspecified type [D72.829]    Admitted from:   Pt admitted from ED by stretcher accompanied by transporter. Arrived on the floor around 0105 AM.    Nursing Note:   Patient [name]/O x 3, forgetful to date and time. VSS done and documented in the flow sheet. speaking clearly and able to complete sentences. Non telemetry. Oriented patient to the floor and POC. Bed  alarm on and bed in low position. Safety plan form signed by pt. And verbalized understanding.Call bell within reach.     Patient scores as high fall risk due to history of falling. Patient ambulates with [name] walker at home, weakness on the lower extremities notes. PT/OT requested. Pt is minimal assist on turning.  Patient belongings reviewed and belongings list completed. Home medications reviewed. No home medication with the pt. Patient skin pale and warm, 4 eyes skin assessment done with unit supervisor Angelique Blonder. Blanchable redness on the right hip, left elbow, sacral area and coccyx. Abrasion on the left hip. Patient reports pain as 2/10 of the right shoulder, aggravated by  movement. Rest and reposition done. Sling on the bedside, pt's own. White board updated. Physician aware of admission. Advance directive to be verified with the wife as per pt's verbalization.    Active Lines:    PIV # 18 left AC and #20 on the right FA both saline lock.      Vitals:    04/04/19 1900 04/04/19 2223 04/05/19 0001 04/05/19 0112   BP: 121/59 107/53 115/56 136/66   Pulse: 66 (!) 57 (!) 55 63   Resp: 20 13 18 18    Temp:   98.5 F (36.9 C) 97.9 F (36.6 C)   TempSrc:   Oral Oral   SpO2: 95% 96% 97% 97%   Weight:    59.1 kg (130 lb 4.7 oz)   Height:    1.753 m (5\' 9" )

## 2019-04-05 NOTE — Plan of Care (Signed)
Pt alert and oriented x4. Vital signs stable. Denies any pain. UC sent today. Out of bed with PT earlier. Call bell within reach. Will continue to monitor.    Problem: Safety  Goal: Patient will be free from injury during hospitalization  Outcome: Progressing  Goal: Patient will be free from infection during hospitalization  Outcome: Progressing     Problem: Compromised Tissue integrity  Goal: Damaged tissue is healing and protected  Outcome: Progressing     Problem: Impaired Mobility  Goal: Mobility/Activity is maintained at optimal level for patient  Outcome: Progressing

## 2019-04-05 NOTE — UM Notes (Signed)
PayerRudell Cobb # [id number]      HPI 04/04/19  "Jordan [name] is a 79 y.o. 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  Patient also had reported shuffling gait and trouble with ambulation which was new for him today.  Per his wife's report, the patient is been having on and off fevers, however no constitutional symptoms.  Patient denies any dysuria, nausea, vomiting.  Patient otherwise not have any other reported symptoms.    Arrival to the emergency room, the patient was found to be afebrile however did have a slightly elevated temperature at 100.1 F, heart rate was 67, respiratory rate was 18, blood pressure was 150/66 and the patient was saturating at 96% on room air.  Patient's initial lab work-up was notable for leukocytosis with a left shift.  Patient also had noted macrocytosis which is part of his medical history.  No thrombocytopenia, or other significant cell line abnormalities.  Patient had a complete metabolic panel obtained revealing a mildly elevated alkaline phosphatase, however remainder of the CMP was largely unremarkable apart from a slightly elevated bilirubin at 1.3.  Baseline bilirubin is closer to 0.8.  Patient denies any abdominal pain.  A UA was obtained showing pyuria, proteinuria, and hematuria.  There were 6-10 squamous epithelial cells.  Patient had extensive imaging in the emergency room including a CT of the head which did not reveal any acute intracranial abnormalities, however did have findings suggestive of chronic vascular disease.  Chest x-ray was performed showing bilateral healed rib fractures without any evidence of acute intrathoracic pathology.  Plain films were obtained of the right hip and pelvis revealing a cortical irregularity involving the right  lateral greater trochanteric area consistent with degenerative enthesopathy.  Plain films of the right shoulder also obtained revealing arthritic abnormalities, these were also reviewed and plain films of the right knee.  Patient was to be admitted to [facility] [place], however due to bed wait patient was admitted to this medical facility."      WBC: 22.49    "  Plan:   Generalized fatigue  -Etiology at this point is not entirely clear.  Could potentially be infectious given leukocytosis, and slightly elevated temperature on arrival.  SARS COV 2 screening has been ordered, and pending.  Given prior history of prosthetic material within the hip, plan to obtain ESR/CRP.  UA is not consistent with UTI, is likely more representative of pyuria without bacteriuria.  -Follow-up SARS COV 2 screening  -Follow-up blood cultures x2  -Hold off on further antibiotics for now  -Vitals per unit protocol  -PT/OT evaluation  -Consider CT imaging of the right hip to further evaluate potential for fracture.  Will await patient's ambulation with PT.  -Repeat LFTs given slightly elevated total bilirubin.  Will get a fractionated bilirubin.    Chronic medical conditions for which home meds will be resumed:  -History of CAD/hyperlipidemia-resume home aspirin 81 mg daily, metoprolol tartrate 50 mg twice daily, and Lipitor.  -History of gout-resume home colchicine 0.6 mg daily, repeat BMP  -History of gastroesophageal reflux disease-resume home famotidine 20 mg twice daily"      Admit to Observation (Order [id number])  Admission  Date: 04/05/2019 Department: 3B Oncology General Medicine Released By/Authorizing: Dora Sims, FNP (auto-released)   Reason For Exam  Comments:    Order Information    Order Date/Time Release Date/Time Start Date/Time End Date/Time   04/05/19 12:11 AM              04/05/19    Per initial plan    ..."-Follow-up SARS COV 2 screening  -Follow-up blood cultures x2  -Hold off on further antibiotics for now  -Vitals per unit  protocol  -PT/OT evaluation..."     VS stable      Ane Payment, UR Case Manager, [phone]

## 2019-04-05 NOTE — PT Eval Note (Signed)
[facility]  [address]  [place], [place] [address]  [phone]    Physical Therapy Evaluation    Patient: Jordan [name] MRN: [id number]   Unit: 3B ONCOLOGY GENERAL MEDICINE Bed: B284/X324-40    Time of Treatment: Time Calculation  PT Received On: 04/05/19  Start Time: 1005  Stop Time: 1020  Time Calculation (min): 15 min    Consult received for Cheryle Horsfall for PT evaluation and treatment.  Patient's medical condition is appropriate for Physical Therapy  intervention at this time.    Interpreter utilized: no, not indicated      D/C Suggestions   Based on today's performance:  Recommendation  Discharge Recommendation: SNF  DME Recommended for Discharge: Patient already has needed equipment  PT Frequency: 3-4x/wk.    If recommended d/c disposition is not available, patient will need home with 24/7 assistance and HHPT.  Transport Recommendations: stretcher transport    Discharge recommendations are based on patient's progression/regression. Please see most recent note for updated discharge recommendations.    Assessment     DAIMEN BOVEN is a 79 y.o. male admitted 04/04/2019  from home with generalized fatigue x2 weeks as well as shuffling gait and trouble with ambulation and fall. At baseline, pt lives at home with his wife and reports mod I with Ambulation and toileting, otherwise needs assist with other ADL. During evaluation, pt required mod a to EOB and mod A to scoot. Pt with posterior lean tendencies at EOB. Pt stood with max a with strong R and posterior lean. Upon standing, pt initially required max A for balance but progressed to min a and then ambulated 20' with RW with min A. Pt was left at EOB with OT. At this time, would recommend SNF due to extensive assistance and pt reports elderly wife cannot provide physical assistance.       Pt's functional mobility is impacted by:  decreased activity tolerance, decreased balance, decreased bed mobility, gait impairment, decreased insight,  decreased judgment, decreased safety awareness, pain, decreased problem solving, decreased strength and transfers .  There are many comorbidities or other factors that affect plan of care and require modification of task including: assistive device needed for mobility, chronic pain, frequent falls and home alone for a portion of the day.  Standardized tests and exams incorporated into evaluation include balance, cognition/orientation, coordination, ROM  and Strength.  Pt demonstrates a unstable clinical presentation.   Pt would continue to benefit from PT to address these deficits and increase functional independence.     Complexity Level Hx and Co  morbidites Examination Clinical Decision Making Clinical Presentation   High 3 or more 4 or more Multiple options Unstable, unpredictable       PMP - Progressive Mobility Protocol   PMP Activity: Step 6 - Walks in Room  Distance Walked (ft) (Step 6,7): 20 Feet     Rehabilitation Potential: good        Interdisciplinary Communication: Discussed with RN/OT      Plan     Plan  Risks/Benefits/POC Discussed with Pt/Family: With patient  Treatment/Interventions: Gait training;Neuromuscular re-education;Functional transfer training;LE strengthening/ROM;Endurance training;Cognitive reorientation;Bed mobility  PT Frequency: 3-4x/wk         Medical Diagnosis: Weakness [R53.1]  Acute cystitis without hematuria [N30.00]  Leukocytosis, unspecified type [D72.829]    History of Present Illness: [name] is a 79 y.o. 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 Patient also had reported shuffling gait and trouble with ambulation which was new for him today. Per his wife's report, the patient is been having on and off fevers, however no constitutional  symptoms. Patient denies any dysuria, nausea, vomiting. Patient otherwise not have any other reported symptoms.    Arrival to the emergency room, the patient was found to be afebrile however did have a slightly elevated temperature at 100.1 F, heart rate was 67, respiratory rate was 18, blood pressure was 150/66 and the patient was saturating at 96% on room air. Patient's initial lab work-up was notable for leukocytosis with a left shift. Patient also had noted macrocytosis which is part of his medical history. No thrombocytopenia, or other significant cell line abnormalities. Patient had a complete metabolic panel obtained revealing a mildly elevated alkaline phosphatase, however remainder of the CMP was largely unremarkable apart from a slightly elevated bilirubin at 1.3. Baseline bilirubin is closer to 0.8. Patient denies any abdominal pain. A UA was obtained showing pyuria, proteinuria, and hematuria. There were 6-10 squamous epithelial cells. Patient had extensive imaging in the emergency room including a CT of the head which did not reveal any acute intracranial abnormalities, however did have findings suggestive of chronic vascular disease. Chest x-ray was performed showing bilateral healed rib fractures without any evidence of acute intrathoracic pathology. Plain films were obtained of the right hip and pelvis revealing a cortical irregularity involving the right lateral greater trochanteric area consistent with degenerative enthesopathy. Plain films of the right shoulder also obtained revealing arthritic abnormalities, these were also reviewed and plain films of the right knee. Patient was to be admitted to [facility], however due to bed weight patient was admitted to this medical facility"      Patient Active Problem List   Diagnosis    Weakness    Benign paroxysmal positional vertigo    CAD (coronary artery disease)    Gait abnormality    GERD (gastroesophageal reflux disease)    Gout     Hardening of the aorta (main artery of the heart)    History of total left hip replacement    History of total right hip replacement    HTN (hypertension)    Hx of CABG    Hx of tongue cancer    Hypercholesterolemia    Macrocytic anemia    Osteoporosis    Spinal stenosis of lumbar region    Pyuria    Serum total bilirubin elevated     Past Medical History:   Diagnosis Date    Arthritis     Hypertension     Myocardial infarct     Sleep apnea      Past Surgical History:   Procedure Laterality Date    CARDIAC SURGERY      EYE SURGERY      HERNIA REPAIR      JOINT REPLACEMENT      TONGUE SURGERY         X-Rays/Tests/Labs:  Lab Results   Component Value Date/Time    HGB 9.6 (L) 04/05/2019 05:31 AM    HCT 29.0 (L) 04/05/2019 05:31 AM    K 3.6 04/05/2019 05:31 AM    NA 141 04/05/2019 05:31 AM    INR 1.0 04/06/2006 11:40 AM    TROPI 0.02 04/04/2019 04:07 PM    TROPI 0.02 03/28/2006 10:15 AM  TROPI 0.02 03/28/2006 04:40 AM    TROPI <0.08 11/11/2005 08:20 PM    TROPI <0.08 10/16/2005 01:30 AM     Shoulder Right 2+ Views (Order: [id number]) - 04/04/2019  IMPRESSION:   Advanced changes of the shoulder compatible degenerative  and/or inflammatory arthritis    Knee 4+ Views Right (Order: [id number]) - 04/04/2019  IMPRESSION:   Tricompartmental osteoarthritis    Hip Right AP and Lateral with Pelvis (Order: [id number]) - 04/04/2019  IMPRESSION:   Good position of the joint prothesis. No displaced fracture  or dislocation. Slight cortical irregularity of the right lateral  greater trochanteric most likely represents degenerative enthesopathy  and/or overlying postsurgical calcification. If the patient is focally  tender over the right hip this could represent a nondisplaced fracture  on a background of osteopenia    CT Head without Contrast (Order: [id number]) - 04/04/2019  IMPRESSION:   1. No abnormal mass or acute intracranial hemorrhage is identified.  2. Areas of relative hypodensity are seen in the  white matter of the  cerebral hemispheres. This is a nonspecific finding. It may be related  to chronic ischemic change from small vessel disease.    XR Chest AP Portable (Order: [id number]) - 04/04/2019  IMPRESSION:   Bilateral healed rib fractures are present which have  occurred when compared to 2008. No acute infiltrate. Mild chronic  interstitial changes    Social History:  Lives with wife in a house.  Entry Steps: ramp    Inside steps: stair glide   Equipment at home:  rolling walker, rollator, tub/shower combination and tub transfer bench  Prior Level of Function:     Cognition: forgetful, word finding difficulities    Mobility/Locomotion: mod indep with rollator on main level and RW on upper level   Feeding: set up   Grooming: mod indep   Bathing: assist for transfer   Dressing: assist due to R shoulder pain   Toileting: mod indep    Subjective   Patient is agreeable to participation in the therapy session. Nursing clears patient for therapy.  Patient's Goal:  None stated  Pain: no pain at rest but c/o pain in R shoulder and R knee with gait  Therapist Intervention: positioned for comfort  Patient is satisfied with therapist intervention.    Objective     Precautions/ Contraindications:   Precautions  Weight Bearing Status: no restrictions  Other Precautions: falls, HOH    Patient is in bed with  Telemetry and Intravenous Access in place.        Observation of patient/vitals:      Vitals:    04/05/19 0112 04/05/19 0542 04/05/19 0735 04/05/19 0920   BP: 136/66 119/63 138/69 112/62   Pulse: 63 62 74 76   Resp: 18 18 18     Temp: 97.9 F (36.6 C) 98.2 F (36.8 C) 97.3 F (36.3 C)    TempSrc: Oral Oral Oral    SpO2: 97% 96% 95%    Weight: 59.1 kg (130 lb 4.7 oz)      Height: 1.753 m (5\' 9" )          Orientation/Cognition:  Alert and Oriented x 4  Cognition: follows commands with increased time; dec insight to current deficits    Musculoskeletal Examination:      ROM Strength   RLE Grossly WFL 4-/5   LLE  Grossly WFL 4-/5       Sensation: Intact to light touch, denies numbness/tingling to BLEs   Coordination: fair  Functional Mobility:    Supine to sit: mod a  Scooting: mod a   Sit to stand: max a (R and posterior lean)  Stand to sit: min a  Ambulation:     Weightbearing: no restrctions   Assistance level: initially max a but progressed to min a    Distance: 20'   Assistive Device: RW   Gait Deviations: shuffling, R knee genevalgum, kyphotic, dec cadence, R lean       Balance:  Static Sit: fair  Dynamic Sit: fair-  Static Stand: poor but progressed to Fair  Dynamic Stand: poor but progressed to fair-    Endurance: fair    Participation:  good    Education:  Educated the patient to role of physical therapy, plan of care, goals  of therapy and safety with mobility and ADLs.    Patient is seated at EOB with OT present with all needs met, and equipment intact.  RN notified of session outcome.  Safety measures include: handoff to nurse/clin tech/ unit secretary completed.  Mobility status posted at bedside and within E.M.R.               Goals  Goal Formulation: With patient  Time for Goal Acheivement: 5 visits  Goals: Select goal  Pt Will Go Supine To Sit: with supervision  Pt Will Perform Sit to Stand: with supervision  Pt Will Transfer Bed/Chair: with rolling walker;with supervision  Pt Will Ambulate: 51-100 feet;with rolling walker;with supervision       Therapist PPE during session procedural mask, goggles  and gloves       Signature:  Sedonia Small, PT  04/05/2019  11:24 AM  Phone: [phone]     (For scheduling questions, please contact rehab tech [phone])

## 2019-04-05 NOTE — Plan of Care (Signed)
Problem: Safety  Goal: Patient will be free from injury during hospitalization  Outcome: Progressing  Flowsheets (Taken 04/05/2019 0316)  Patient will be free from injury during hospitalization:   Assess patient's risk for falls and implement fall prevention plan of care per policy   Use appropriate transfer methods   Ensure appropriate safety devices are available at the bedside   Provide and maintain safe environment   Include patient/ family/ care giver in decisions related to safety   Hourly rounding  Note: Call light in reach of the pt. Bed alarm on.  Goal: Patient will be free from infection during hospitalization  Outcome: Progressing  Flowsheets (Taken 04/05/2019 0316)  Free from Infection during hospitalization:   Assess and monitor for signs and symptoms of infection   Monitor lab/diagnostic results   Monitor all insertion sites (i.e. indwelling lines, tubes, urinary catheters, and drains)   Encourage patient and family to use good hand hygiene technique     Problem: Pain  Goal: Pain at adequate level as identified by patient  Outcome: Progressing  Flowsheets (Taken 04/05/2019 0316)  Pain at adequate level as identified by patient:   Assess pain on admission, during daily assessment and/or before any "as needed" intervention(s)   Reassess pain within 30-60 minutes of any procedure/intervention, per Pain Assessment, Intervention, Reassessment (AIR) Cycle   Evaluate if patient comfort function goal is met   Evaluate patient's satisfaction with pain management progress   Offer non-pharmacological pain management interventions   Consult/collaborate with Physical Therapy, Occupational Therapy, and/or Speech Therapy   Include patient/patient care companion in decisions related to pain management as needed     Problem: Compromised Tissue integrity  Goal: Damaged tissue is healing and protected  Outcome: Progressing  Flowsheets (Taken 04/05/2019 0316)  Damaged tissue is healing and protected:   Monitor/assess Braden  scale every shift   Reposition patient every 2 hours and as needed unless able to reposition self   Increase activity as tolerated/progressive mobility   Relieve pressure to bony prominences for patients at moderate and high risk   Keep intact skin clean and dry  Goal: Nutritional status is improving  Outcome: Progressing  Flowsheets (Taken 04/05/2019 0316)  Nutritional status is improving:   Assist patient with eating   Allow adequate time for meals   Collaborate with Clinical Nutritionist     Problem: Impaired Mobility  Goal: Mobility/Activity is maintained at optimal level for patient  Outcome: Progressing  Flowsheets (Taken 04/05/2019 0316)  Mobility/activity is maintained at optimal level for patient:   Increase mobility as tolerated/progressive mobility   Encourage independent activity per ability   Maintain proper body alignment   Perform active/passive ROM   Plan activities to conserve energy, plan rest periods   Consult/collaborate with Physical Therapy and/or Occupational Therapy

## 2019-04-05 NOTE — Progress Notes (Signed)
04/05/19 1657   Healthcare Decisions   Advance Directive Patient does not have advance directive   Healthcare Agent Appointed No   Prior to admission   Prior level of function Needs assistance with ADLs;Ambulates with assistive device   Type of Residence Private residence   Have running water, electricity, heat, etc? Yes   Living Arrangements Spouse/significant other   Dressing Independent   Grooming Independent   Feeding Independent   Bathing Independent   Toileting Independent   Adult Protective Services (APS) involved? No   Discharge Planning   Support Systems Spouse/significant other;Parent   Patient expects to be discharged to: home with HHC vs SNF   Mode of transportation: Private car (family member)   Consults/Providers   PT Evaluation Needed 1   OT Evalulation Needed 1   SLP Evaluation Needed 2   Family and PCP   In case you are admitted, would like family notified? Yes   Important Message from [facility] Notice   Patient received 1st IMM Letter? n/a

## 2019-04-05 NOTE — OT Eval Note (Signed)
[facility]  [address]  [place], [place] [address]  [phone]    Occupational Therapy Evaluation    Patient: Jordan [name] MRN: [id number]   Unit: 3B ONCOLOGY GENERAL MEDICINE Bed: Y865/H846-96    Time of treatment: Time Calculation  OT Received On: 04/05/19  Start Time: 1020  Stop Time: 1031  Time Calculation (min): 11 min    Consult received for Jordan [name] for OT evaluation and treatment.  Patient's medical condition is appropriate for Occupational Therapy  intervention at this time.    Interpreter utilized: no, not indicated    D/C Suggestions   Based on today's performance, recommended discharge is to SNF.  If recommended d/c disposition is not available, patient will need home with 24/7 assist, HHOT.  Transport Recommendations: Publishing copy recommended for discharge: No equipment needs    Discharge recommendations are based on patient's progression/regression. Please see most recent note for updated discharge recommendations.    Assessment     Jordan [name] is a 79 y.o. male admitted 04/04/2019 from home with generalized fatigue x2 weeks as well as shuffling gait and trouble with ambulation and fall. At baseline, pt lives at home with his wife and reports mod I with Ambulation and toileting, otherwise needs assist with other ADL. On eval, pt is seated at EOB with PT. Pt reports pain with R UE, which he attributes to his covid shot. Pt req total A for LB dressing, mod A to return to bed. Pt left with needs in place.      Extensive chart review completed including review of labs, review of imaging, review of vitals and review of past hospitalizations.  Pt's ability to complete ADLs and functional transfers is impaired due to the following deficits: decreased activity tolerance, decreased balance, decreased bed mobility, decreased coordination, gait impairment, decreased insight, decreased judgment, decreased memory, decreased safety awareness, decreased problem  solving, decreased strength and transfers .  Pt demonstrates performance deficits with grooming, bathing, dressing, toileting and functional mobility. There are many comorbidities or other factors that affect plan of care and require modification of task including: anxiety, assistive device needed for mobility, dementia, frequent falls, visual deficits and home alone for a portion of the day.  Pt would continue to benefit from OT to address these deficits and increase functional independence.          Complexity Chart Review Performance Deficits Clinical Decision Making Hx/Comorbidities Assistance needed   High Extensive 5 or more Multiple options 3 or more Max/dependent (not at baseline       PMP - Progressive Mobility Protocol   PMP Activity: Step 4 - Dangle at Bedside      Rehabilitation Potential: good for goals    Interdisciplinary Communication: discussed with PT/RN    Plan     OT Plan  Risks/Benefits/POC Discussed with Pt/Family: With patient  Treatment Interventions: ADL retraining;Functional transfer training;UE strengthening/ROM;Endurance training;Patient/Family training;Equipment eval/education;Compensatory technique education  Discharge Recommendation: SNF  DME Recommended for Discharge: Patient already has needed equipment  OT Frequency Recommended: 3-4x/wk         Medical Diagnosis: Weakness [R53.1]  Acute cystitis without hematuria [N30.00]  Leukocytosis, unspecified type [D72.829]    History of Present Illness: Jordan [name] is a 79 y.o. 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  Patient also had reported shuffling gait and trouble with ambulation which was new for him today.  Per his wife's report, the patient is been having on and off fevers, however no  constitutional symptoms.  Patient denies any dysuria, nausea, vomiting.  Patient otherwise not have any other reported symptoms.    Arrival to the emergency room, the patient was found to be afebrile however did have a slightly elevated temperature at 100.1 F, heart rate was 67, respiratory rate was 18, blood pressure was 150/66 and the patient was saturating at 96% on room air.  Patient's initial lab work-up was notable for leukocytosis with a left shift.  Patient also had noted macrocytosis which is part of his medical history.  No thrombocytopenia, or other significant cell line abnormalities.  Patient had a complete metabolic panel obtained revealing a mildly elevated alkaline phosphatase, however remainder of the CMP was largely unremarkable apart from a slightly elevated bilirubin at 1.3.  Baseline bilirubin is closer to 0.8.  Patient denies any abdominal pain.  A UA was obtained showing pyuria, proteinuria, and hematuria.  There were 6-10 squamous epithelial cells.  Patient had extensive imaging in the emergency room including a CT of the head which did not reveal any acute intracranial abnormalities, however did have findings suggestive of chronic vascular disease.  Chest x-ray was performed showing bilateral healed rib fractures without any evidence of acute intrathoracic pathology.  Plain films were obtained of the right hip and pelvis revealing a cortical irregularity involving the right lateral greater trochanteric area consistent with degenerative enthesopathy.  Plain films of the right shoulder also obtained revealing arthritic abnormalities, these were also reviewed and plain films of the right knee.  Patient was to be admitted to [facility], however due to bed weight patient was admitted to this medical facility"      Patient Active Problem List   Diagnosis    Weakness    Benign paroxysmal positional vertigo    CAD (coronary artery disease)    Gait abnormality    GERD (gastroesophageal reflux  disease)    Gout    Hardening of the aorta (main artery of the heart)    History of total left hip replacement    History of total right hip replacement    HTN (hypertension)    Hx of CABG    Hx of tongue cancer    Hypercholesterolemia    Macrocytic anemia    Osteoporosis    Spinal stenosis of lumbar region    Pyuria    Serum total bilirubin elevated     Past Medical History:   Diagnosis Date    Arthritis     Hypertension     Myocardial infarct     Sleep apnea      Past Surgical History:   Procedure Laterality Date    CARDIAC SURGERY      EYE SURGERY      HERNIA REPAIR      JOINT REPLACEMENT      TONGUE SURGERY           X-Rays/Tests/Labs:  Lab Results   Component Value Date/Time    HGB 9.6 (L) 04/05/2019 05:31 AM    HCT 29.0 (L) 04/05/2019 05:31 AM    K 3.6 04/05/2019 05:31 AM    NA 141 04/05/2019 05:31 AM    INR 1.0 04/06/2006 11:40 AM    TROPI 0.02 04/04/2019 04:07 PM    TROPI 0.02 03/28/2006  10:15 AM    TROPI 0.02 03/28/2006 04:40 AM    TROPI <0.08 11/11/2005 08:20 PM    TROPI <0.08 10/16/2005 01:30 AM     Shoulder Right 2+ Views (Order: [id number]) - 04/04/2019  IMPRESSION:     Advanced changes of the shoulder compatible degenerative  and/or inflammatory arthritis    Knee 4+ Views Right (Order: [id number]) - 04/04/2019  IMPRESSION:     Tricompartmental osteoarthritis    Hip Right AP and Lateral with Pelvis (Order: [id number]) - 04/04/2019  IMPRESSION:     Good position of the joint prothesis. No displaced fracture  or dislocation. Slight cortical irregularity of the right lateral  greater trochanteric most likely represents degenerative enthesopathy  and/or overlying postsurgical calcification. If the patient is focally  tender over the right hip this could represent a nondisplaced fracture  on a background of osteopenia    CT Head without Contrast (Order: [id number]) - 04/04/2019  IMPRESSION:   1.  No abnormal mass or acute intracranial hemorrhage is identified.  2.  Areas of relative  hypodensity are seen in the white matter of the  cerebral hemispheres. This is a nonspecific finding. It may be related  to chronic ischemic change from small vessel disease.    XR Chest AP Portable (Order: [id number]) - 04/04/2019  IMPRESSION:    Bilateral healed rib fractures are present which have  occurred when compared to 2008. No acute infiltrate. Mild chronic  interstitial changes    Social History:  [employer] with wife in a house.  Entry Steps: ramp    Inside steps: stair glide   Equipment at home:  rolling walker, rollator, tub/shower combination and sliding tub transfer bench  Prior Level of Function:     Cognition: forgetful, word finding difficulities    Mobility/Locomotion: mod indep with rollator on main level and RW on upper level   Feeding: set up   Grooming: mod indep   Bathing: assist for transfer   Dressing: assist due to R shoulder pain   Toileting: mod indep    Subjective   "she takes good care of me"  Patient is agreeable to participation in the therapy session. Nursing clears patient for therapy.  Patient's Goal:  None stated  Pain: pt denies pain    Objective     Precautions:   Precautions  Weight Bearing Status: no restrictions  Other Precautions: falls, HOH    Patient is seated at edge of bed with PT with  Telemetry, Intravenous Access  in place.     Observation of patient/vitals:   Vitals:    04/05/19 0112 04/05/19 0542 04/05/19 0735 04/05/19 0920   BP: 136/66 119/63 138/69 112/62   Pulse: 63 62 74 76   Resp: 18 18 18     Temp: 97.9 F (36.6 C) 98.2 F (36.8 C) 97.3 F (36.3 C)    TempSrc: Oral Oral Oral    SpO2: 97% 96% 95%    Weight: 59.1 kg (130 lb 4.7 oz)      Height: 1.753 m (5\' 9" )          Orientation/Cognition:     Alert and Oriented x 4  Cognition: follows all commands with increased time      Musculoskeletal Examination:   ROM Strength   RUE WFL distally, shld limited by arthritis WFL   LUE WFL distally, shld limited by arthritis [facility]     Sensation: Intact to light touch, denies  N/T throughout B UE's.   Coordination: Intact gross motor  and serial opposition to B hands    Vision: WFL  Hearing: HOH      Functional Mobility:  Scooting: mod A to center of bed  Sit to Supine: mod A for BLE    Balance:  Static Sit Balance: fair   Dynamic Sit Balance: fair     Self Care:  Grooming: set-up to wash face at EOB  LB Dressing: max A socks    Endurance: good    Participation:  good    Education:  Educated the patient to role of occupational therapy, plan of care, goals  of therapy and safety with mobility and ADLs.    RN notified of session outcome and that patient was left in bed with all needs met and equipment intact.   Safety measures include: handoff to nurse/clin tech/ unit secretary completed, bed alarm activated, oriented to call bell and placed within reach, personal items within reach, assistive device positioned out of reach and bed placed in lowest position.   Mobility and ADL status posted at bedside and within E.M.R.                Goals:  Goals  Goal Formulation: Patient  Time For Goal Achievement: 5 visits  Goals: Select goal  Patient will groom self: Supervision  Patient will dress lower body: Moderate Assist  Patient will toilet: Minimal Assist  Pt will perform functional transfers: Minimal Assist        Signature:   Jibran Crookshanks, OTR/L  04/05/2019  2:11 PM  Phone [phone]    (For scheduling questions, please contact rehab tech [phone])    Therapist PPE during session procedural mask, goggles  and gloves

## 2019-04-05 NOTE — ED Notes (Signed)
After 3.5 hours of waiting for bed assignment at [facility], spoke to Dr. Maxwell Caul on call [place].  He doesn't think that patient will be getting at bed at [facility] soon.  Authorization given to admit patient her.  [id number]  Dr. Glenetta Hew, hospitalist, accepts for obs medical admission.     Dora Sims, FNP  04/05/19 0013       Genia Hotter, MD  04/09/19 2136

## 2019-04-05 NOTE — ED Notes (Signed)
[facility]  ED NURSING NOTE FOR THE RECEIVING INPATIENT NURSE   ED NURSE Patrici Ranks [phone]   ED CHARGE RN Cait   ADMISSION INFORMATION   Jordan [name] is a 79 y.o. male admitted with an ED diagnosis of:    1. Weakness    2. Leukocytosis, unspecified type    3. Acute cystitis without hematuria         Isolation: None   Allergies: Iodine   Holding Orders confirmed? Yes   Belongings Documented? Yes   Home medications sent to pharmacy confirmed? N/A   NURSING CARE   Patient Comes From:   Mental Status: Home/Family Care  alert and oriented   ADL: Dependent with ADLs   Ambulation: Unable to assess   Pertinent Information  and Safety Concerns: none     CT / NIH   CT Head ordered on this patient?  No   NIH/Dysphagia assessment done prior to admission? N/A   VITAL SIGNS (at the time of this note)      Vitals:    04/05/19 0001   BP: 115/56   Pulse: (!) 55   Resp: 18   Temp: 98.5 F (36.9 C)   SpO2: 97%

## 2019-04-05 NOTE — H&P (Signed)
ADMISSION HISTORY AND PHYSICAL EXAM    [facility], DIVISION OF HOSPITALIST MEDICINE   [facility]   [place] Pager: [phone]      Date Time: 04/05/19 1:04 AM  Patient Name: Jordan [name] A  Attending Physician: Theone Murdoch, DO  Primary Care Physician: Pcp, None, MD    CC: Generalized fatigue    Assessment:     Active Hospital Problems    Diagnosis    Weakness     Priority: High    Pyuria     Priority: High    Serum total bilirubin elevated     Priority: Medium    Osteoporosis     Priority: Medium    Gait abnormality     Priority: Medium    History of total left hip replacement     Priority: Medium    History of total right hip replacement     Priority: Medium    Macrocytic anemia     Priority: Low    Hx of CABG     Priority: Low    Hypercholesterolemia     Priority: Low    CAD (coronary artery disease)           Plan:   Generalized fatigue  -Etiology at this point is not entirely clear.  Could potentially be infectious given leukocytosis, and slightly elevated temperature on arrival.  SARS COV 2 screening has been ordered, and pending.  Given prior history of prosthetic material within the hip, plan to obtain ESR/CRP.  UA is not consistent with UTI, is likely more representative of pyuria without bacteriuria.  -Follow-up SARS COV 2 screening  -Follow-up blood cultures x2  -Hold off on further antibiotics for now  -Vitals per unit protocol  -PT/OT evaluation  -Consider CT imaging of the right hip to further evaluate potential for fracture.  Will await patient's ambulation with PT.  -Repeat LFTs given slightly elevated total bilirubin.  Will get a fractionated bilirubin.    Chronic medical conditions for which home meds will be resumed:  -History of CAD/hyperlipidemia-resume home aspirin 81 mg daily, metoprolol tartrate 50 mg twice daily, and Lipitor.  -History of gout-resume home colchicine 0.6 mg daily, repeat BMP  -History of gastroesophageal reflux disease-resume home  famotidine 20 mg twice daily      Disposition:     Anticipated medical stability for discharge: Likely within 24 to 48 hours  Service status: Observation    History of Presenting Illness:   Jordan [name] is a 79 y.o. 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  Patient also had reported shuffling gait and trouble with ambulation which was new for him today.  Per his wife's report, the patient is been having on and off fevers, however no constitutional symptoms.  Patient denies any dysuria, nausea, vomiting.  Patient otherwise not have any other reported symptoms.    Arrival to the emergency room, the patient was found to be afebrile however did have a slightly elevated temperature at 100.1 F, heart rate was 67, respiratory rate was 18, blood pressure was 150/66 and the patient was saturating at 96% on room air.  Patient's initial lab work-up was notable for leukocytosis with a left shift.  Patient also had noted macrocytosis which is part of his medical history.  No thrombocytopenia, or  other significant cell line abnormalities.  Patient had a complete metabolic panel obtained revealing a mildly elevated alkaline phosphatase, however remainder of the CMP was largely unremarkable apart from a slightly elevated bilirubin at 1.3.  Baseline bilirubin is closer to 0.8.  Patient denies any abdominal pain.  A UA was obtained showing pyuria, proteinuria, and hematuria.  There were 6-10 squamous epithelial cells.  Patient had extensive imaging in the emergency room including a CT of the head which did not reveal any acute intracranial abnormalities, however did have findings suggestive of chronic vascular disease.  Chest x-ray was performed showing bilateral healed rib fractures without any evidence of acute  intrathoracic pathology.  Plain films were obtained of the right hip and pelvis revealing a cortical irregularity involving the right lateral greater trochanteric area consistent with degenerative enthesopathy.  Plain films of the right shoulder also obtained revealing arthritic abnormalities, these were also reviewed and plain films of the right knee.  Patient was to be admitted to [facility], however due to bed weight patient was admitted to this medical facility.    Past Medical History:     Past Medical History:   Diagnosis Date    Hypertension     Myocardial infarct        Available old records reviewed, including: EPIC     Past Surgical History:     Past Surgical History:   Procedure Laterality Date    EYE SURGERY      HERNIA REPAIR      JOINT REPLACEMENT      TONGUE SURGERY         Family History:   History reviewed. No pertinent family history.    Social History:    reports that he has never smoked. He has never used smokeless tobacco. He reports that he does not drink alcohol or use drugs.    Allergies:     Allergies   Allergen Reactions    Iodine        Medications:     Home Medications     Med List Status: In Progress Set By: Tamala Julian, RN at 04/04/2019  3:55 PM                alendronate (FOSAMAX) 70 MG tablet     Take 70 mg by mouth every 7 days.Take 1 tab by mouth every 7 days with a full glass of water on an empty stomach. Do not take anything else by mouth or lie down for 30 mins.     aspirin 81 MG chewable tablet     Chew 81 mg by mouth daily.     atorvastatin (LIPITOR) 40 MG tablet     Take 40 mg by mouth daily.     colchicine 0.6 MG tablet     Take 0.6 mg by mouth daily.     famotidine (PEPCID) 20 MG tablet          metoprolol (LOPRESSOR) 50 MG tablet     Take 50 mg by mouth 2 (two) times daily.     raNITIdine (ZANTAC) 150 MG tablet     Take 150 mg by mouth 2 (two) times daily.            Review of Systems:   All other systems were reviewed and are negative except:as above in HPI     Physical  Exam:     Patient Vitals for the past 24 hrs:   BP Temp Temp src Pulse Resp SpO2 Weight  04/05/19 0001 115/56 98.5 F (36.9 C) Oral (!) 55 18 97 %    04/04/19 2223 107/53   (!) 57 13 96 %    04/04/19 1900 121/59   66 20 95 %    04/04/19 1849 118/56 98.5 F (36.9 C)  70 18 96 %    04/04/19 1830 107/63   70 (!) 26 96 %    04/04/19 1800 109/53   (!) 59 (!) 27 96 %    04/04/19 1730 110/52   63 (!) 38 96 %    04/04/19 1547 150/66 100.1 F (37.8 C) Oral 67 18 96 % 65 kg (143 lb 4.8 oz)     Body mass index is 21.16 kg/m.  No intake or output data in the 24 hours ending 04/05/19 0104    General: awake; in no acute distress.  HEENT: perrla, eomi, sclera anicteric  oropharynx clear without lesions, mucous membranes moist  Neck: supple, no lymphadenopathy, no JVD, no carotid bruits  Cardiovascular: Patient with a systolic murmur best appreciated the aortic listening post, no rubs or gallops  Lungs: clear to auscultation bilaterally, without wheezing, rhonchi, or rales  Abdomen: soft, non-tender, non-distended; no palpable masses, no hepatosplenomegaly, normoactive bowel sounds, no rebound or guarding  Extremities: no clubbing, cyanosis, or edema  Neuro: Patient is alert, oriented to person, place, and time.  Patient has some difficulty in completing sentences which is reportedly chronic.  All extremities show equal strength bilaterally in the upper and lower aspects.        Labs:     Results     Procedure Component Value Units Date/Time    Blood Culture Aerobic and Anaerobic (age 66 or older) [[id number]] Collected: 04/04/19 1659    Specimen: Blood, Venipuncture Updated: 04/04/19 2310    Narrative:      1 BLUE+1 PURPLE    Blood Culture Aerobic and Anaerobic (age 62 or older) [[id number]] Collected: 04/04/19 1732    Specimen: Blood, Venipuncture Updated: 04/04/19 2310    Narrative:      1 BLUE+1 PURPLE    COVID-19 (SARS-COV-2) [facility] Rapid) [[id number]] Collected: 04/04/19 1732    Specimen: Nasopharyngeal Swab  from Nasopharynx Updated: 04/04/19 1817     Purpose of COVID testing Screening     SARS-CoV-2 Specimen Source Nasopharyngeal     SARS CoV 2 Overall Result Negative    Narrative:      o Collect and clearly label specimen type:  o Upper respiratory specimen: One Nasopharyngeal Dry Swab NO  Transport Media.  o Hand deliver to laboratory ASAP  Indication for testing->Extended care facility admission to  semi private room    UA Reflex to Micro - Reflex to Culture [[id number]]  (Abnormal) Collected: 04/04/19 1659    Specimen: Urine, Clean Catch Updated: 04/04/19 1747     Urine Type Urine, Clean Ca     Color, UA Yellow     Clarity, UA Sl Cloudy     Specific Gravity UA 1.019     Urine pH 6.0     Leukocyte Esterase, UA Small     Nitrite, UA Negative     Protein, UR 100     Glucose, UA Negative     Ketones UA Negative     Urobilinogen, UA 2.0 mg/dL      Bilirubin, UA Negative     Blood, UA Small     RBC, UA 6 - 10 /hpf      WBC, UA 6 - 10 /hpf  Squamous Epithelial Cells, Urine 6 - 10 /hpf     Narrative:      Replace urinary catheter prior to obtaining the urine culture  if it has been in place for greater than or equal to 14  days:->N/A No Foley  Indications for U/A Reflex to Micro - Reflex to  Culture:->Suprapubic Pain/Tenderness or Dysuria    Lactic Acid [[id number]] Collected: 04/04/19 1659    Specimen: Blood Updated: 04/04/19 1716     Lactic Acid 2.0 mmol/L     Troponin I [[id number]] Collected: 04/04/19 1607    Specimen: Blood Updated: 04/04/19 1648     Troponin I 0.02 ng/mL     Comprehensive metabolic panel [[id number]]  (Abnormal) Collected: 04/04/19 1607    Specimen: Blood Updated: 04/04/19 1640     Glucose 155 mg/dL      BUN 18 mg/dL      Creatinine 1.2 mg/dL      Sodium 295 mEq/L      Potassium 4.8 mEq/L      Chloride 100 mEq/L      CO2 31 mEq/L      Calcium 9.8 mg/dL      Protein, Total 6.8 g/dL      Albumin 3.5 g/dL      AST (SGOT) 24 U/L      ALT 24 U/L      Alkaline Phosphatase 125 U/L      Bilirubin, Total 1.3  mg/dL      Globulin 3.3 g/dL      Albumin/Globulin Ratio 1.1     Anion Gap 9.0    GFR [[id number]] Collected: 04/04/19 1607     Updated: 04/04/19 1640     EGFR 58.6    CBC and differential [[id number]]  (Abnormal) Collected: 04/04/19 1607    Specimen: Blood Updated: 04/04/19 1622     WBC 22.49 x10 3/uL      Hgb 11.1 g/dL      Hematocrit 44.0 %      Platelets 167 x10 3/uL      RBC 3.12 x10 6/uL      MCV 108.0 fL      MCH 35.6 pg      MCHC 32.9 g/dL      RDW 18 %      MPV 10.8 fL      Neutrophils 88.2 %      Lymphocytes Automated 2.3 %      Monocytes 8.6 %      Eosinophils Automated 0.0 %      Basophils Automated 0.2 %      Immature Granulocytes 0.7 %      Nucleated RBC 0.1 /100 WBC      Neutrophils Absolute 19.82 x10 3/uL      Lymphocytes Absolute Automated 0.52 x10 3/uL      Monocytes Absolute Automated 1.93 x10 3/uL      Eosinophils Absolute Automated 0.01 x10 3/uL      Basophils Absolute Automated 0.05 x10 3/uL      Immature Granulocytes Absolute 0.16 x10 3/uL      Absolute NRBC 0.02 x10 3/uL         EKG-normal sinus rhythm.  Incomplete right bundle branch block.  Imaging personally reviewed, including: all available   Knee 4+ Views Right    Result Date: 04/04/2019    Tricompartmental osteoarthritis Laurena Slimmer, MD  04/04/2019 8:22 PM    Ct Head Without Contrast    Result Date: 04/04/2019  1.  No abnormal mass or acute  intracranial hemorrhage is identified. 2.  Areas of relative hypodensity are seen in the white matter of the cerebral hemispheres. This is a nonspecific finding. It may be related to chronic ischemic change from small vessel disease. Tana Felts, MD  04/04/2019 5:29 PM    Hip Right Ap And Lateral With Pelvis    Result Date: 04/04/2019    Good position of the joint prothesis. No displaced fracture or dislocation. Slight cortical irregularity of the right lateral greater trochanteric most likely represents degenerative enthesopathy and/or overlying postsurgical calcification. If the patient is focally tender  over the right hip this could represent a nondisplaced fracture on a background of osteopenia Laurena Slimmer, MD  04/04/2019 8:13 PM    Shoulder Right 2+ Views    Result Date: 04/04/2019    Advanced changes of the shoulder compatible degenerative and/or inflammatory arthritis Laurena Slimmer, MD  04/04/2019 8:18 PM    Xr Chest  Ap Portable    Result Date: 04/04/2019   Bilateral healed rib fractures are present which have occurred when compared to 2008. No acute infiltrate. Mild chronic interstitial changes Kinnie Feil, MD  04/04/2019 4:43 PM      Safety Checklist  DVT prophylaxis:  CHEST guideline (See page e199S) [employer] and Mechanical     This note was generated by the Epic EMR system/ Dragon speech recognition and may contain inherent errors or omissions not intended by the user. Grammatical errors, random word insertions, deletions and pronoun errors  are occasional consequences of this technology due to software limitations. Not all errors are caught or corrected. If there are questions or concerns about the content of this note or information contained within the body of this dictation they should be addressed directly with the author for clarification.    Signed by: Theone Murdoch, DO  cc:Pcp, None, MD

## 2019-04-05 NOTE — Progress Notes (Signed)
04/05/2019  ED with progressive generalized weakness x2 weeks and shuffling gait and trouble with ambulation that started today.  OBS, DCP: home with wife, HHC.

## 2019-04-05 NOTE — Plan of Care (Signed)
Pt seen and examined at bedside  Spoke with pt and his wife  Pt received COVID-19 vaccine on 1/17,developed some arm pain after,but a week later noted right shoulder pain with reduced ROM  Then yesterday pt was trying to go to bathroom and felt very week and wife had to help him to the floor, no head trauma and no LOC.    On arrival pt had temp of 100.1 and WBC of 22.49  CXR no active disease  CT head no acute IC abnormalities  Xray right hip no fx or dislocation   Xray right knee Tricompartmental osteoarthritis  Xray right shoulder Advanced changes of the shoulder compatible degenerative  and/or inflammatory arthritis    On exam pt has swelling, tenderness and reduced ROM of right shoulder and it is warm to touch  No right hip pain and moves it w/o restriction    DDX: either Right shoulder gout vs. bursitis vs. infective arthritis  CRP 19.9, ESR 78  WBC 13 this am,he received a dose of rocephin in ED    Will hold off abx ,may need to have joint aspirated before giving abx  Ortho consult (discussed with Dr.Nagda, Dr.Narvaez will see pt in am)  Will start him on naproxen 375 mg q12h (renally dosed) x5 days in case gout  Check uric acid  He is on colchicine,but seems he had not been taking it for awhile   Follow up bcx and ucx (ucx sent today after ED Abx)  COVID-19 neg      Hypophosphatemia, replace phos  Chronic anemia and TCP,monitor CBC      DVT UEA:VWUJ hold off Lovenox in case he needs to have shoulder tap    Case was discussed with pt and his wife and RN and ortho is consulted to see the pt in am

## 2019-04-06 DIAGNOSIS — M109 Gout, unspecified: Principal | ICD-10-CM

## 2019-04-06 LAB — CBC
Absolute NRBC: 0 10*3/uL (ref 0.00–0.00)
Hematocrit: 30.3 % — ABNORMAL LOW (ref 37.6–49.6)
Hgb: 9.9 g/dL — ABNORMAL LOW (ref 12.5–17.1)
MCH: 35.5 pg — ABNORMAL HIGH (ref 25.1–33.5)
MCHC: 32.7 g/dL (ref 31.5–35.8)
MCV: 108.6 fL — ABNORMAL HIGH (ref 78.0–96.0)
MPV: 11.5 fL (ref 8.9–12.5)
Nucleated RBC: 0 /100 WBC (ref 0.0–0.0)
Platelets: 126 10*3/uL — ABNORMAL LOW (ref 142–346)
RBC: 2.79 10*6/uL — ABNORMAL LOW (ref 4.20–5.90)
RDW: 18 % — ABNORMAL HIGH (ref 11–15)
WBC: 10.31 10*3/uL — ABNORMAL HIGH (ref 3.10–9.50)

## 2019-04-06 LAB — ECG 12-LEAD
Atrial Rate: 67 {beats}/min
P Axis: 88 degrees
P-R Interval: 198 ms
Q-T Interval: 392 ms
QRS Duration: 102 ms
QTC Calculation (Bezet): 414 ms
R Axis: 32 degrees
T Axis: 48 degrees
Ventricular Rate: 67 {beats}/min

## 2019-04-06 LAB — BODY FLUID CELL COUNT
Body Fluid Lymphocytes: 9 %
Body Fluid Monocyte/Macrophage Cells: 21 %
Body Fluid Polymorphonuclear Cell: 70 % — ABNORMAL HIGH (ref <25)
Body Fluid RBC: 12000 /mm3
Body Fluid WBC: 5495 /mm3

## 2019-04-06 LAB — BASIC METABOLIC PANEL
Anion Gap: 7 (ref 5.0–15.0)
BUN: 20 mg/dL (ref 9–28)
CO2: 29 mEq/L (ref 22–29)
Calcium: 8.6 mg/dL (ref 7.9–10.2)
Chloride: 106 mEq/L (ref 100–111)
Creatinine: 0.8 mg/dL (ref 0.7–1.3)
Glucose: 86 mg/dL (ref 70–100)
Potassium: 4.3 mEq/L (ref 3.5–5.1)
Sodium: 142 mEq/L (ref 136–145)

## 2019-04-06 LAB — BODY FLUID CRYSTAL

## 2019-04-06 LAB — PHOSPHORUS: Phosphorus: 2.4 mg/dL (ref 2.3–4.7)

## 2019-04-06 LAB — GFR: EGFR: >60

## 2019-04-06 LAB — URIC ACID: Uric acid: 5.8 mg/dL (ref 3.6–9.7)

## 2019-04-06 MED ORDER — ENOXAPARIN SODIUM 40 MG/0.4ML SC SOLN
40.00 mg | SUBCUTANEOUS | Status: DC
Start: 2019-04-07 — End: 2019-04-08
  Administered 2019-04-07: 09:00:00 40 mg via SUBCUTANEOUS
  Filled 2019-04-06: qty 0.4

## 2019-04-06 NOTE — Progress Notes (Signed)
Referrals initiated to skilled facilities in network with [facility]. Jordan [name] has accepted and will have bed available over the weekend. Jordan [name] and Jordan [name] agreeable.  Jordan [name] [facility] [phone] will arrange transport to [facility].

## 2019-04-06 NOTE — Progress Notes (Signed)
MEDICINE PROGRESS NOTE  [facility], DIVISION OF HOSPITALIST MEDICINE    [facility]   [place] Pager: [phone]      Date Time: 04/06/19 10:56 PM  Patient Name: [name] A  Attending Physician: Richarda Osmond, MD  Hospital Day: 3  Assessment:   56 y old male with PMHx of CAD, HLD,GERD,h/o gout who received COVID-19 vaccine on 1/17 in right arm,and he developed some arm pain after,but a week later noted right shoulder pain with reduced ROM. Then on the day of ED admission, pt was trying to go to the bathroom and felt very weak and wife had to help him to the floor, no head trauma and no LOC.On arrival to ED,pt had temp of 100.1 and WBC of 22.49.bcx,ucx were done and he was given a dose of rocephin in ED and was admitted to the medicine.    CXR no active disease  CT head no acute IC abnormalities  Xray right hip no fx or dislocation   Xray right knee Tricompartmental osteoarthritis  Xray right shoulder Advanced changes of the shoulder compatible degenerative and/or inflammatory arthritis  COVID-19 neg    Plan:     DDX:  Right shoulder gout vs. infectious arthritis  CRP 19.9, ESR 78  WBC 22-->13-->10 today  Uric acid wnl  He used to be on colchicine and he was not taking it for awhile  bcx in lab,follow up result  ucx neg  Started him on Naproxen 375 mg po bid x5 days  Consulted ortho and s/p aspiration of Right shoulder,doubt infectious arthritis, likely due to gout  Follow up cx,hold off abx for now as he is afebrile now and WBC has decreased   PT/OT,needs NSF    Hypophosphatemia, replaced phos and resolved    Chronic anemia and TCP,monitor CBC    H/o GERD:  -c/w Pepcid and Carafate    H/o HLD: c/w statin     H/o CAD: c/w metoprolol and statin and statin    DVT HYQ:MVHQI the pt on lovenox      Case was discussed with pt and his wife and RN and ortho and case manager    Pt needs SNF,referral is made,    Lines:     Patient Lines/Drains/Airways Status    Active PICC Line / CVC Line / PIV Line  / Drain / Airway / Intraosseous Line / Epidural Line / ART Line / Line / Wound / Pressure Ulcer / NG/OG Tube     Name:   Placement date:   Placement time:   Site:   Days:    Peripheral IV 04/04/19 18 G Left Antecubital   04/04/19    1601    Antecubital   2    Peripheral IV 04/04/19 20 G Anterior;Right Forearm   04/04/19    1600    Forearm   2    Urethral Catheter    10/14/15    0149    --   1270                 Subjective/ROS/24 hr events:     CC: Weakness    Pt seen and examined at bedside in am  Denied any CP or SOB,felt a bit better today and able to move his right shoulder more,no fever,no N/V/D,no urinary symptoms   Later on he had right shoulder joint aspiration    Physical Exam:     Temp:  [97.5 F (36.4 C)-98.1 F (36.7 C)] 98.1 F (36.7  C)  Heart Rate:  [58-71] 58  Resp Rate:  [16-17] 16  BP: (108-147)/(53-79) 111/57  Intake and Output Summary-last 24 Hrs:  I/O last 3 completed shifts:  In: 360 [P.O.:360]  Out: 850 [Urine:850]    No data recorded by O2 Device: None (Room air)     General: AAOx3,in no acute distress  Cardiovascular: regular rate and rhythm, no murmurs  Lungs: clear to auscultation bilaterally, no additional sounds  Abdomen: soft, non-tender, non-distended; normoactive bowel sounds  Extremities: +pain and swelling right shoulder and reduced ROM,however,it is much better than yesterday,less warmth to touch  Neurological: Alert and oriented X 3, moves all extremities with reduced ROM of b/l shoulder R>>L       Meds:   Medications were reviewed:  Scheduled Meds:  Current Facility-Administered Medications   Medication Dose Route Frequency    aspirin  81 mg Oral Daily    atorvastatin  40 mg Oral Daily    colchicine  0.6 mg Oral Daily    famotidine  20 mg Oral Daily    metoprolol tartrate  50 mg Oral BID    naproxen  375 mg Oral BID Meals     Continuous Infusions:  PRN Meds:.naloxone  Labs/Radiology:   Imaging personally reviewed, including: all available   No results found.  No results  for input(s): GLUCOSEWB in the last 24 hours.  Recent Labs   Lab 04/06/19  0637 04/05/19  0531 04/04/19  1607   Sodium 142 141 140   Potassium 4.3 3.6 4.8   Chloride 106 103 100   BUN 20 17 18    Creatinine 0.8 0.8 1.2   EGFR >60.0 >60.0 58.6   Glucose 86 82 155*   Calcium 8.6 8.7 9.8     Recent Labs   Lab 04/06/19  0637 04/05/19  0531 04/04/19  1607   WBC 10.31* 13.66* 22.49*   Hgb 9.9* 9.6* 11.1*   Hematocrit 30.3* 29.0* 33.7*   Platelets 126* 131* 167         Recent Labs   Lab 04/05/19  0531 04/04/19  1607   Alkaline Phosphatase 102 125*   Bilirubin, Total 0.9 1.3*   Bilirubin Direct 0.5  --    ALT 23 24   AST (SGOT) 23 24     Recent Labs   Lab 04/05/19  0531   C-Reactive Protein 19.9*         Signed by: Richarda Osmond, MD

## 2019-04-06 NOTE — PT Progress Note (Signed)
[facility]  [address]  [place] [address]  [phone]    Physical Therapy Treatment    Patient:  Jordan [name]        MRN#:  [id number]  Unit:  3B ONCOLOGY GENERAL MEDICINE        Room/Bed:  A213/Y865-78    Medical Diagnosis: Weakness [R53.1]  Acute cystitis without hematuria [N30.00]  Leukocytosis, unspecified type [D72.829]    Time of treatment:  PT Received On: 04/06/19  Start Time: 1407 Stop Time: 1453  Time Calculation (min): 46 min    Treatment #: PT Visit Number: 1/5    Patient's medical condition is appropriate for Physical Therapy intervention at this time.    Interpreter utilized: no, not indicated    Assessment   Pt was received supine in bed with wife present at bedside. Pt required mod A to EOB and progressed to only requiring min a to stand with bed elevated. Pt ambulated an increased distance to 73' with RW with shuffling gait with min/CGA. Pt's wife reports that his gait is close to baseline but he typically can get OOB/transfer independently. Pt completed toileting with assistance for pericare and very hard stool noted. Pt was left back in bed. Pt also with urinary incontinence noted and brief changed with max A.     PMP - Progressive Mobility Protocol   PMP Activity: Step 7 - Walks out of Room  Distance Walked (ft) (Step 6,7): 80 Feet     Plan   Based on today's performance:  Recommendation  Discharge Recommendation: SNF  DME Recommended for Discharge: Patient already has needed equipment  PT Frequency: 3-4x/wk      Transport Recommendations: stretcher transport    Discharge recommendations are based on patient's progression/regression. Please see most recent note for updated discharge recommendations.    Continue plan of care.    Interdisciplinary Communication: RN      Subjective   "I am ready to go to rehab"  Patient is agreeable to participation in the therapy session. Family and/or guardian are agreeable to patient's participation in the therapy session. Nursing  clears patient for therapy.  Pain: Pt unable to quantify  Location: mild R shoulder pain  Therapist Intervention: positioned for comfort  Patient is satisfied with therapist intervention.    Vitals:   Vitals:    04/05/19 1900 04/05/19 2043 04/06/19 0500 04/06/19 0829   BP: 112/67 103/57 147/79 108/53   Pulse: 66 62 62 71   Resp: 17  17 17    Temp: 97.9 F (36.6 C)  97.5 F (36.4 C) 97.5 F (36.4 C)   TempSrc: Oral  Oral Axillary   SpO2: 93%  96% 94%   Weight:       Height:           Objective     Precautions/ Contraindications:   Precautions  Weight Bearing Status: no restrictions  Other Precautions: falls, HOH    Patient is in bed with  Intravenous Access, Sequential Compression Device (SCD) and Bed Alarm in place.        Functional Mobility:  Rolling: mod A   Supine to Sit: mod A   Scooting: min a   Sit to Supine: mod A for BLEs  Sit to Stand: min a from elevated bed, mod A from low chair and BSC  Stand to Sit: mod A to control descent  Transfer Training: mod a    Gait:   WB status: no restrictions  Assistive Device:  RW  Assist Level: min A - CGA  Distance: 80', 15' and 15'  Pattern: shuffling, kyphotic, at times was step to leading with RLE     Self Care:   LB Dressing: max a for brief change  Toileting: max A pericare       Educated the patient to role of physical therapy, plan of care, goals  of therapy, rationale for progressing mobility and safety with mobility and ADLs.    RN notified of session outcome and that patient was left in bed with all needs met and equipment intact.   Safety measures include: handoff to nurse/clin tech/ unit secretary completed, bed alarm activated, oriented to call bell and placed within reach, personal items within reach, assistive device positioned out of reach and bed placed in lowest position.   Mobility and ADL status posted at bedside and within E.M.R.    Goals per Eval/ Re-eval:   Goals  Goal Formulation: With patient  Time for Goal Acheivement: 5 visits  Goals: Select  goal  Pt Will Go Supine To Sit: with supervision  Pt Will Perform Sit to Stand: with supervision  Pt Will Transfer Bed/Chair: with rolling walker, with supervision  Pt Will Ambulate: 51-100 feet, with rolling walker, with supervision      Therapist PPE during session procedural mask, goggles  and gloves     Signature:  Sedonia Small, PT  04/06/2019 3:39 PM   Phone: [phone]     (For scheduling questions, please contact rehab tech [phone])

## 2019-04-06 NOTE — Consults (Signed)
Orthopaedic Surgery Consult Note    I was consulted by Medicine for evaluation of the patient for RIGHT shoulder pain.    Chief Complaint: RIGHT shoulder pain    HPI:  Patient is a 79 y.o. right-hand dominant male who presents with RIGHT shoulder pain that has been ongoing following his first COVID-19 vaccine injection performed on 03/11/2019 into his RIGHT deltoid. The patient states that his RIGHT shoulder has been bothering him since the injection, limiting his motion. He does states the pain is slightly improving since the injection, but still continues to cause him discomfort. The patient reports that he has a history of gout in his right great toe. He has taken colchicine in the past, but has not taken medications for gout in years.    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    History of total right hip replacement 12/31/2011    Spinal stenosis of lumbar region 06/21/2011    Macrocytic anemia 07/06/2007    Benign paroxysmal positional vertigo 11/12/2005    CAD (coronary artery disease) 01/19/2005    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 Do not take anything else by mouth or lie down for 30 mins.   Past Week at Unknown time    aspirin 81 MG chewable tablet Chew 81 mg by mouth daily.   04/04/2019 at Unknown time    atorvastatin (LIPITOR) 40 MG tablet Take 40 mg by mouth daily.   04/04/2019 at Unknown time    colchicine 0.6 MG tablet Take 0.6 mg by mouth daily.   04/04/2019 at Unknown time    famotidine (PEPCID) 20 MG tablet    04/04/2019 at Unknown time    metoprolol (LOPRESSOR) 50 MG tablet Take 50 mg by mouth 2 (two) times daily.   04/04/2019 at Unknown time    raNITIdine (ZANTAC) 150 MG tablet Take 150 mg by mouth 2 (two) times daily.   04/04/2019 at Unknown time     Allergies   Allergen Reactions    Iodine       Social History     Tobacco Use    Smoking status: Never Smoker    Smokeless tobacco: Never Used   Substance Use Topics    Alcohol use: No  History reviewed. No pertinent family history.     FAMILY HISTORY: No family history of VTE or inflammatory arthropathy.    REVIEW OF SYSTEMS:   CONSTITUTIONAL: No weight loss, weakness or fatigue.   SKIN: No rash or itching.   CARDIOVASCULAR: No chest pain, chest pressure or chest discomfort. No edema.   RESPIRATORY: No shortness of breath.   GASTROINTESTINAL: No nausea, vomiting or diarrhea.   NEUROLOGICAL: No paralysis. No change in bowel or bladder control.   MUSCULOSKELETAL: Positive joint pain.   HEMATOLOGIC: No anemia, bleeding or bruising.   ENDOCRINOLOGIC: No polyuria, no polydipsia, heat/cold intolerance.     Objective:  Vital signs in last 24 hours:  Temp:  [97.5 F (36.4 C)-97.9 F (36.6 C)] 97.5 F (36.4 C)  Heart Rate:  [62-71] 71  Resp Rate:  [17] 17  BP: (103-147)/(53-79) 108/53  General: NAD, well-nourished, well-developed.  Psych: Alert and oriented, normal mood.  HEENT: NC/AT  Cardiac: RRR  Respiratory: Non-labored breathing  Skin: intact  Musculoskeletal: RIGHT upper extremity  - Fluctuant fluid  collection palpable in the anterolateral aspect of his shoulder, likely joint effusion  - Minimal discomfort and tenderness to palpation. No erythema.  - Humeral head palpable anterior to the acromion consistent with likely chronic rotator cuff tear  - ROM: forward flexion to 70 degrees actively. Deltoid muscle fires. No pain with mid-range motion.  - Motor intact AIN/PIN/ulnar/axillary nerve distributions  - Sensory intact to light touch median/radial/ulnar/axillary distributions  - Palpable radial pulse, brisk capillary refill < 2 seconds all digits    RIGHT great toe demonstrates tophaceous deposits in the IP joint.    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 High-riding humeral head.    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 No fractures, subluxations, or dislocations.    RIGHT hip and AP pelvis demonstrates bilateral hip arthroplasties. No obvious fractures, subluxations, or dislocations.      Assessment/Plan:  Patient is a 79 y.o. 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    Given that the patient is not septic-appearing and he has no discomfort with mid-range of motion, I do not believe this is septic arthritis. He has a history of gout with arthritic changes on x-rays consistent with inflammatory arthropathy, so I suspect his RIGHT shoulder effusion is likely due to this. I discussed with the patient that the definitive way to make this decision and rule out a septic arthritis is to perform an arthrocentesis. After discussion of the risks and benefits, the patient elected to proceed.    Procedure: under sterile conditions, approximately 20 cc of cloudy, yellow fluid was aspirated from the RIGHT shoulder using an anterolateral injection site and 18 gauge needle. Hemostasis was achieved. There were no complications.    We will await the fluid analysis for further recommendations. In  the interim, the patient may be weight bearing and range of motion as tolerated. Pain control with anti-inflammatories. Further care per the medical primary team.    Eugene Garnet, MD  Orthopaedic Surgery, Sports Medicine  [facility]

## 2019-04-06 NOTE — Plan of Care (Addendum)
Problem: Moderate/High Fall Risk Score >5  Goal: Patient will remain free of falls  Outcome: Progressing     Problem: Safety  Goal: Patient will be free from injury during hospitalization  Outcome: Progressing  Goal: Patient will be free from infection during hospitalization  Outcome: Progressing     Problem: Pain  Goal: Pain at adequate level as identified by patient  Outcome: Progressing     Problem: Side Effects from Pain Analgesia  Goal: Patient will experience minimal side effects of analgesic therapy  Outcome: Progressing     Problem: Discharge Barriers  Goal: Patient will be discharged home or other facility with appropriate resources  Outcome: Progressing     Problem: Psychosocial and Spiritual Needs  Goal: Demonstrates ability to cope with hospitalization/illness  Outcome: Progressing     Problem: Compromised Tissue integrity  Goal: Damaged tissue is healing and protected  Outcome: Progressing  Goal: Nutritional status is improving  Outcome: Progressing     Problem: Impaired Mobility  Goal: Mobility/Activity is maintained at optimal level for patient  Outcome: Progressing    Pt A&Ox4, on room air VSS. Pt offers occasional complaints of R-shoulder discomfort eased with ice application. Pt ambulated today with PT to bathroom, difficult to stand from sitting position. MD Lauralee Evener aspirated R-shoulder at bedside with resulting sample positive for crystals in the synovial fluid reflecting inflammatory arthritis. Call bell w/in reach, able to utilize urinal with assistance.

## 2019-04-06 NOTE — UM Notes (Signed)
Per Physician Advisor review, 04/06/19    "Awaiting ortho eval; potential upgrade - but unclear why ortho did not see the patient yesterday"    Ane Payment, UR Case Manager, [phone]

## 2019-04-06 NOTE — Progress Notes (Signed)
RIGHT shoulder aspiration with 5K WBCs, positive for crystals. Consistent with inflammatory arthritis, not septic.    Results relayed to patient. Pain control with anti-inflammatories, treat for gout. He may follow-up with me as an outpatient as needed.

## 2019-04-07 LAB — CBC
Absolute NRBC: 0 10*3/uL (ref 0.00–0.00)
Hematocrit: 32.5 % — ABNORMAL LOW (ref 37.6–49.6)
Hgb: 10.8 g/dL — ABNORMAL LOW (ref 12.5–17.1)
MCH: 36.1 pg — ABNORMAL HIGH (ref 25.1–33.5)
MCHC: 33.2 g/dL (ref 31.5–35.8)
MCV: 108.7 fL — ABNORMAL HIGH (ref 78.0–96.0)
MPV: 11 fL (ref 8.9–12.5)
Nucleated RBC: 0 /100 WBC (ref 0.0–0.0)
Platelets: 158 10*3/uL (ref 142–346)
RBC: 2.99 10*6/uL — ABNORMAL LOW (ref 4.20–5.90)
RDW: 17 % — ABNORMAL HIGH (ref 11–15)
WBC: 11.15 10*3/uL — ABNORMAL HIGH (ref 3.10–9.50)

## 2019-04-07 MED ORDER — NAPROXEN 375 MG PO TABS
375.00 mg | ORAL_TABLET | Freq: Two times a day (BID) | ORAL | 0 refills | Status: AC
Start: 2019-04-07 — End: ?

## 2019-04-07 NOTE — Discharge Instr - AVS First Page (Addendum)
Reason for your Hospital Admission:  H/o Gout with acute right shoulder gouty attack   H/o CAD  H/o GERD  Chronic anemia and TCP  H/o HLD      Instructions for after your discharge:  Take Nepraxen 370 mg po bid x3 more days (renally dosed),take it with food and not empty stomach   C/w colchicine 0.3 mg oral daily  Follow up with Dr.Narvaez in the office in 2 weeks or with Mikael Spray Orthopedist  Follow up with PCP in 1 week

## 2019-04-07 NOTE — Progress Notes (Signed)
Patient is going to [facility]. Room #160. Nurse report number is [phone].  Called to give update to [place] at [facility]. Transport given report and patient has now been transported from [facility]B.

## 2019-04-07 NOTE — Progress Notes (Signed)
Call received from Roselee Culver [facility]) phone number [phone] about patient placement.    Patient is going to [facility]. Room #160. Nurse report number is [phone].    Transport pickup time 19:30. If not call [phone] for update.

## 2019-04-07 NOTE — Plan of Care (Signed)
Problem: Moderate/High Fall Risk Score >5  Goal: Patient will remain free of falls  Outcome: Completed     Problem: Safety  Goal: Patient will be free from injury during hospitalization  Outcome: Completed  Goal: Patient will be free from infection during hospitalization  Outcome: Completed     Problem: Pain  Goal: Pain at adequate level as identified by patient  Outcome: Completed     Problem: Side Effects from Pain Analgesia  Goal: Patient will experience minimal side effects of analgesic therapy  Outcome: Completed     Problem: Discharge Barriers  Goal: Patient will be discharged home or other facility with appropriate resources  Outcome: Completed     Problem: Psychosocial and Spiritual Needs  Goal: Demonstrates ability to cope with hospitalization/illness  Outcome: Completed     Problem: Compromised Tissue integrity  Goal: Damaged tissue is healing and protected  Outcome: Completed  Goal: Nutritional status is improving  Outcome: Completed     Problem: Impaired Mobility  Goal: Mobility/Activity is maintained at optimal level for patient  Outcome: Completed

## 2019-04-07 NOTE — Progress Notes (Signed)
Left VM for [facility] CM Misty Stanley [phone] regarding Insurance AUTH and arranging transportation    [employer] Intake [phone]

## 2019-04-07 NOTE — Final Progress Note (DC Note for stay less than 48 (Signed)
Date:04/07/2019   Patient Name: Jordan [name]  Attending Physician: Richarda Osmond, MD  Today:   BP 115/67    Pulse (!) 51    Temp 97.8 F (36.6 C) (Oral)    Resp 19    Ht 1.753 m (5\' 9" )    Wt 59.1 kg (130 lb 4.7 oz)    SpO2 97%    BMI 19.24 kg/m   Ranges for the last 24 hours:  Temp:  [97.5 F (36.4 C)-98.1 F (36.7 C)] 97.8 F (36.6 C)  Heart Rate:  [51-75] 51  Resp Rate:  [16-19] 19  BP: (111-149)/(52-67) 115/67    Date of Admission:   04/04/2019    Date of Discharge:   04/07/2019    Outcome of Hospitalization:   Right shoulder acute gouty attack in this pt with hx of gout before  SIRS due to acute gout,no infection  Hypophosphatemia, replaced phos and resolved  Chronic anemia and TCP,  H/o GERD  H/o HLD  H/o CAD    Significant Events:     79 y old male with PMHx of CAD, HLD,GERD,h/o gout who received COVID-19 vaccine on 1/17 in right arm,and he developed some arm pain after,but a week later noted right shoulder pain with reduced ROM. Then on the day of ED admission, pt was trying to go to the bathroom and felt very weak and wife had to help him to the floor, no head trauma and no LOC.On arrival to ED,pt had temp of 100.1 and WBC of 22.49.bcx,ucx were done and he was given a dose of rocephin in ED and was admitted to the medicine.    CXR no active disease  CT head no acute IC abnormalities  Xray right hip no fx or dislocation   Xray right kneeTricompartmental osteoarthritis  Xray right shoulderAdvanced changes of the shoulder compatible degenerative and/or inflammatory arthritis  COVID-19 neg  CRP 19.9, ESR 78  WBC 22-->13-->11 today  plt 131-->126-->158  hgb 11.1-->9.6-->10.8  Uric acid wnl  bcx NGTD  ucx neg  Joint Aspiration cx NGTD    He was found to have acute gouty attack of right shoulder,he was started on naproxen 375 mg po bid (renally dosed) and was seen by ortho and had aspiration of right shoulder joint which showed crystal per ortho and no septic arthritis and he did great and is pending [place] to  SNF [facility] [place])  when bed available.     General: AAOx3,in no acute distress  Cardiovascular: regular rate and rhythm, no murmurs  Lungs: clear to auscultation bilaterally, no additional sounds  Abdomen: soft, non-tender, non-distended; normoactive bowel sounds  Extremities: +pain and swelling right shoulder and reduced ROM,however,it is much better than before and not warm to touch anymore  Neurological: Alert and oriented X 3, moves all extremities with reduced ROM of b/l shoulder R>>L      Lab Results last 48 Hours     Procedure Component Value Units Date/Time    CBC without differential [[id number]]  (Abnormal) Collected: 04/07/19 1038    Specimen: Blood Updated: 04/07/19 1049     WBC 11.15 x10 3/uL      Hgb 10.8 g/dL      Hematocrit 56.4 %      Platelets 158 x10 3/uL      RBC 2.99 x10 6/uL      MCV 108.7 fL      MCH 36.1 pg      MCHC 33.2 g/dL  RDW 17 %      MPV 11.0 fL      Nucleated RBC 0.0 /100 WBC      Absolute NRBC 0.00 x10 3/uL     Blood Culture Aerobic and Anaerobic (age 78 or older) [[id number]] Collected: 04/04/19 1732    Specimen: Blood, Venipuncture Updated: 04/06/19 2321    Narrative:      ORDER#: [id number]                                    ORDERED BY: OROGBEMI, BABAT  SOURCE: Blood, Venipuncture arm                      COLLECTED:  04/04/19 17:32  ANTIBIOTICS AT COLL.:                                RECEIVED :  04/04/19 23:10  Culture Blood Aerobic and Anaerobic        PRELIM      04/06/19 23:21  04/05/19   No Growth after 1 day/s of incubation.  04/06/19   No Growth after 2 day/s of incubation.      Blood Culture Aerobic and Anaerobic (age 78 or older) [[id number]] Collected: 04/04/19 1659    Specimen: Blood, Venipuncture Updated: 04/06/19 2321    Narrative:      ORDER#: [id number]                                    ORDERED BY: OROGBEMI, BABAT  SOURCE: Blood, Venipuncture arm                      COLLECTED:  04/04/19 16:59  ANTIBIOTICS AT COLL.:                                 RECEIVED :  04/04/19 23:10  Culture Blood Aerobic and Anaerobic        PRELIM      04/06/19 23:21  04/05/19   No Growth after 1 day/s of incubation.  04/06/19   No Growth after 2 day/s of incubation.      Adult Urine culture [[id number]] Collected: 04/05/19 1145    Specimen: Urine, Clean Catch Updated: 04/06/19 1800    Narrative:      Replace urinary catheter prior to obtaining the urine culture  if it has been in place for greater than or equal to 14  days:->N/A No Foley  Indications for Urine Culture:->Other (please specify in  Comments)  ORDER#: [id number]                                    ORDERED BY: Antony Sian  SOURCE: Urine, Clean Catch                           COLLECTED:  04/05/19 11:45  ANTIBIOTICS AT COLL.:                                RECEIVED :  04/05/19 17:20  Culture Urine  FINAL       04/06/19 18:00  04/06/19   1,000 - 9,000 CFU/ML of normal urogenital or skin microbiota, no further             work      Culture + Gram Zachary George Fluid [[id number]] Collected: 04/06/19 1307    Specimen: Body Fluid from Synovial Fluid Updated: 04/06/19 1651    Narrative:      ORDER#: [id number]                                    ORDERED BY: Evelena Peat  SOURCE: Synovial Fluid RIGHT shoulder                COLLECTED:  04/06/19 13:07  ANTIBIOTICS AT COLL.:                                RECEIVED :  04/06/19 15:57  Stain, Gram                                FINAL       04/06/19 16:51  04/06/19   Moderate WBCs             No organisms seen             Stain performed on Cytospin (concentrated) specimen  Culture and Gram Stain, Aerobic, Body FluidPENDING      Body Fluid Cell Count [[id number]]  (Abnormal) Collected: 04/06/19 1307    Specimen: Body Fluid Updated: 04/06/19 1446     Body Fluid Source: Synovial Fluid     Body Fluid WBC 5,495 /cumm      Body Fluid RBC 12,000 /cumm      Body Fluid Polymorphonuclear Cell 70 %      Body Fluid Lymphocytes 9 %      Body Fluid  Monocyte/Macrophage Cells 21 %     Narrative:      RIGHT shoulder    BODY FLUID CRYSTAL [[id number]] Collected: 04/06/19 1307    Specimen: Body Fluid from Synovial Fluid Updated: 04/06/19 1437     Body Fluid Source: Synovial     Crystals UA SeePathRev    Narrative:      RIGHT shoulder    Uric acid [[id number]] Collected: 04/06/19 0637    Specimen: Blood Updated: 04/06/19 0828     Uric acid 5.8 mg/dL     Basic Metabolic Panel [[id number]] Collected: 04/06/19 0637    Specimen: Blood Updated: 04/06/19 0828     Glucose 86 mg/dL      BUN 20 mg/dL      Creatinine 0.8 mg/dL      Calcium 8.6 mg/dL      Sodium 425 mEq/L      Potassium 4.3 mEq/L      Chloride 106 mEq/L      CO2 29 mEq/L      Anion Gap 7.0    Phosphorus [[id number]] Collected: 04/06/19 0637    Specimen: Blood Updated: 04/06/19 0828     Phosphorus 2.4 mg/dL     GFR [[id number]] Collected: 04/06/19 0637     Updated: 04/06/19 0828     EGFR >60.0    CBC without differential [[id number]]  (Abnormal) Collected: 04/06/19 0637    Specimen: Blood Updated: 04/06/19 0800     WBC  10.31 x10 3/uL      Hgb 9.9 g/dL      Hematocrit 40.9 %      Platelets 126 x10 3/uL      RBC 2.79 x10 6/uL      MCV 108.6 fL      MCH 35.5 pg      MCHC 32.7 g/dL      RDW 18 %      MPV 11.5 fL      Nucleated RBC 0.0 /100 WBC      Absolute NRBC 0.00 x10 3/uL           Procedures performed:   Knee 4+ Views Right    Result Date: 04/04/2019    Tricompartmental osteoarthritis Laurena Slimmer, MD  04/04/2019 8:22 PM    Ct Head Without Contrast    Result Date: 04/04/2019  1.  No abnormal mass or acute intracranial hemorrhage is identified. 2.  Areas of relative hypodensity are seen in the white matter of the cerebral hemispheres. This is a nonspecific finding. It may be related to chronic ischemic change from small vessel disease. Tana Felts, MD  04/04/2019 5:29 PM    Hip Right Ap And Lateral With Pelvis    Result Date: 04/04/2019    Good position of the joint prothesis. No displaced fracture or dislocation. Slight  cortical irregularity of the right lateral greater trochanteric most likely represents degenerative enthesopathy and/or overlying postsurgical calcification. If the patient is focally tender over the right hip this could represent a nondisplaced fracture on a background of osteopenia Laurena Slimmer, MD  04/04/2019 8:13 PM    Shoulder Right 2+ Views    Result Date: 04/04/2019    Advanced changes of the shoulder compatible degenerative and/or inflammatory arthritis Laurena Slimmer, MD  04/04/2019 8:18 PM    Xr Chest  Ap Portable    Result Date: 04/04/2019   Bilateral healed rib fractures are present which have occurred when compared to 2008. No acute infiltrate. Mild chronic interstitial changes Kinnie Feil, MD  04/04/2019 4:43 PM    Aspiration of right shoulder by Dr.Narvaez on 04/06/2019    Treatment Team:   Attending Provider: Richarda Osmond, MD  Consulting Physician: Havery Moros, MD    Disposition:   Disposition: SNF [facility])    Condition at Discharge:   Improved and stable     Discharge Instructions:     Follow-up Information     [facility] PCP Follow up.    Why: as soon as possible           Eugene Garnet, MD Follow up in 2 week(s).    Specialties: Orthopaedic Surgery, Sports Medicine  Contact information:  [address]  [place] [address]  [phone]                      Discharge Medication List      Taking    alendronate 70 MG tablet  Dose: 70 mg  Commonly known as: FOSAMAX  Take 70 mg by mouth every 7 days.Take 1 tab by mouth every 7 days with a full glass of water on an empty stomach. Do not take anything else by mouth or lie down for 30 mins.     aspirin 81 MG chewable tablet  Dose: 81 mg  Chew 81 mg by mouth daily.     atorvastatin 40 MG tablet  Dose: 40 mg  Commonly known as: LIPITOR  Take 40 mg  by mouth daily.     colchicine 0.6 MG tablet  Dose: 0.6 mg  Take 0.6 mg by mouth daily.     famotidine 20 MG tablet  Commonly known as: PEPCID  Take by mouth daily      metoprolol tartrate 50 MG  tablet  Dose: 50 mg  Commonly known as: LOPRESSOR  Take 50 mg by mouth 2 (two) times daily.     naproxen 375 MG tablet  Dose: 375 mg  Commonly known as: NAPROSYN  Take 1 tablet (375 mg total) by mouth 2 (two) times daily with meals  For 3 more days only   raNITIdine 150 MG tablet  Dose: 150 mg  Commonly known as: ZANTAC  Take 150 mg by mouth 2 (two) times daily.            Signed by: Richarda Osmond, MD

## 2019-04-07 NOTE — Progress Notes (Incomplete)
Pt A&Ox4 on room VSS.

## 2019-04-08 NOTE — Progress Notes (Signed)
04/08/19 0816   Discharge Disposition   Patient preference/choice provided? Yes   Physical Discharge Disposition SNF   Receiving facility, unit and room number: [facility]   Nursing report phone number: [phone]   Facility fax number: N/A   Mode of Transportation Ambulance   Pick up time 1900   Patient/Family/POA notified of transfer plan Yes   Patient agreeable to discharge plan/expected d/c date? Yes   Family/POA agreeable to discharge plan/expected d/c date? Yes   Bedside nurse notified of transport plan? Yes   CM Interventions   Multidisciplinary rounds/family meeting before d/c? Yes   Medicare Checklist   Is this a Medicare patient? Yes   Patient received 1st IMM Letter? Yes   3 midnight inpatient qualifying stay (SNF only) Yes   Medicaid/DMAS   Medicaid Pre-Screening completed No   DMAS 95 MI/MR completed and faxed Yes

## 2019-04-09 LAB — BODY FLUID PATH REVIEW-

## 2019-04-09 LAB — BODY FLUID CRYSTAL PATH REVIEW

## 2020-02-10 ENCOUNTER — Emergency Department: Payer: Medicare HMO

## 2020-02-10 ENCOUNTER — Emergency Department
Admission: EM | Admit: 2020-02-10 | Discharge: 2020-02-10 | Disposition: A | Discharge status: Other Acute Inpatient Hospital | Payer: Medicare HMO | Attending: Emergency Medicine | Admitting: Emergency Medicine

## 2020-02-10 DIAGNOSIS — R531 Weakness: Secondary | ICD-10-CM

## 2020-02-10 DIAGNOSIS — N390 Urinary tract infection, site not specified: Secondary | ICD-10-CM | POA: Insufficient documentation

## 2020-02-10 DIAGNOSIS — R8281 Pyuria: Secondary | ICD-10-CM

## 2020-02-10 DIAGNOSIS — A4150 Gram-negative sepsis, unspecified: Secondary | ICD-10-CM | POA: Insufficient documentation

## 2020-02-10 DIAGNOSIS — Z20822 Contact with and (suspected) exposure to covid-19: Secondary | ICD-10-CM | POA: Insufficient documentation

## 2020-02-10 LAB — CBC WITH MANUAL DIFFERENTIAL
Absolute NRBC: 0.02 10*3/uL — ABNORMAL HIGH (ref 0.00–0.00)
Atypical Lymphocytes %: 1 %
Atypical Lymphocytes Absolute: 0.21 10*3/uL — ABNORMAL HIGH (ref 0.00–0.00)
Band Neutrophils Absolute: 5.7 10*3/uL — ABNORMAL HIGH (ref 0.00–1.00)
Band Neutrophils: 27 %
Basophils Absolute Manual: 0 10*3/uL (ref 0.00–0.08)
Basophils Manual: 0 %
Cell Morphology: ABNORMAL — AB
Eosinophils Absolute Manual: 0 10*3/uL (ref 0.00–0.44)
Eosinophils Manual: 0 %
Hematocrit: 31.8 % — ABNORMAL LOW (ref 37.6–49.6)
Hgb: 10.6 g/dL — ABNORMAL LOW (ref 12.5–17.1)
Lymphocytes Absolute Manual: 0.42 10*3/uL (ref 0.42–3.22)
Lymphocytes Manual: 2 %
MCH: 36.8 pg — ABNORMAL HIGH (ref 25.1–33.5)
MCHC: 33.3 g/dL (ref 31.5–35.8)
MCV: 110.4 fL — ABNORMAL HIGH (ref 78.0–96.0)
MPV: 11.5 fL (ref 8.9–12.5)
Monocytes Absolute: 0.63 10*3/uL (ref 0.21–0.85)
Monocytes Manual: 3 %
Neutrophils Absolute Manual: 14.14 10*3/uL — ABNORMAL HIGH (ref 1.10–6.33)
Nucleated RBC: 0.1 /100 WBC — ABNORMAL HIGH (ref 0.0–0.0)
Platelet Estimate: NORMAL
Platelets: 167 10*3/uL (ref 142–346)
RBC: 2.88 10*6/uL — ABNORMAL LOW (ref 4.20–5.90)
RDW: 19 % — ABNORMAL HIGH (ref 11–15)
Segmented Neutrophils: 67 %
WBC: 21.1 10*3/uL — ABNORMAL HIGH (ref 3.10–9.50)

## 2020-02-10 LAB — CBC AND DIFFERENTIAL
Absolute NRBC: 0.02 10*3/uL — ABNORMAL HIGH (ref 0.00–0.00)
Basophils Absolute Automated: 0.04 10*3/uL (ref 0.00–0.08)
Basophils Automated: 0.2 %
Eosinophils Absolute Automated: 0.01 10*3/uL (ref 0.00–0.44)
Eosinophils Automated: 0 %
Hematocrit: 31.8 % — ABNORMAL LOW (ref 37.6–49.6)
Hgb: 10.6 g/dL — ABNORMAL LOW (ref 12.5–17.1)
Immature Granulocytes Absolute: 0.2 10*3/uL — ABNORMAL HIGH (ref 0.00–0.07)
Immature Granulocytes: 0.9 %
Lymphocytes Absolute Automated: 0.49 10*3/uL (ref 0.42–3.22)
Lymphocytes Automated: 2.3 %
MCH: 36.8 pg — ABNORMAL HIGH (ref 25.1–33.5)
MCHC: 33.3 g/dL (ref 31.5–35.8)
MCV: 110.4 fL — ABNORMAL HIGH (ref 78.0–96.0)
MPV: 11.5 fL (ref 8.9–12.5)
Monocytes Absolute Automated: 0.71 10*3/uL (ref 0.21–0.85)
Monocytes: 3.4 %
Neutrophils Absolute: 19.65 10*3/uL — ABNORMAL HIGH (ref 1.10–6.33)
Neutrophils: 93.2 %
Nucleated RBC: 0.1 /100 WBC — ABNORMAL HIGH (ref 0.0–0.0)
Platelets: 167 10*3/uL (ref 142–346)
RBC: 2.88 10*6/uL — ABNORMAL LOW (ref 4.20–5.90)
RDW: 19 % — ABNORMAL HIGH (ref 11–15)
WBC: 21.1 10*3/uL — ABNORMAL HIGH (ref 3.10–9.50)

## 2020-02-10 LAB — URINALYSIS REFLEX TO MICROSCOPIC EXAM - REFLEX TO CULTURE
Bilirubin, UA: NEGATIVE
Glucose, UA: NEGATIVE
Ketones UA: NEGATIVE
Nitrite, UA: NEGATIVE
Protein, UR: 100 — AB
Specific Gravity UA: 1.012 (ref 1.001–1.035)
Urine pH: 7 (ref 5.0–8.0)
Urobilinogen, UA: NEGATIVE mg/dL (ref 0.2–2.0)

## 2020-02-10 LAB — COVID-19 (SARS-COV-2) & INFLUENZA  A/B, NAA (ROCHE LIAT)
Influenza A: NOT DETECTED
Influenza B: NOT DETECTED
SARS CoV 2 Overall Result: NOT DETECTED

## 2020-02-10 LAB — COMPREHENSIVE METABOLIC PANEL
ALT: 34 U/L (ref 0–55)
AST (SGOT): 46 U/L — ABNORMAL HIGH (ref 5–34)
Albumin/Globulin Ratio: 0.9 (ref 0.9–2.2)
Albumin: 3.2 g/dL — ABNORMAL LOW (ref 3.5–5.0)
Alkaline Phosphatase: 134 U/L — ABNORMAL HIGH (ref 38–106)
Anion Gap: 12 (ref 5.0–15.0)
BUN: 13 mg/dL (ref 9–28)
Bilirubin, Total: 1.1 mg/dL (ref 0.2–1.2)
CO2: 26 mEq/L (ref 22–29)
Calcium: 9.6 mg/dL (ref 7.9–10.2)
Chloride: 101 mEq/L (ref 100–111)
Creatinine: 1 mg/dL (ref 0.7–1.3)
Globulin: 3.5 g/dL (ref 2.0–3.6)
Glucose: 109 mg/dL — ABNORMAL HIGH (ref 70–100)
Potassium: 4.6 mEq/L (ref 3.5–5.1)
Protein, Total: 6.7 g/dL (ref 6.0–8.3)
Sodium: 139 mEq/L (ref 136–145)

## 2020-02-10 LAB — LACTIC ACID, PLASMA
Lactic Acid: 2.8 mmol/L — ABNORMAL HIGH (ref 0.2–2.0)
Lactic Acid: 1.7 mmol/L (ref 0.2–2.0)

## 2020-02-10 LAB — TROPONIN I: Troponin I: 0.02 ng/mL (ref 0.00–0.05)

## 2020-02-10 LAB — MAGNESIUM: Magnesium: 1.7 mg/dL (ref 1.6–2.6)

## 2020-02-10 LAB — GFR: EGFR: >60

## 2020-02-10 MED ORDER — PIPERACILLIN SOD-TAZOBACTAM SO 4.5 (4-0.5) G IV SOLR
4.5000 g | Freq: Once | INTRAVENOUS | Status: AC
Start: 2020-02-10 — End: 2020-02-10
  Administered 2020-02-10: 12:00:00 4.5 g via INTRAVENOUS
  Filled 2020-02-10: qty 20

## 2020-02-10 MED ORDER — SODIUM CHLORIDE 0.9 % IV BOLUS
1000.0000 mL | Freq: Once | INTRAVENOUS | Status: AC
Start: 2020-02-10 — End: 2020-02-10
  Administered 2020-02-10: 12:00:00 1000 mL via INTRAVENOUS

## 2020-02-10 MED ORDER — ACETAMINOPHEN 500 MG PO TABS
1000.0000 mg | ORAL_TABLET | Freq: Once | ORAL | Status: AC
Start: 2020-02-10 — End: 2020-02-10
  Administered 2020-02-10: 11:00:00 1000 mg via ORAL
  Filled 2020-02-10: qty 2

## 2020-02-10 MED ORDER — VANCOMYCIN HCL IN NACL 1.25-0.9 GM/250ML-% IV SOLN
20.0000 mg/kg | Freq: Once | INTRAVENOUS | Status: AC
Start: 2020-02-10 — End: 2020-02-10
  Administered 2020-02-10: 1250 mg via INTRAVENOUS
  Filled 2020-02-10: qty 250

## 2020-02-10 NOTE — ED Provider Notes (Signed)
EMERGENCY DEPARTMENT NOTE     Patient initially seen and examined at   ED PHYSICIAN ASSIGNED     Date/Time Event User Comments    02/10/20 1044 Physician Assigned Azalia Bilis Gracy Racer, MD assigned as Attending         ED MIDLEVEL (APP) ASSIGNED     None          HISTORY OF PRESENT ILLNESS   Historian: EMS  Translator Used: No    Chief Complaint: Generalized weakness and Altered Mental Status     Mechanism of Injury:       79 y.o. male with h/o CAD and HTN presents to the ED for decreased mental status at home and generalized weakness.  Lives at home with wife who called EMS.      1. Location of symptoms: see above  2. Onset of symptoms: this morning  3. What was patient doing when symptoms started (Context): see above  4. Severity: moderate  5. Timing: constant  6. Activities that worsen symptoms: nothing  7. Activities that improve symptoms: nothing  8. Quality: n/a  9. Radiation of symptoms: no  10. Associated signs and Symptoms: see above  11. Are symptoms worsening? yes  MEDICAL HISTORY     Past Medical History:  Past Medical History:   Diagnosis Date    Arthritis     Hypertension     Myocardial infarct     Sleep apnea        Past Surgical History:  Past Surgical History:   Procedure Laterality Date    CARDIAC SURGERY      EYE SURGERY      HERNIA REPAIR      JOINT REPLACEMENT      TONGUE SURGERY         Social History:  Social History     Socioeconomic History    Marital status: Married   Tobacco Use    Smoking status: Never Smoker    Smokeless tobacco: Never Used   Substance and Sexual Activity    Alcohol use: No    Drug use: No       Family History:  History reviewed. No pertinent family history.    Outpatient Medication:  Previous Medications    ALENDRONATE (FOSAMAX) 70 MG TABLET    Take 70 mg by mouth every 7 days.Take 1 tab by mouth every 7 days with a full glass of water on an empty stomach. Do not take anything else by mouth or lie down for 30 mins.    ASPIRIN 81 MG  CHEWABLE TABLET    Chew 81 mg by mouth daily.    ATORVASTATIN (LIPITOR) 40 MG TABLET    Take 40 mg by mouth daily.    COLCHICINE 0.6 MG TABLET    Take 0.6 mg by mouth daily.    FAMOTIDINE (PEPCID) 20 MG TABLET    Take by mouth daily       METOPROLOL (LOPRESSOR) 50 MG TABLET    Take 50 mg by mouth 2 (two) times daily.    NAPROXEN (NAPROSYN) 375 MG TABLET    Take 1 tablet (375 mg total) by mouth 2 (two) times daily with meals    RANITIDINE (ZANTAC) 150 MG TABLET    Take 150 mg by mouth 2 (two) times daily.         REVIEW OF SYSTEMS   Review of Systems   Unable to perform ROS: Mental status change      PHYSICAL  EXAM     ED Triage Vitals [02/10/20 1045]   Enc Vitals Group      BP 162/73      Heart Rate (!) 127      Resp Rate (!) 30      Temp 98.2 F (36.8 C)      Temp Source Oral      SpO2 94 %      Weight       Height       Head Circumference       Peak Flow       Pain Score 0      Pain Loc       Pain Edu?       Excl. in GC?      Nursing note and vitals reviewed.  Constitutional:  Well developed, well nourished. Awake & Oriented x2.  Head:  Atraumatic. Normocephalic.    Eyes:  PERRL. EOMI. Conjunctivae are not pale.  ENT:  Mucous membranes are moist and intact. Oropharynx is clear and symmetric.  Patent airway.  Neck:  Supple. Full ROM.    Cardiovascular:  Tachycardic rate. Regular rhythm. No murmurs, rubs, or gallops.  Pulmonary/Chest:  No evidence of respiratory distress. Clear to auscultation bilaterally.  No wheezing, rales or rhonchi.   Abdominal:  Soft and non-distended. There is no tenderness. No rebound, guarding, or rigidity.  Back:  Full ROM. Nontender.  Extremities:  No edema. No cyanosis. No clubbing. Full range of motion in all extremities.  R great toe with scabbing and redness  Skin:  Skin is warm and dry.  No diaphoresis. No rash. Sacral decub noted with no purulent drainage noted  Neurological:  Alert, awake, and appropriate. Normal speech. Motor normal.  No focal deficits  Psychiatric:  Good eye  contact. Normal interaction, affect, and behavior.      MEDICAL DECISION MAKING     DISCUSSION    Patient with generalized weakness and AMS.  Temperature noted to be 103.4 on arrival.  Tylenol given for fever.  Lactate 2.8.  1 L NS given.  WBC noted to be 21.  Given empiric zosyn and vanc.  HR and RR improved with treatment.  CXR with no PNA.  UA c/w infection.  Likely cause of sepsis.  COVID and flu negative.  Discussed with hospitalist who recommended CT abd and head.  Wife arrived and patient [facility] patient.  Discussed with Mikael Spray and will admit to [facility].  Accepting Dr. Berdie Ogren.          All labs and vital signs from the current visit have been reviewed and any abnormality that is present is not due to severe sepsis or septic shock.    Vital Signs: Reviewed the patients vital signs.   Nursing Notes: Reviewed and utilized available nursing notes.  Medical Records Reviewed: Reviewed available past medical records.  Counseling: The emergency provider has spoken with the patient and discussed todays findings, in addition to providing specific details for the plan of care.  Questions are answered and there is agreement with the plan.      CARDIAC STUDIES    The following cardiac studies were independently interpreted by the Emergency Medicine Physician.  For full cardiac study results please see chart.    Monitor Strip  Interpreted by ED Physician  Rate: 77  Rhythm: NSR   ST Changes: none    EKG Interpretation:  Signed and interpreted byED Physician   Time Interpreted: 1050  Rate: 128  Rhythm: Sinus  tachycardia  Axis: normal  Intervals: normal  Blocks: none  ST segments: no acute changes  Interpretation: Nonspecific  EKG    EMERGENCY IMAGING STUDIES    The following imagine studies were independently interpreted by me (emergency physician):    Radiology:  Interpreted by me (ED Physician)  Study: Chest Xray  Results: No infiltrate. No pneumothorax. No hemothorax. No cardiomegaly. No CHF.  Impression: No acute  intrathoracic abnormality.    RADIOLOGY IMAGING STUDIES      XR Chest  AP Portable   Final Result      1. No acute cardiopulmonary process.         Wyatt Portela, MD    02/10/2020 11:36 AM      CT Abd/Pelvis without Contrast    (Results Pending)   CT Head without Contrast    (Results Pending)     PULSE OXIMETRY    Oxygen Saturation by Pulse Oximetry: 98%  Interventions: none  Interpretation:  Low normal     EMERGENCY DEPT. MEDICATIONS      ED Medication Orders (From admission, onward)    Start Ordered     Status Ordering Provider    02/10/20 1142 02/10/20 1137  vancomycin (VANCOCIN) 1250 mg in sodium chloride 0.9% 250 mL (premix)  Once in ED        Route: Intravenous  Ordered Dose: 20 mg/kg     Last MAR action: New Bag Winfred Iiams K    02/10/20 1138 02/10/20 1137  piperacillin-tazobactam (ZOSYN) injection 4.5 g  Once        Route: Intravenous  Ordered Dose: 4.5 g     Last MAR action: Given Annie Paras    02/10/20 1132 02/10/20 1131  sodium chloride 0.9 % bolus 1,000 mL  Once        Route: Intravenous  Ordered Dose: 1,000 mL     Last MAR action: Stopped Reathel Turi K    02/10/20 1103 02/10/20 1102  acetaminophen (TYLENOL) tablet 1,000 mg  Once        Route: Oral  Ordered Dose: 1,000 mg     Last MAR action: Given Musa Rewerts K          LABORATORY RESULTS    Ordered and independently interpreted AVAILABLE laboratory tests. Please see results section in chart for full details.  Results for orders placed or performed during the hospital encounter of 02/10/20   COVID-19 (SARS-CoV-2) and Influenza A/B, NAA ([facility])    Specimen: Nasopharyngeal; Culturette   Result Value Ref Range    Purpose of COVID testing Diagnostic -PUI     SARS-CoV-2 Specimen Source Nasopharyngeal     SARS CoV 2 Overall Result Not Detected     Influenza A Not Detected     Influenza B Not Detected    CBC and differential   Result Value Ref Range    WBC 21.10 (H) 3.10 - 9.50 x10 3/uL    Hgb 10.6 (L) 12.5 - 17.1 g/dL    Hematocrit  16.1 (L) 37.6 - 49.6 %    Platelets 167 142 - 346 x10 3/uL    RBC 2.88 (L) 4.20 - 5.90 x10 6/uL    MCV 110.4 (H) 78.0 - 96.0 fL    MCH 36.8 (H) 25.1 - 33.5 pg    MCHC 33.3 31.5 - 35.8 g/dL    RDW 19 (H) 11 - 15 %    MPV 11.5 8.9 - 12.5 fL    Neutrophils 93.2 None %  Lymphocytes Automated 2.3 None %    Monocytes 3.4 None %    Eosinophils Automated 0.0 None %    Basophils Automated 0.2 None %    Immature Granulocytes 0.9 None %    Nucleated RBC 0.1 (H) 0.0 - 0.0 /100 WBC    Neutrophils Absolute 19.65 (H) 1.10 - 6.33 x10 3/uL    Lymphocytes Absolute Automated 0.49 0.42 - 3.22 x10 3/uL    Monocytes Absolute Automated 0.71 0.21 - 0.85 x10 3/uL    Eosinophils Absolute Automated 0.01 0.00 - 0.44 x10 3/uL    Basophils Absolute Automated 0.04 0.00 - 0.08 x10 3/uL    Immature Granulocytes Absolute 0.20 (H) 0.00 - 0.07 x10 3/uL    Absolute NRBC 0.02 (H) 0.00 - 0.00 x10 3/uL   Comprehensive metabolic panel   Result Value Ref Range    Glucose 109 (H) 70 - 100 mg/dL    BUN 13 9 - 28 mg/dL    Creatinine 1.0 0.7 - 1.3 mg/dL    Sodium 161 096 - 045 mEq/L    Potassium 4.6 3.5 - 5.1 mEq/L    Chloride 101 100 - 111 mEq/L    CO2 26 22 - 29 mEq/L    Calcium 9.6 7.9 - 10.2 mg/dL    Protein, Total 6.7 6.0 - 8.3 g/dL    Albumin 3.2 (L) 3.5 - 5.0 g/dL    AST (SGOT) 46 (H) 5 - 34 U/L    ALT 34 0 - 55 U/L    Alkaline Phosphatase 134 (H) 38 - 106 U/L    Bilirubin, Total 1.1 0.2 - 1.2 mg/dL    Globulin 3.5 2.0 - 3.6 g/dL    Albumin/Globulin Ratio 0.9 0.9 - 2.2    Anion Gap 12.0 5.0 - 15.0   Troponin I   Result Value Ref Range    Troponin I 0.02 0.00 - 0.05 ng/mL   Magnesium   Result Value Ref Range    Magnesium 1.7 1.6 - 2.6 mg/dL   Lactic Acid   Result Value Ref Range    Lactic Acid 2.8 (H) 0.2 - 2.0 mmol/L   UA Reflex to Micro - Reflex to Culture    Specimen: Urine, Clean Catch   Result Value Ref Range    Urine Type Urine, Clean Ca     Color, UA Yellow Colorless - Yellow    Clarity, UA Cloudy (A) Clear - Hazy    Specific Gravity UA 1.012  1.001 - 1.035    Urine pH 7.0 5.0 - 8.0    Leukocyte Esterase, UA Large (A) Negative    Nitrite, UA Negative Negative    Protein, UR 100 (A) Negative    Glucose, UA Negative Negative    Ketones UA Negative Negative    Urobilinogen, UA Negative 0.2 - 2.0 mg/dL    Bilirubin, UA Negative Negative    Blood, UA Large (A) Negative    RBC, UA TNTC (A) 0 - 5 /hpf    WBC, UA 11 - 25 (A) 0 - 5 /hpf   GFR   Result Value Ref Range    EGFR >60.0    Lactic Acid   Result Value Ref Range    Lactic Acid 1.7 0.2 - 2.0 mmol/L   CBC WITH MANUAL DIFFERENTIAL   Result Value Ref Range    WBC 21.10 (H) 3.10 - 9.50 x10 3/uL    Hgb 10.6 (L) 12.5 - 17.1 g/dL    Hematocrit 40.9 (L) 37.6 - 49.6 %  Platelets 167 142 - 346 x10 3/uL    RBC 2.88 (L) 4.20 - 5.90 x10 6/uL    MCV 110.4 (H) 78.0 - 96.0 fL    MCH 36.8 (H) 25.1 - 33.5 pg    MCHC 33.3 31.5 - 35.8 g/dL    RDW 19 (H) 11 - 15 %    MPV 11.5 8.9 - 12.5 fL    Nucleated RBC 0.1 (H) 0.0 - 0.0 /100 WBC    Absolute NRBC 0.02 (H) 0.00 - 0.00 x10 3/uL    Segmented Neutrophils 67 None %    Band Neutrophils 27 None %    Lymphocytes Manual 2 None %    Monocytes Manual 3 None %    Eosinophils Manual 0 None %    Basophils Manual 0 None %    Atypical Lymphocytes % 1 None %    Neutrophils Absolute Manual 14.14 (H) 1.10 - 6.33 x10 3/uL    Band Neutrophils Absolute 5.70 (H) 0.00 - 1.00 x10 3/uL    Lymphocytes Absolute Manual 0.42 0.42 - 3.22 x10 3/uL    Monocytes Absolute 0.63 0.21 - 0.85 x10 3/uL    Eosinophils Absolute Manual 0.00 0.00 - 0.44 x10 3/uL    Basophils Absolute Manual 0.00 0.00 - 0.08 x10 3/uL    Atypical Lymphocytes Absolute 0.21 (H) 0.00 - 0.00 x10 3/uL    Cell Morphology Abnormal (A)     Platelet Estimate Normal     Macrocytic 2+ (A)     Schistocytes 1+ (A)     Ovalocytes Present (A)     Platelet Clumps Present (A)    ECG 12 Lead   Result Value Ref Range    Ventricular Rate 128 BPM    Atrial Rate 128 BPM    P-R Interval 120 ms    QRS Duration 100 ms    Q-T Interval 374 ms    QTC Calculation  (Bezet) 546 ms    P Axis  degrees    R Axis 32 degrees    T Axis 84 degrees       CRITICAL CARE/PROCEDURES    Critical Care  Performed by: Annie Paras, MD  Authorized by: Annie Paras, MD     Critical care provider statement:     Critical care time (minutes):  36    Critical care time was exclusive of:  Separately billable procedures and treating other patients    Critical care was necessary to treat or prevent imminent or life-threatening deterioration of the following conditions:  Sepsis    Critical care was time spent personally by me on the following activities:  Development of treatment plan with patient or surrogate, discussions with consultants, evaluation of patient's response to treatment, examination of patient, obtaining history from patient or surrogate, ordering and performing treatments and interventions, ordering and review of laboratory studies, ordering and review of radiographic studies, pulse oximetry, re-evaluation of patient's condition and review of old charts        DIAGNOSIS      Diagnosis:  Final diagnoses:   Sepsis due to gram-negative UTI   Pyuria       Disposition:  ED Disposition     ED Disposition Condition Date/Time Comment    Transfer to [facility] Feb 10, 2020  1:30 PM Accepting Dr. Berdie Ogren          Prescriptions:  Patient's Medications   New Prescriptions    No medications on file   Previous Medications  ALENDRONATE (FOSAMAX) 70 MG TABLET    Take 70 mg by mouth every 7 days.Take 1 tab by mouth every 7 days with a full glass of water on an empty stomach. Do not take anything else by mouth or lie down for 30 mins.    ASPIRIN 81 MG CHEWABLE TABLET    Chew 81 mg by mouth daily.    ATORVASTATIN (LIPITOR) 40 MG TABLET    Take 40 mg by mouth daily.    COLCHICINE 0.6 MG TABLET    Take 0.6 mg by mouth daily.    FAMOTIDINE (PEPCID) 20 MG TABLET    Take by mouth daily       METOPROLOL (LOPRESSOR) 50 MG TABLET    Take 50 mg by mouth 2 (two) times daily.     NAPROXEN (NAPROSYN) 375 MG TABLET    Take 1 tablet (375 mg total) by mouth 2 (two) times daily with meals    RANITIDINE (ZANTAC) 150 MG TABLET    Take 150 mg by mouth 2 (two) times daily.   Modified Medications    No medications on file   Discontinued Medications    No medications on file

## 2020-02-10 NOTE — ED Notes (Signed)
Bed: E21  Expected date:   Expected time:   Means of arrival:   Comments:  Medic 409

## 2020-02-10 NOTE — ED Notes (Signed)
Spoke with Mikael Spray who requested COVID results be faxed to [facility] [phone]; Results faxed at this time.

## 2020-02-10 NOTE — ED Notes (Signed)
Sacral wound noted upon arrival while obtaining rectal temp. Nonblanchable redness around site with opening noted to center.

## 2020-02-10 NOTE — Discharge Instructions (Signed)
Urinary Tract Infection, Lower     You have been diagnosed with a basic lower urinary tract infection (UTI). This means it does not involve your kidneys or areas other than your bladder.     A UTI is an infection in your bladder. Your doctor diagnosed it by testing your urine. UTIs usually causes burning when you urinate (pee) or urinating often. It might make you feel like you have to urinate even when you don’t.      UTI is usually treated with antibiotics and medicine to help with pain.     It is very important that you fill your prescription and take all of the antibiotics as directed. If a lower urinary tract infection goes untreated for too long, it can become a kidney infection.     For Women: To reduce the risk of getting another UTI:  · Always urinate before and after sexual intercourse.  · Always wipe from front to back after urinating or having a bowel movement. Do not wipe from back to front.  · Drink plenty of fluids. Try to drink cranberry or blueberry juice. These juices have a chemical that stops bacteria from sticking to the bladder.     Return here or go to the nearest Emergency Department immediately if:  · You have a fever (temperature higher than 100.4ºF / 38ºC) or shaking chills.  · You feel nauseated (sick to your stomach) or vomit (throw up).  · You have pain in your side or back.  · You don’t get better after taking all of your antibiotics.  · You have any new symptoms or concerns.  · You feel worse or do not improve.     If you can't follow up with your doctor, or if at any time you feel you need to be rechecked or seen again, come back here or go to the nearest emergency department.

## 2020-02-10 NOTE — ED Notes (Signed)
Informed patient assigned to bed 479 at [facility] [phone]). Report called to [employer]. Transport ETA 2030.

## 2020-02-11 LAB — CBC PATHOLOGIST REVIEW

## 2020-02-12 LAB — ECG 12-LEAD
Atrial Rate: 128 {beats}/min
P-R Interval: 120 ms
Q-T Interval: 374 ms
QRS Duration: 100 ms
QTC Calculation (Bezet): 546 ms
R Axis: 32 degrees
T Axis: 84 degrees
Ventricular Rate: 128 {beats}/min

## 2020-08-06 ENCOUNTER — Emergency Department (HOSPITAL_BASED_OUTPATIENT_CLINIC_OR_DEPARTMENT_OTHER): Payer: Medicare (Managed Care) | Admitting: Radiology

## 2020-08-06 ENCOUNTER — Inpatient Hospital Stay (HOSPITAL_BASED_OUTPATIENT_CLINIC_OR_DEPARTMENT_OTHER)
Admission: EM | Admit: 2020-08-06 | Discharge: 2020-08-18 | DRG: 963 | Disposition: A | Discharge status: Skilled Nursing Facility | Payer: Medicare (Managed Care) | Attending: Internal Medicine | Admitting: Internal Medicine

## 2020-08-06 ENCOUNTER — Other Ambulatory Visit: Payer: Self-pay

## 2020-08-06 ENCOUNTER — Emergency Department (HOSPITAL_BASED_OUTPATIENT_CLINIC_OR_DEPARTMENT_OTHER): Payer: Medicare (Managed Care)

## 2020-08-06 ENCOUNTER — Encounter (HOSPITAL_BASED_OUTPATIENT_CLINIC_OR_DEPARTMENT_OTHER): Payer: Self-pay

## 2020-08-06 DIAGNOSIS — E43 Unspecified severe protein-calorie malnutrition: Secondary | ICD-10-CM | POA: Insufficient documentation

## 2020-08-06 DIAGNOSIS — D696 Thrombocytopenia, unspecified: Secondary | ICD-10-CM | POA: Diagnosis not present

## 2020-08-06 DIAGNOSIS — S72114A Nondisplaced fracture of greater trochanter of right femur, initial encounter for closed fracture: Secondary | ICD-10-CM | POA: Diagnosis present

## 2020-08-06 DIAGNOSIS — R627 Adult failure to thrive: Secondary | ICD-10-CM | POA: Diagnosis present

## 2020-08-06 DIAGNOSIS — K5900 Constipation, unspecified: Secondary | ICD-10-CM | POA: Diagnosis present

## 2020-08-06 DIAGNOSIS — W19XXXA Unspecified fall, initial encounter: Secondary | ICD-10-CM | POA: Diagnosis present

## 2020-08-06 DIAGNOSIS — D72828 Other elevated white blood cell count: Secondary | ICD-10-CM | POA: Diagnosis present

## 2020-08-06 DIAGNOSIS — M109 Gout, unspecified: Secondary | ICD-10-CM | POA: Diagnosis present

## 2020-08-06 DIAGNOSIS — Z951 Presence of aortocoronary bypass graft: Secondary | ICD-10-CM

## 2020-08-06 DIAGNOSIS — R54 Age-related physical debility: Secondary | ICD-10-CM | POA: Diagnosis present

## 2020-08-06 DIAGNOSIS — I252 Old myocardial infarction: Secondary | ICD-10-CM | POA: Diagnosis not present

## 2020-08-06 DIAGNOSIS — D539 Nutritional anemia, unspecified: Secondary | ICD-10-CM | POA: Diagnosis present

## 2020-08-06 DIAGNOSIS — Z87891 Personal history of nicotine dependence: Secondary | ICD-10-CM

## 2020-08-06 DIAGNOSIS — S32491A Other specified fracture of right acetabulum, initial encounter for closed fracture: Secondary | ICD-10-CM | POA: Diagnosis present

## 2020-08-06 DIAGNOSIS — Z20822 Contact with and (suspected) exposure to covid-19: Secondary | ICD-10-CM | POA: Diagnosis present

## 2020-08-06 DIAGNOSIS — I2510 Atherosclerotic heart disease of native coronary artery without angina pectoris: Secondary | ICD-10-CM | POA: Diagnosis present

## 2020-08-06 DIAGNOSIS — I272 Pulmonary hypertension, unspecified: Secondary | ICD-10-CM | POA: Diagnosis present

## 2020-08-06 DIAGNOSIS — E7800 Pure hypercholesterolemia, unspecified: Secondary | ICD-10-CM | POA: Diagnosis present

## 2020-08-06 DIAGNOSIS — S32591A Other specified fracture of right pubis, initial encounter for closed fracture: Secondary | ICD-10-CM | POA: Diagnosis present

## 2020-08-06 DIAGNOSIS — I10 Essential (primary) hypertension: Secondary | ICD-10-CM | POA: Diagnosis present

## 2020-08-06 DIAGNOSIS — D72829 Elevated white blood cell count, unspecified: Secondary | ICD-10-CM | POA: Diagnosis not present

## 2020-08-06 DIAGNOSIS — H9192 Unspecified hearing loss, left ear: Secondary | ICD-10-CM | POA: Diagnosis present

## 2020-08-06 DIAGNOSIS — K219 Gastro-esophageal reflux disease without esophagitis: Secondary | ICD-10-CM | POA: Diagnosis present

## 2020-08-06 DIAGNOSIS — Z7983 Long term (current) use of bisphosphonates: Secondary | ICD-10-CM

## 2020-08-06 DIAGNOSIS — M79651 Pain in right thigh: Secondary | ICD-10-CM | POA: Diagnosis present

## 2020-08-06 DIAGNOSIS — Z96642 Presence of left artificial hip joint: Secondary | ICD-10-CM | POA: Diagnosis present

## 2020-08-06 DIAGNOSIS — D638 Anemia in other chronic diseases classified elsewhere: Secondary | ICD-10-CM | POA: Diagnosis present

## 2020-08-06 DIAGNOSIS — J8410 Pulmonary fibrosis, unspecified: Secondary | ICD-10-CM | POA: Diagnosis present

## 2020-08-06 DIAGNOSIS — R296 Repeated falls: Secondary | ICD-10-CM | POA: Diagnosis present

## 2020-08-06 DIAGNOSIS — S72001A Fracture of unspecified part of neck of right femur, initial encounter for closed fracture: Secondary | ICD-10-CM | POA: Diagnosis not present

## 2020-08-06 DIAGNOSIS — E559 Vitamin D deficiency, unspecified: Secondary | ICD-10-CM | POA: Diagnosis present

## 2020-08-06 DIAGNOSIS — Y92019 Unspecified place in single-family (private) house as the place of occurrence of the external cause: Secondary | ICD-10-CM | POA: Diagnosis not present

## 2020-08-06 DIAGNOSIS — R1313 Dysphagia, pharyngeal phase: Secondary | ICD-10-CM | POA: Diagnosis present

## 2020-08-06 DIAGNOSIS — M9701XA Periprosthetic fracture around internal prosthetic right hip joint, initial encounter: Secondary | ICD-10-CM | POA: Diagnosis present

## 2020-08-06 DIAGNOSIS — Z7982 Long term (current) use of aspirin: Secondary | ICD-10-CM

## 2020-08-06 DIAGNOSIS — N400 Enlarged prostate without lower urinary tract symptoms: Secondary | ICD-10-CM | POA: Diagnosis present

## 2020-08-06 DIAGNOSIS — Z681 Body mass index (BMI) 19 or less, adult: Secondary | ICD-10-CM

## 2020-08-06 DIAGNOSIS — S7001XA Contusion of right hip, initial encounter: Secondary | ICD-10-CM

## 2020-08-06 DIAGNOSIS — M810 Age-related osteoporosis without current pathological fracture: Secondary | ICD-10-CM | POA: Diagnosis present

## 2020-08-06 DIAGNOSIS — Z85819 Personal history of malignant neoplasm of unspecified site of lip, oral cavity, and pharynx: Secondary | ICD-10-CM

## 2020-08-06 DIAGNOSIS — Z79899 Other long term (current) drug therapy: Secondary | ICD-10-CM

## 2020-08-06 DIAGNOSIS — E860 Dehydration: Secondary | ICD-10-CM | POA: Diagnosis present

## 2020-08-06 LAB — MAGNESIUM: Magnesium: 1.9 mg/dL (ref 1.7–2.4)

## 2020-08-06 LAB — CBC WITH DIFFERENTIAL/PLATELET
Abs Immature Granulocytes: 0.05 10*3/uL (ref 0.00–0.07)
Basophils Absolute: 0 10*3/uL (ref 0.0–0.1)
Basophils Relative: 0 %
Eosinophils Absolute: 0.1 10*3/uL (ref 0.0–0.5)
Eosinophils Relative: 1 %
HCT: 27.6 % — ABNORMAL LOW (ref 39.0–52.0)
Hemoglobin: 8.9 g/dL — ABNORMAL LOW (ref 13.0–17.0)
Immature Granulocytes: 1 %
Lymphocytes Relative: 5 %
Lymphs Abs: 0.6 10*3/uL — ABNORMAL LOW (ref 0.7–4.0)
MCH: 34.4 pg — ABNORMAL HIGH (ref 26.0–34.0)
MCHC: 32.2 g/dL (ref 30.0–36.0)
MCV: 106.6 fL — ABNORMAL HIGH (ref 80.0–100.0)
Monocytes Absolute: 0.8 10*3/uL (ref 0.1–1.0)
Monocytes Relative: 8 %
Neutro Abs: 9.2 10*3/uL — ABNORMAL HIGH (ref 1.7–7.7)
Neutrophils Relative %: 85 %
Platelets: 153 10*3/uL (ref 150–400)
RBC: 2.59 MIL/uL — ABNORMAL LOW (ref 4.22–5.81)
RDW: 19.5 % — ABNORMAL HIGH (ref 11.5–15.5)
WBC: 10.7 10*3/uL — ABNORMAL HIGH (ref 4.0–10.5)
nRBC: 0 % (ref 0.0–0.2)

## 2020-08-06 LAB — BASIC METABOLIC PANEL
Anion gap: 5 (ref 5–15)
BUN: 16 mg/dL (ref 8–23)
CO2: 31 mmol/L (ref 22–32)
Calcium: 9.7 mg/dL (ref 8.9–10.3)
Chloride: 103 mmol/L (ref 98–111)
Creatinine, Ser: 0.84 mg/dL (ref 0.61–1.24)
GFR, Estimated: >60 mL/min (ref >60)
Glucose, Bld: 112 mg/dL — ABNORMAL HIGH (ref 70–99)
Potassium: 4.1 mmol/L (ref 3.5–5.1)
Sodium: 139 mmol/L (ref 135–145)

## 2020-08-06 LAB — URINALYSIS, ROUTINE W REFLEX MICROSCOPIC
Bilirubin Urine: NEGATIVE
Glucose, UA: NEGATIVE mg/dL
Hgb urine dipstick: NEGATIVE
Ketones, ur: NEGATIVE mg/dL
Nitrite: NEGATIVE
Protein, ur: NEGATIVE mg/dL
Specific Gravity, Urine: 1.013 (ref 1.005–1.030)
pH: 6 (ref 5.0–8.0)

## 2020-08-06 LAB — CREATININE, SERUM
Creatinine, Ser: 0.89 mg/dL (ref 0.61–1.24)
GFR, Estimated: >60 mL/min (ref >60)

## 2020-08-06 LAB — RESP PANEL BY RT-PCR (FLU A&B, COVID) ARPGX2
Influenza A by PCR: NEGATIVE
Influenza B by PCR: NEGATIVE
SARS Coronavirus 2 by RT PCR: NEGATIVE

## 2020-08-06 LAB — VITAMIN B12: Vitamin B-12: 408 pg/mL (ref 180–914)

## 2020-08-06 LAB — CBC
HCT: 25.7 % — ABNORMAL LOW (ref 39.0–52.0)
Hemoglobin: 8.2 g/dL — ABNORMAL LOW (ref 13.0–17.0)
MCH: 34.7 pg — ABNORMAL HIGH (ref 26.0–34.0)
MCHC: 31.9 g/dL (ref 30.0–36.0)
MCV: 108.9 fL — ABNORMAL HIGH (ref 80.0–100.0)
Platelets: 138 10*3/uL — ABNORMAL LOW (ref 150–400)
RBC: 2.36 MIL/uL — ABNORMAL LOW (ref 4.22–5.81)
RDW: 19.5 % — ABNORMAL HIGH (ref 11.5–15.5)
WBC: 9.1 10*3/uL (ref 4.0–10.5)
nRBC: 0 % (ref 0.0–0.2)

## 2020-08-06 LAB — FOLATE: Folate: 19.2 ng/mL (ref >5.9)

## 2020-08-06 MED ORDER — ONDANSETRON HCL 4 MG/2ML IJ SOLN
4.0000 mg | Freq: Four times a day (QID) | INTRAMUSCULAR | Status: DC | PRN
  Administered 2020-08-13: 4 mg via INTRAVENOUS
  Filled 2020-08-06: qty 2

## 2020-08-06 MED ORDER — FAMOTIDINE 20 MG PO TABS
20.0000 mg | ORAL_TABLET | Freq: Two times a day (BID) | ORAL | Status: DC
  Administered 2020-08-06 – 2020-08-18 (×24): 20 mg via ORAL
  Filled 2020-08-06 (×25): qty 1

## 2020-08-06 MED ORDER — ACETAMINOPHEN 325 MG PO TABS
650.0000 mg | ORAL_TABLET | Freq: Once | ORAL | Status: AC
  Administered 2020-08-06: 650 mg via ORAL
  Filled 2020-08-06: qty 2

## 2020-08-06 MED ORDER — METOPROLOL TARTRATE 25 MG PO TABS
25.0000 mg | ORAL_TABLET | Freq: Two times a day (BID) | ORAL | Status: DC
  Administered 2020-08-06 – 2020-08-15 (×16): 25 mg via ORAL
  Filled 2020-08-06 (×19): qty 1

## 2020-08-06 MED ORDER — ONDANSETRON HCL 4 MG PO TABS: 4.0000 mg | ORAL_TABLET | Freq: Four times a day (QID) | ORAL | Status: DC | PRN

## 2020-08-06 MED ORDER — ATORVASTATIN CALCIUM 40 MG PO TABS
40.0000 mg | ORAL_TABLET | Freq: Every evening | ORAL | Status: DC
  Administered 2020-08-06 – 2020-08-17 (×12): 40 mg via ORAL
  Filled 2020-08-06 (×13): qty 1

## 2020-08-06 MED ORDER — SODIUM CHLORIDE 0.9 % IV SOLN: INTRAVENOUS | Status: DC

## 2020-08-06 MED ORDER — ASPIRIN EC 81 MG PO TBEC
81.0000 mg | DELAYED_RELEASE_TABLET | Freq: Every day | ORAL | Status: DC
  Administered 2020-08-07 – 2020-08-18 (×12): 81 mg via ORAL
  Filled 2020-08-06 (×12): qty 1

## 2020-08-06 MED ORDER — ENOXAPARIN SODIUM 40 MG/0.4ML IJ SOSY
40.0000 mg | PREFILLED_SYRINGE | INTRAMUSCULAR | Status: DC
  Administered 2020-08-06 – 2020-08-17 (×12): 40 mg via SUBCUTANEOUS
  Filled 2020-08-06 (×12): qty 0.4

## 2020-08-06 MED ORDER — TAMSULOSIN HCL 0.4 MG PO CAPS
0.8000 mg | ORAL_CAPSULE | Freq: Every evening | ORAL | Status: DC
  Administered 2020-08-06 – 2020-08-17 (×12): 0.8 mg via ORAL
  Filled 2020-08-06 (×13): qty 2

## 2020-08-06 MED ORDER — HYDROCODONE-ACETAMINOPHEN 5-325 MG PO TABS
1.0000 | ORAL_TABLET | ORAL | Status: DC | PRN
  Administered 2020-08-06 – 2020-08-13 (×3): 1 via ORAL
  Filled 2020-08-06 (×4): qty 1

## 2020-08-06 MED ORDER — ACETAMINOPHEN 500 MG PO TABS
500.0000 mg | ORAL_TABLET | Freq: Four times a day (QID) | ORAL | Status: DC | PRN
  Administered 2020-08-09 – 2020-08-18 (×14): 500 mg via ORAL
  Filled 2020-08-06 (×15): qty 1

## 2020-08-06 NOTE — ED Triage Notes (Addendum)
Pt's daughter reports he fell on 08/05/20 injuring right hip/buttock.  This was an unwitnessed fall.  Pt's daughter reports recent history of frequent falls.

## 2020-08-06 NOTE — Plan of Care (Signed)
  Problem: Health Behavior/Discharge Planning: Goal: Ability to manage health-related needs will improve Outcome: Progressing   Problem: Clinical Measurements: Goal: Ability to maintain clinical measurements within normal limits will improve Outcome: Progressing   Problem: Activity: Goal: Ability to ambulate and perform ADLs will improve Outcome: Progressing   Problem: Clinical Measurements: Goal: Postoperative complications will be avoided or minimized Outcome: Progressing   Problem: Pain Management: Goal: Pain level will decrease Outcome: Progressing

## 2020-08-06 NOTE — Discharge Instructions (Addendum)
Tylenol as needed for pain every 4 hrs. Ok to weight bear as tolerated on right leg.

## 2020-08-06 NOTE — ED Notes (Signed)
Handoff report given to [facility] 

## 2020-08-06 NOTE — Plan of Care (Signed)
80 year old gentleman with CAD and multiple comorbidities was brought into the ED at [place] due to multiple falls and was diagnosed with right greater trochanteric periprosthetic fracture/acetabular fracture.  ED physician discussed with orthopedics Dr. Mardelle Matte, who opined that patient's fracture is nonsurgical and recommended admission under hospitalist service as well as CTA.  CTA is pending.  Patient is accepted for transfer to admission at [facility] or [facility] under hospitalist service with orthopedics to consult. [facility] will assume care on arrival to accepting facility. Until arrival, care as per EDP. However, [facility] available 24/7 for questions and assistance.

## 2020-08-06 NOTE — H&P (Signed)
History and Physical    Adam Owen [id number] DOB: 09-22-1940 DOA: 08/06/2020  PCP: Pcp, No  Patient coming from: Home  I have personally briefly reviewed patient's old medical records in [place]  Chief Complaint: Fall and right hip fracture  HPI: Adam Owen is a 80 y.o. male with medical history significant of CAD, pulmonary fibrosis, memory loss, GERD, gout, hypertension, hyperlipidemia and multiple other comorbidities as mentioned below was brought into by her daughter to the [facility] ED after a fall.History is mostly obtained from the daughter over the phone and ED physician.  Reportedly, patient has been having more frequent falls lately and due to his social situations, he has been living with his daughter.  He had an unwitnessed fall yesterday where he landed on his right hip and was unable to get up or bear any weight.  Patient did not have any other complaints.  Currently patient is fairly stable with no complaints at all.  ED Course: Upon arrival to ED, he was hemodynamically stable.Initial x-rays of the hip showed right greater trochanteric periprosthetic fracture, undisplaced right acetabular fracture.  Case was discussed with orthopedics on-call Dr. Mardelle Matte who opined that this will be managed nonsurgically and recommended admission to hospitalist service so patient was accepted to [facility] long hospital.  He is COVID-negative  Review of Systems: As per HPI otherwise negative.    Past Medical History:  Diagnosis Date   Anemia    Cancer ([place])    Constipation    Coronary artery disease    Edema    Gait abnormality    GERD (gastroesophageal reflux disease)    Gout    High cholesterol    Hypertension    Leg weakness    Memory loss    Myocardial infarction ([place])    Obstructive sleep apnea    Osteoporosis    Premature heartbeats    Pulmonary fibrosis ([place])    Spinal stenosis of lumbar region    UTI (urinary tract infection)    Vertigo     Vitamin D deficiency     Past Surgical History:  Procedure Laterality Date   CORONARY ARTERY BYPASS GRAFT     JOINT REPLACEMENT Bilateral      reports that he has quit smoking. His smoking use included cigarettes. He has never used smokeless tobacco. He reports that he does not drink alcohol and does not use drugs.  Allergies  Allergen Reactions   Iodine     History reviewed. No pertinent family history.  Prior to Admission medications   Medication Sig Start Date End Date Taking? Authorizing Provider  acetaminophen (TYLENOL) 500 MG tablet Take 500 mg by mouth every 6 (six) hours as needed.   Yes [provider]  alendronate-cholecalciferol (FOSAMAX PLUS D) 70-2800 MG-UNIT tablet Take by mouth every 7 (seven) days. Take with a full glass of water on an empty stomach.   Yes [provider]  aspirin EC 81 MG tablet Take 81 mg by mouth daily. Swallow whole.   Yes [provider]  atorvastatin (LIPITOR) 40 MG tablet Take 40 mg by mouth daily.   Yes [provider]  famotidine (PEPCID) 20 MG tablet Take 20 mg by mouth 2 (two) times daily.   Yes [provider]  metoprolol tartrate (LOPRESSOR) 25 MG tablet Take 25 mg by mouth 2 (two) times daily.   Yes [provider]  Multiple Vitamin (MULTIVITAMIN WITH MINERALS) TABS tablet Take 1 tablet by mouth daily.  Yes [provider]  nitroGLYCERIN (NITROSTAT) 0.4 MG SL tablet Place 0.4 mg under the tongue every 5 (five) minutes as needed for chest pain.   Yes [provider]  tamsulosin (FLOMAX) 0.4 MG CAPS capsule Take 0.8 mg by mouth every evening.   Yes [provider]    Physical Exam: Vitals:   08/06/20 0930 08/06/20 1012 08/06/20 1200 08/06/20 1317  BP: (!) 137/55 (!) 137/55 (!) 119/45 120/65  Pulse: 67 67 (!) 47 62  Resp:  17 14 16   Temp:    97.6 F (36.4 C)  TempSrc:    Oral  SpO2: 100% 100% 97% 98%  Weight:      Height:        Constitutional:  NAD, calm, comfortable Vitals:   08/06/20 0930 08/06/20 1012 08/06/20 1200 08/06/20 1317  BP: (!) 137/55 (!) 137/55 (!) 119/45 120/65  Pulse: 67 67 (!) 47 62  Resp:  17 14 16   Temp:    97.6 F (36.4 C)  TempSrc:    Oral  SpO2: 100% 100% 97% 98%  Weight:      Height:       Eyes: PERRL, lids and conjunctivae normal ENMT: Mucous membranes are moist. Posterior pharynx clear of any exudate or lesions.Normal dentition.  Neck: normal, supple, no masses, no thyromegaly Respiratory: clear to auscultation bilaterally, no wheezing, no crackles. Normal respiratory effort. No accessory muscle use.  Cardiovascular: Regular rate and rhythm, no murmurs / rubs / gallops. No extremity edema. 2+ pedal pulses. No carotid bruits.  Abdomen: no tenderness, no masses palpated. No hepatosplenomegaly. Bowel sounds positive.  Musculoskeletal: no clubbing / cyanosis.  Right lower extremity shortened and externally rotated Skin: no rashes, lesions, ulcers. No induration Neurologic: CN 2-12 grossly intact. Sensation intact, DTR normal. Strength 5/5 in all 4.  Psychiatric: Normal judgment and insight. Alert and oriented x 2. Normal mood.    Labs on Admission: I have personally reviewed following labs and imaging studies  CBC: Recent Labs  Lab 08/06/20 0825  WBC 10.7*  NEUTROABS 9.2*  HGB 8.9*  HCT 27.6*  MCV 106.6*  PLT 326   Basic Metabolic Panel: Recent Labs  Lab 08/06/20 0825  NA 139  K 4.1  CL 103  CO2 31  GLUCOSE 112*  BUN 16  CREATININE 0.84  CALCIUM 9.7   GFR: Estimated Creatinine Clearance: 52.4 mL/min (by C-G formula based on SCr of 0.84 mg/dL). Liver Function Tests: No results for input(s): AST, ALT, ALKPHOS, BILITOT, PROT, ALBUMIN in the last 168 hours. No results for input(s): LIPASE, AMYLASE in the last 168 hours. No results for input(s): AMMONIA in the last 168 hours. Coagulation Profile: No results for input(s): INR, PROTIME in the last 168 hours. Cardiac Enzymes: No  results for input(s): CKTOTAL, CKMB, CKMBINDEX, TROPONINI in the last 168 hours. BNP (last 3 results) No results for input(s): PROBNP in the last 8760 hours. HbA1C: No results for input(s): HGBA1C in the last 72 hours. CBG: No results for input(s): GLUCAP in the last 168 hours. Lipid Profile: No results for input(s): CHOL, HDL, LDLCALC, TRIG, CHOLHDL, LDLDIRECT in the last 72 hours. Thyroid Function Tests: No results for input(s): TSH, T4TOTAL, FREET4, T3FREE, THYROIDAB in the last 72 hours. Anemia Panel: No results for input(s): VITAMINB12, FOLATE, FERRITIN, TIBC, IRON, RETICCTPCT in the last 72 hours. Urine analysis:    Component Value Date/Time   COLORURINE YELLOW 08/06/2020 [place] 08/06/2020 0839   LABSPEC 1.013 08/06/2020 7124  PHURINE 6.0 08/06/2020 0839   GLUCOSEU NEGATIVE 08/06/2020 0839   HGBUR NEGATIVE 08/06/2020 0839   BILIRUBINUR NEGATIVE 08/06/2020 0839   KETONESUR NEGATIVE 08/06/2020 0839   PROTEINUR NEGATIVE 08/06/2020 0839   NITRITE NEGATIVE 08/06/2020 0839   LEUKOCYTESUR MODERATE (A) 08/06/2020 0839    Radiological Exams on Admission: DG Pelvis 1-2 Views  Result Date: 08/06/2020 CLINICAL DATA:  Recent fall with right hip pain, initial encounter EXAM: PELVIS - 1-2 VIEW COMPARISON:  None. FINDINGS: Bilateral hip replacements are noted. No dislocation is noted. Lucency is seen in the greater trochanter suspicious for periprosthetic fracture. Multiple healed fractures are noted within the pelvis. Lucency is seen within the right acetabulum medially suspicious for acute nondisplaced fracture. CT of the pelvis may be helpful for further evaluation. IMPRESSION: Changes consistent with right greater trochanter periprosthetic fracture. Lucency in the medial aspect of the right acetabulum suspicious for undisplaced fracture. Electronically Signed   By: Inez Catalina M.D.   On: 08/06/2020 08:59   CT Hip Right Wo Contrast  Result Date: 08/06/2020 CLINICAL  DATA:  Right hip pain since fall yesterday. EXAM: CT OF THE RIGHT HIP WITHOUT CONTRAST TECHNIQUE: Multidetector CT imaging of the right hip was performed according to the standard protocol. Multiplanar CT image reconstructions were also generated. COMPARISON:  Pelvis and right femur x-rays from same day. FINDINGS: Bones/Joint/Cartilage Prior right total hip arthroplasty. No evidence of hardware failure or loosening. Acute nondisplaced fracture of the right greater trochanter (series 6, image 22). Acute nondisplaced fractures of the right medial and posterior acetabulum (series 6, images 48 and 29). Acute mildly displaced fracture of the right inferior pubic ramus (series 3, image 67). Old healed fractures of the left superior and inferior pubic rami. No joint effusion. Ligaments Ligaments are suboptimally evaluated by CT. Muscles and Tendons Grossly intact.  Right gluteus muscle atrophy. Soft tissue Small hematoma overlying the greater trochanter. No soft tissue mass. IMPRESSION: 1. Acute nondisplaced fracture of the right greater trochanter. 2. Acute nondisplaced fractures of the right medial and posterior acetabulum. 3. Acute mildly displaced fracture of the right inferior pubic ramus. 4. Prior right total hip arthroplasty without evidence of hardware complication. Electronically Signed   By: Titus Dubin M.D.   On: 08/06/2020 10:24   DG Femur Min 2 Views Right  Result Date: 08/06/2020 CLINICAL DATA:  Recent fall with right leg pain, initial encounter EXAM: RIGHT FEMUR 2 VIEWS COMPARISON:  None. FINDINGS: Right hip replacement is noted. No dislocation is seen. There is lucency identified in the region of the greater trochanter suspicious for underlying periprosthetic fracture. Additionally lucency is seen within the acetabulum on the right consistent with an undisplaced fracture. Distal femur appears within normal limits. Degenerative changes in the knee joint are seen. Multiple old healed fractures are  noted within the pelvis. IMPRESSION: Findings suggestive of right greater trochanter periprosthetic fracture. Findings consistent with undisplaced right acetabular fracture. CT may be helpful for further evaluation. Electronically Signed   By: Inez Catalina M.D.   On: 08/06/2020 08:57    EKG: Independently reviewed.  Sinus bradycardia at 48 bpm with no ST-T wave changes  Assessment/Plan Active Problems:   Closed right hip fracture (HCC)   Acute nondisplaced fracture of right greater trochanter/fracture of right medial and posterior acetabulum/mildly displaced fracture of right inferior pubic ramus: I have notified Dr. Mardelle Matte about arrival of the patient who will see patient and further recommend.  Will defer all management to orthopedics including pain management.  Bedrest.  Hyperlipidemia:  Resume atorvastatin.  Hypertension: Resume metoprolol.  BPH: Resume Flomax.  Macrocytic anemia: No prior CBC available to compare with.  MCV elevated.  Will check B12 and folate.  Repeat labs in the morning.  Nonspecific/reactive leukocytosis: No signs of infection.  Likely due to dehydration.  Will start on gentle hydration.  DVT prophylaxis: enoxaparin (LOVENOX) injection 40 mg Start: 08/06/20 1600 Code Status: Full code according to daughter Family Communication: None present at bedside.  Plan of care discussed with daughter [name] over the phone. Disposition Plan: Will likely need SNF Consults called: Orthopedics Admission status: Inpatient   Status is: Inpatient  Remains inpatient appropriate because:Inpatient level of care appropriate due to severity of illness  Dispo: The patient is from: Home              Anticipated d/c is to: SNF              Patient currently is not medically stable to d/c.   Difficult to place patient No       Darliss Cheney MD [facility]  08/06/2020, 1:53 PM  To contact the attending provider between 7A-7P or the covering provider during after hours  7P-7A, please log into the web site [email or web address]

## 2020-08-06 NOTE — ED Provider Notes (Signed)
[place] EMERGENCY DEPT Provider Note   CSN: [id number] Arrival date & time: 08/06/20  0865     History Chief Complaint  Patient presents with   Adam Owen is a 80 y.o. male.  Patient with history of coronary artery disease, constipation, osteoporosis, pulmonary fibrosis, anemia, cancer, memory loss presents with daughter due to more frequent falls recently.  Patient fell yesterday unwitnessed onto right hip and buttocks area.  Patient cannot walk without assistance and tried on his own.  Recently moved in with his daughter due to difficult social/home situation with his wife.  Patient's daughter is looking into getting more home health/assistance.  Patient generally weak at baseline.  No fever or infectious symptoms recently.  Patient chronically deals with constipation.  No head injury.  Pain with movement of the right thigh.  History assisted by daughter.  Patient hard of hearing.      Past Medical History:  Diagnosis Date   Anemia    Cancer ([place])    Constipation    Coronary artery disease    Edema    Gait abnormality    GERD (gastroesophageal reflux disease)    Gout    High cholesterol    Hypertension    Leg weakness    Memory loss    Myocardial infarction ([place])    Obstructive sleep apnea    Osteoporosis    Premature heartbeats    Pulmonary fibrosis ([place])    Spinal stenosis of lumbar region    UTI (urinary tract infection)    Vertigo    Vitamin D deficiency     Patient Active Problem List   Diagnosis Date Noted   Closed right hip fracture ([place]) 08/06/2020    Past Surgical History:  Procedure Laterality Date   CORONARY ARTERY BYPASS GRAFT     JOINT REPLACEMENT Bilateral        History reviewed. No pertinent family history.  Social History   Tobacco Use   Smoking status: Former    Pack years: 0.00    Types: Cigarettes   Smokeless tobacco: Never  Vaping Use   Vaping Use: Never used  Substance Use Topics    Alcohol use: Never   Drug use: Never    Home Medications Prior to Admission medications   Medication Sig Start Date End Date Taking? Authorizing Provider  acetaminophen (TYLENOL) 500 MG tablet Take 500 mg by mouth every 6 (six) hours as needed.   Yes [provider]  alendronate-cholecalciferol (FOSAMAX PLUS D) 70-2800 MG-UNIT tablet Take by mouth every 7 (seven) days. Take with a full glass of water on an empty stomach.   Yes [provider]  aspirin EC 81 MG tablet Take 81 mg by mouth daily. Swallow whole.   Yes [provider]  atorvastatin (LIPITOR) 40 MG tablet Take 40 mg by mouth daily.   Yes [provider]  colchicine 0.6 MG tablet Take 0.6 mg by mouth daily.   Yes [provider]  famotidine (PEPCID) 20 MG tablet Take 20 mg by mouth 2 (two) times daily.   Yes [provider]  metoprolol tartrate (LOPRESSOR) 25 MG tablet Take 25 mg by mouth 2 (two) times daily.   Yes [provider]  Multiple Vitamin (MULTIVITAMIN WITH MINERALS) TABS tablet Take 1 tablet by mouth daily.   Yes [provider]  nitroGLYCERIN (NITROSTAT) 0.4 MG SL tablet Place 0.4 mg under the tongue every 5 (five) minutes as needed for chest pain.  Yes [provider]  tamsulosin (FLOMAX) 0.4 MG CAPS capsule Take by mouth.   Yes [provider]    Allergies    Iodine  Review of Systems   Review of Systems  Constitutional:  Negative for chills and fever.  HENT:  Negative for congestion.   Eyes:  Negative for visual disturbance.  Respiratory:  Negative for shortness of breath.   Cardiovascular:  Negative for chest pain.  Gastrointestinal:  Negative for abdominal pain and vomiting.  Genitourinary:  Negative for dysuria and flank pain.  Musculoskeletal:  Positive for gait problem. Negative for back pain, neck pain and neck stiffness.  Skin:  Negative for rash.  Neurological:  Positive for weakness. Negative for  light-headedness and headaches.   Physical Exam Updated Vital Signs BP (!) 137/55   Pulse 67   Temp 98.1 F (36.7 C) (Oral)   Resp 17   Ht 5\' 9"  (1.753 m)   Wt 52 kg   SpO2 100%   BMI 16.94 kg/m   Physical Exam Vitals and nursing note reviewed.  Constitutional:      General: He is not in acute distress.    Appearance: He is well-developed.     Comments: Frail appearing, generally weak  HENT:     Head: Normocephalic and atraumatic.     Mouth/Throat:     Mouth: Mucous membranes are dry.  Eyes:     General:        Right eye: No discharge.        Left eye: No discharge.     Conjunctiva/sclera: Conjunctivae normal.  Neck:     Trachea: No tracheal deviation.  Cardiovascular:     Rate and Rhythm: Normal rate and regular rhythm.     Heart sounds: No murmur heard. Pulmonary:     Effort: Pulmonary effort is normal.     Breath sounds: Normal breath sounds.  Abdominal:     General: There is no distension.     Palpations: Abdomen is soft.     Tenderness: There is no abdominal tenderness. There is no guarding.  Musculoskeletal:        General: Swelling (mild LE swelling bilateral worse left) and tenderness present.     Cervical back: Normal range of motion and neck supple.     Comments: Mild tenderness right lateral and anterior proximal thigh and hip with flexion, no deformity. No pain left hip or knee.  Patient has no pain with flexion of elbow and shoulders bilateral, equal with history of frozen shoulder.  Skin:    General: Skin is warm.     Capillary Refill: Capillary refill takes less than 2 seconds.     Findings: No rash.  Neurological:     General: No focal deficit present.     Mental Status: He is alert and oriented to person, place, and time.     Motor: Weakness (general) present.     Comments: Difficulty with hearing, focus on right ear  Psychiatric:        Mood and Affect: Mood normal.    ED Results / Procedures / Treatments   Labs (all labs ordered are  listed, but only abnormal results are displayed) Labs Reviewed  CBC WITH DIFFERENTIAL/PLATELET - Abnormal; Notable for the following components:      Result Value   WBC 10.7 (*)    RBC 2.59 (*)    Hemoglobin 8.9 (*)    HCT 27.6 (*)    MCV 106.6 (*)  MCH 34.4 (*)    RDW 19.5 (*)    Neutro Abs 9.2 (*)    Lymphs Abs 0.6 (*)    All other components within normal limits  BASIC METABOLIC PANEL - Abnormal; Notable for the following components:   Glucose, Bld 112 (*)    All other components within normal limits  URINALYSIS, ROUTINE W REFLEX MICROSCOPIC - Abnormal; Notable for the following components:   Leukocytes,Ua MODERATE (*)    All other components within normal limits  RESP PANEL BY RT-PCR (FLU A&B, COVID) [id number]    EKG EKG Interpretation  Date/Time:  Wednesday August 06 2020 10:12:07 EDT Ventricular Rate:  48 PR Interval:  187 QRS Duration: 102 QT Interval:  463 QTC Calculation: 414 R Axis:   46 Text Interpretation: Sinus bradycardia Probable left ventricular hypertrophy Confirmed by Elnora Morrison [phone]) on 08/06/2020 10:53:19 AM  Radiology DG Pelvis 1-2 Views  Result Date: 08/06/2020 CLINICAL DATA:  Recent fall with right hip pain, initial encounter EXAM: PELVIS - 1-2 VIEW COMPARISON:  None. FINDINGS: Bilateral hip replacements are noted. No dislocation is noted. Lucency is seen in the greater trochanter suspicious for periprosthetic fracture. Multiple healed fractures are noted within the pelvis. Lucency is seen within the right acetabulum medially suspicious for acute nondisplaced fracture. CT of the pelvis may be helpful for further evaluation. IMPRESSION: Changes consistent with right greater trochanter periprosthetic fracture. Lucency in the medial aspect of the right acetabulum suspicious for undisplaced fracture. Electronically Signed   By: Inez Catalina M.D.   On: 08/06/2020 08:59   CT Hip Right Wo Contrast  Result Date: 08/06/2020 CLINICAL DATA:  Right hip pain  since fall yesterday. EXAM: CT OF THE RIGHT HIP WITHOUT CONTRAST TECHNIQUE: Multidetector CT imaging of the right hip was performed according to the standard protocol. Multiplanar CT image reconstructions were also generated. COMPARISON:  Pelvis and right femur x-rays from same day. FINDINGS: Bones/Joint/Cartilage Prior right total hip arthroplasty. No evidence of hardware failure or loosening. Acute nondisplaced fracture of the right greater trochanter (series 6, image 22). Acute nondisplaced fractures of the right medial and posterior acetabulum (series 6, images 48 and 29). Acute mildly displaced fracture of the right inferior pubic ramus (series 3, image 67). Old healed fractures of the left superior and inferior pubic rami. No joint effusion. Ligaments Ligaments are suboptimally evaluated by CT. Muscles and Tendons Grossly intact.  Right gluteus muscle atrophy. Soft tissue Small hematoma overlying the greater trochanter. No soft tissue mass. IMPRESSION: 1. Acute nondisplaced fracture of the right greater trochanter. 2. Acute nondisplaced fractures of the right medial and posterior acetabulum. 3. Acute mildly displaced fracture of the right inferior pubic ramus. 4. Prior right total hip arthroplasty without evidence of hardware complication. Electronically Signed   By: Titus Dubin M.D.   On: 08/06/2020 10:24   DG Femur Min 2 Views Right  Result Date: 08/06/2020 CLINICAL DATA:  Recent fall with right leg pain, initial encounter EXAM: RIGHT FEMUR 2 VIEWS COMPARISON:  None. FINDINGS: Right hip replacement is noted. No dislocation is seen. There is lucency identified in the region of the greater trochanter suspicious for underlying periprosthetic fracture. Additionally lucency is seen within the acetabulum on the right consistent with an undisplaced fracture. Distal femur appears within normal limits. Degenerative changes in the knee joint are seen. Multiple old healed fractures are noted within the pelvis.  IMPRESSION: Findings suggestive of right greater trochanter periprosthetic fracture. Findings consistent with undisplaced right acetabular fracture. CT may be helpful for  further evaluation. Electronically Signed   By: Inez Catalina M.D.   On: 08/06/2020 08:57    Procedures Procedures   Medications Ordered in ED Medications  acetaminophen (TYLENOL) tablet 650 mg (650 mg Oral Given 08/06/20 0912)    ED Course  I have reviewed the triage vital signs and the nursing notes.  Pertinent labs & imaging results that were available during my care of the patient were reviewed by me and considered in my medical decision making (see chart for details).    MDM Rules/Calculators/A&P                          Patient recently moved in with family presents with general weakness, frequent falls and localized to right hip pain since last fall.  Home health order placed for disposition.  Daughter looking into further assistance.  Plan for generalized blood work, x-ray and pain meds as needed.  Blood work reviewed showing chronic anemia 8.9, white blood cell count 10.7, electrolytes unremarkable.  Glucose normal.  Urinalysis reviewed moderate leukocytes no other signs of infection and no symptoms.  Patient has no fever or infectious signs at this time.  X-ray reviewed consistent with periprosthetic fracture.  CT scan ordered and discussed with Dr. Mayford Knife for orthopedics who will see the patient at [facility] on consult.  Discussed with hospitalist for transfer.  Updated family.  Final Clinical Impression(s) / ED Diagnoses Final diagnoses:  Fall, initial encounter  Contusion of right hip, initial encounter    Rx / DC Orders ED Discharge Orders          [place]       Comments: Recently moved in with daughter, ambulation challenges, generally weak, needs social work to coordinate as well   08/06/20 0836    Face-to-face encounter (required for Medicare/Medicaid patients)       Comments: I  Mariea Clonts certify that this patient is under my care and that I, or a nurse practitioner or physician's assistant working with me, had a face-to-face encounter that meets the physician face-to-face encounter requirements with this patient on 08/06/2020. The encounter with the patient was in whole, or in part for the following medical condition(s) which is the primary reason for home health care (List medical condition): unsafe ambulation without assistance, frequent falls   08/06/20 0836             Elnora Morrison, MD 08/06/20 1053

## 2020-08-06 NOTE — Consult Note (Signed)
ORTHOPAEDIC CONSULTATION  REQUESTING PHYSICIAN: Darliss Cheney, MD  Chief Complaint: right hip pain  HPI: Adam Owen is a 80 y.o. male with history of hearing loss in left ear, coronary artery disease, constipation, osteoporosis, pulmonary fibrosis, anemia, cancer, memory loss who complains of right hip pain after fall.  History obtained from daughter as well as patient.  Patient has history of right total hip replacement, with postoperative prosthetic hip dislocation. In 2012 he underwent a right hip revision with constrained liner and has not had any hip pain since that time. Patient has a history of at least 2 other falls since mid April, he has been able to recover from these pain wise but has been struggling with walking.  Daughter states she and her sisters have been performing all transfers and with physical therapy they had just gotten him up to be able to get up on his own.  However daughter briefly got up yesterday to take a phone call and when she returned from the call about 10 minutes later she found her father on the ground.  Patient states he was getting up to get something and fell directly onto his right hip, he does not member having severe pain.  He states that he was able to get up on his own.  But continued to have pain today and presented to the emergency department. Denies right hip pain on exam today. However pain is recreated with AROM.  Denies numbness or tingling radiating down his legs.  No nausea vomiting or abdominal pain.   Past Medical History:  Diagnosis Date   Anemia    Cancer ([place])    Constipation    Coronary artery disease    Edema    Gait abnormality    GERD (gastroesophageal reflux disease)    Gout    High cholesterol    Hypertension    Leg weakness    Memory loss    Myocardial infarction ([place])    Obstructive sleep apnea    Osteoporosis    Premature heartbeats    Pulmonary fibrosis ([facility])    Spinal stenosis of lumbar region    UTI  (urinary tract infection)    Vertigo    Vitamin D deficiency    Past Surgical History:  Procedure Laterality Date   CORONARY ARTERY BYPASS GRAFT     JOINT REPLACEMENT Bilateral    Social History   Socioeconomic History   Marital status: Married    Spouse name: Not on file   Number of children: Not on file   Years of education: Not on file   Highest education level: Not on file  Occupational History   Not on file  Tobacco Use   Smoking status: Former    Pack years: 0.00    Types: Cigarettes   Smokeless tobacco: Never  Vaping Use   Vaping Use: Never used  Substance and Sexual Activity   Alcohol use: Never   Drug use: Never   Sexual activity: Not on file  Other Topics Concern   Not on file  Social History Narrative   Not on file   Social Determinants of Health   Financial Resource Strain: Not on file  Food Insecurity: Not on file  Transportation Needs: Not on file  Physical Activity: Not on file  Stress: Not on file  Social Connections: Not on file   History reviewed. No pertinent family history. Allergies  Allergen Reactions   Iodine      Positive ROS: All other  systems have been reviewed and were otherwise negative with the exception of those mentioned in the HPI and as above.  Physical Exam: General: Alert, no acute distress. Hearing loss in left ear. Cardiovascular: No pedal edema Respiratory: No cyanosis, no use of accessory musculature GI: No organomegaly, abdomen is soft and non-tender Skin: No lesions in the area of chief complaint. Neurologic: Sensation intact distally Psychiatric: Patient is competent for consent with normal mood and affect. Alert and oriented x 3. Lymphatic: No axillary or cervical lymphadenopathy  MUSCULOSKELETAL: RLE - Leg lengths appear equal.  No tenderness palpation over right greater trochanter.  Able to perform straight leg raise without pain.  Dorsiflexion plantarflexion intact.  2+ DP pulse distal sensation intact. No  ecchymosis of edema.   Imaging: CT Pelvis: 1. Acute nondisplaced fracture of the right greater trochanter. 2. Acute nondisplaced fractures of the right medial and posterior acetabulum. 3. Acute mildly displaced fracture of the right inferior pubic ramus. 4. Prior right total hip arthroplasty without evidence of hardware complication.  Assessment: Right hip periprosthetic greater trochanter fracture, acetabular fractures, right inferior pubic ramus fracture  Plan: - plan for non-operative treatment of all fractures, TTWB RLE - PT/ OT evals - Dispo pending PT eval - Patient and daughters relay that organizing patient's care is difficult due to patient living in [place] with wife. As well as difficult family situation between patient's children, patient, and wife. He is currently staying with his daughter in [place] for a few weeks.   Ventura Bruns, PA-C    08/06/2020 2:55 PM

## 2020-08-06 NOTE — ED Notes (Signed)
ED Provider at bedside. 

## 2020-08-06 NOTE — ED Notes (Signed)
Handoff report given to [facility] RN

## 2020-08-06 NOTE — Progress Notes (Signed)
Called from [facility].  Patient with right greater trochanteric hip fracture, and concern about acetabular fracture.    Plan TTWB right lower extremity, PT, no surgery indicated as of now, will eval after CT of pelvis.    Johnny Bridge, MD

## 2020-08-06 NOTE — Plan of Care (Signed)
  Problem: Education: Goal: Knowledge of General Education information will improve Description: Including pain rating scale, medication(s)/side effects and non-pharmacologic comfort measures Outcome: Progressing   Problem: Activity: Goal: Risk for activity intolerance will decrease Outcome: Progressing   Problem: Nutrition: Goal: Adequate nutrition will be maintained Outcome: Progressing   Problem: Elimination: Goal: Will not experience complications related to bowel motility Outcome: Progressing   Problem: Pain Managment: Goal: General experience of comfort will improve Outcome: Progressing   Problem: Skin Integrity: Goal: Risk for impaired skin integrity will decrease Outcome: Progressing

## 2020-08-07 DIAGNOSIS — D72829 Elevated white blood cell count, unspecified: Secondary | ICD-10-CM

## 2020-08-07 DIAGNOSIS — D696 Thrombocytopenia, unspecified: Secondary | ICD-10-CM

## 2020-08-07 DIAGNOSIS — D539 Nutritional anemia, unspecified: Secondary | ICD-10-CM

## 2020-08-07 DIAGNOSIS — E43 Unspecified severe protein-calorie malnutrition: Secondary | ICD-10-CM | POA: Insufficient documentation

## 2020-08-07 LAB — BASIC METABOLIC PANEL
Anion gap: 3 — ABNORMAL LOW (ref 5–15)
BUN: 15 mg/dL (ref 8–23)
CO2: 30 mmol/L (ref 22–32)
Calcium: 8.7 mg/dL — ABNORMAL LOW (ref 8.9–10.3)
Chloride: 107 mmol/L (ref 98–111)
Creatinine, Ser: 0.85 mg/dL (ref 0.61–1.24)
GFR, Estimated: >60 mL/min (ref >60)
Glucose, Bld: 101 mg/dL — ABNORMAL HIGH (ref 70–99)
Potassium: 4.2 mmol/L (ref 3.5–5.1)
Sodium: 140 mmol/L (ref 135–145)

## 2020-08-07 LAB — CBC
HCT: 24.2 % — ABNORMAL LOW (ref 39.0–52.0)
Hemoglobin: 7.7 g/dL — ABNORMAL LOW (ref 13.0–17.0)
MCH: 34.5 pg — ABNORMAL HIGH (ref 26.0–34.0)
MCHC: 31.8 g/dL (ref 30.0–36.0)
MCV: 108.5 fL — ABNORMAL HIGH (ref 80.0–100.0)
Platelets: 126 10*3/uL — ABNORMAL LOW (ref 150–400)
RBC: 2.23 MIL/uL — ABNORMAL LOW (ref 4.22–5.81)
RDW: 19.6 % — ABNORMAL HIGH (ref 11.5–15.5)
WBC: 10.1 10*3/uL (ref 4.0–10.5)
nRBC: 0 % (ref 0.0–0.2)

## 2020-08-07 MED ORDER — ENSURE ENLIVE PO LIQD
237.0000 mL | Freq: Two times a day (BID) | ORAL | Status: DC
  Administered 2020-08-08 – 2020-08-18 (×17): 237 mL via ORAL
  Filled 2020-08-07: qty 237

## 2020-08-07 MED ORDER — BOOST PLUS PO LIQD
237.0000 mL | ORAL | Status: DC
  Administered 2020-08-07 – 2020-08-17 (×11): 237 mL via ORAL
  Filled 2020-08-07 (×12): qty 237

## 2020-08-07 MED ORDER — HALOPERIDOL LACTATE 5 MG/ML IJ SOLN
2.0000 mg | Freq: Four times a day (QID) | INTRAMUSCULAR | Status: AC | PRN
Start: 2020-08-07 — End: 2020-08-07
  Administered 2020-08-07: 2 mg via INTRAVENOUS
  Filled 2020-08-07: qty 1

## 2020-08-07 NOTE — Progress Notes (Signed)
Occupational Therapy Evaluation  Patient recently staying with daughter in [place], had been living in [place] with spouse however per daughter there were difficulties between patient and spouse. Patient had lost 15-20 pounds and stating not being fed enough at home therefore daughters had been coming up to patient to assist with ADLs dressing, bathing, toileting and work on strengthening "he's gained back about 5-6 pounds." Per daughter patient had gotten back to short distance ambulation with rollator and use of wheelchair for community distances. Currently patient needing mod A x1-2 for bed mobility and mod x2 for safety to take few steps to recliner chair. Patient with poor safety awareness needing cues for body mechanics including backing up to chair completely as patient attempts to sit before in safe position. Also needing assist with eccentric control into chair. Recommend continued acute OT services to maximize patient activity tolerance, balance, safety in order to facilitate D/C to venue listed below.    08/07/20 1252  OT Visit Information  Last OT Received On 08/07/20  Assistance Needed +2  PT/OT/SLP Co-Evaluation/Treatment Yes  Reason for Co-Treatment For patient/therapist safety;To address functional/ADL transfers  PT goals addressed during session Mobility/safety with mobility  OT goals addressed during session ADL's and self-care  History of Present Illness Patient is a 80 year old male with fall at home resulting in nondisplaced fracture of the right greater trochanter, nondisplaced fractures of the right medial and posterior acetabulum and mildly displaced fracture of the right inferior pubic ramus. Pt treated non-operatively. PMH includes previous falls, R hip replacement, hearing loss in left ear, coronary artery disease, constipation, osteoporosis, pulmonary fibrosis, anemia, cancer, memory loss  Precautions  Precautions Fall  Precaution Comments HOH; better in R ear   Restrictions  RLE Weight Bearing WBAT  Home Living  Family/patient expects to be discharged to: [place]  Prior Function  Level of Independence Needs assistance  Gait / Transfers Assistance Needed per daughter patient had progressed to short household ambulation with rollator with supervision, using wheelchair for community distances.  ADL's / Homemaking Assistance Needed DTR reports for past month patient needing assist with dressing, bathing, toileting. can feed himself.  Comments per daughter patient living in [place] with spouse however patient and spouse having issues, patient lost 15-20 pounds and DTRs have been working on trying to get patient stronger  Communication  Communication HOH  Pain Assessment  Pain Assessment Faces  Faces Pain Scale 4  Pain Location R LE with mobility  Pain Descriptors / Indicators Grimacing;Aching  Pain Intervention(s) Limited activity within patient's tolerance;Monitored during session;Premedicated before session  Cognition  Arousal/Alertness Awake/alert  Behavior During Therapy WFL for tasks assessed/performed  Overall Cognitive Status History of cognitive impairments - at baseline  General Comments patient appropriate with therapy, poor safety awareness trying to sit into chair before backed up completely  Upper Extremity Assessment  Upper Extremity Assessment Generalized weakness  Lower Extremity Assessment  Lower Extremity Assessment Defer to PT evaluation  Cervical / Trunk Assessment  Cervical / Trunk Assessment Kyphotic  ADL  Overall ADL's  Needs assistance/impaired  Eating/Feeding Set up;Sitting  Grooming Wash/dry face;Set up;Sitting  Upper Body Bathing Supervision/ safety;Set up;Sitting  Lower Body Bathing Maximal assistance;Sitting/lateral leans;Sit to/from stand  Upper Body Dressing  Supervision/safety;Set up;Sitting  Lower Body Dressing Total assistance;Sitting/lateral leans  Lower  Body Dressing Details (indicate cue type and reason) to adjust socks  Toilet Transfer Moderate assistance;+2 for physical assistance;+2 for safety/equipment;Cueing for safety;Cueing for sequencing;Stand-pivot;RW  Toilet  Transfer Details (indicate cue type and reason) to recliner, poor safety awareness attempting to sit prior to backed up to chair, unable to reach back for chair needing assist for eccentric control  Toileting- Clothing Manipulation and Hygiene Total assistance;Sit to/from stand;Sitting/lateral lean  Functional mobility during ADLs Moderate assistance;+2 for physical assistance;+2 for safety/equipment;Cueing for safety;Cueing for sequencing;Rolling walker  Bed Mobility  Overal bed mobility Needs Assistance  Bed Mobility Supine to Sit  Supine to sit Mod assist;+2 for physical assistance;+2 for safety/equipment  General bed mobility comments Increased time with assist to manage LEs over EOB, bring trunk to upright and to complete rotation to EOB sitting using bed pad  Transfers  Overall transfer level Needs assistance  Equipment used Rolling walker (2 wheeled)  Transfers Sit to/from [employer] Transfers  Sit to Stand Mod assist;+2 physical assistance;+2 safety/equipment;From elevated surface  Stand pivot transfers Mod assist;+2 physical assistance;+2 safety/equipment;From elevated surface  General transfer comment please see toilet transfer in ADL section  Balance  Overall balance assessment Needs assistance  Sitting-balance support Feet supported;Single extremity supported  Sitting balance-Leahy Scale Poor  Postural control Posterior lean  Standing balance support Bilateral upper extremity supported  Standing balance-Leahy Scale Poor  OT - End of Session  Equipment Utilized During Treatment Gait belt;Rolling walker  Activity Tolerance Patient tolerated treatment well  Patient left in chair;with call bell/phone within reach;with chair alarm set;with family/visitor  present  Nurse Communication Mobility status  OT Assessment  OT Recommendation/Assessment Patient needs continued OT Services  OT Visit Diagnosis Unsteadiness on feet (R26.81);Other abnormalities of gait and mobility (R26.89);History of falling (Z91.81);Muscle weakness (generalized) (M62.81)  OT Problem List Decreased strength;Decreased activity tolerance;Impaired balance (sitting and/or standing);Decreased safety awareness  OT Plan  OT Frequency (ACUTE ONLY) Min 2X/week  OT Treatment/Interventions (ACUTE ONLY) Self-care/ADL training;Balance training;Patient/family education;Therapeutic activities;Therapeutic exercise  AM-PAC OT "6 Clicks" Daily Activity Outcome Measure (Version 2)  Help from another person eating meals? 3  Help from another person taking care of personal grooming? 3  Help from another person toileting, which includes using toliet, bedpan, or urinal? 2  Help from another person bathing (including washing, rinsing, drying)? 2  Help from another person to put on and taking off regular upper body clothing? 3  Help from another person to put on and taking off regular lower body clothing? 1  6 Click Score 14  OT Recommendation  Follow Up Recommendations SNF  OT Equipment None recommended by OT  Individuals Consulted  Consulted and Agree with Results and Recommendations Family member/caregiver  Family Member Consulted daughter  Acute Rehab OT Goals  Patient Stated Goal to rehab  OT Goal Formulation With family  Time For Goal Achievement 08/21/20  Potential to Achieve Goals Good  OT Time Calculation  OT Start Time (ACUTE ONLY) 1120  OT Stop Time (ACUTE ONLY) 1143  OT Time Calculation (min) 23 min  OT General Charges  $OT Visit 1 Visit  OT Evaluation  $OT Eval Low Complexity 1 Low  Written Expression  Dominant Hand  (did not specify)   Delbert Phenix OT OT pager: [phone]

## 2020-08-07 NOTE — Progress Notes (Addendum)
Patient noted with confusion. Patient thinks he is home in [place]. Calling out "HELP" multiple times, not able to be redirected. Patient with foul language and attempts to hit at staff.    Patient with difficulty swallowing. Possible aspiration? Will update MD and day shift RN.  Call out to Covering doctor. Dr. Sharlet Salina with response and new order, see Mar.  Bed exit alarm maintained and rounds continued per unit protocol and MD order.

## 2020-08-07 NOTE — Evaluation (Signed)
Physical Therapy Evaluation Patient Details Name: Adam Owen MRN: [id number] DOB: [birth year] Today's Date: 08/07/2020   History of Present Illness  Patient is a 80 year old male with fall at home resulting in nondisplaced fracture of the right greater trochanter, nondisplaced fractures of the right medial and posterior acetabulum and mildly displaced fracture of the right inferior pubic ramus. Pt treated non-operatively. PMH includes previous falls, R hip replacement, hearing loss in left ear, coronary artery disease, constipation, osteoporosis, pulmonary fibrosis, anemia, cancer, memory loss  Clinical Impression  Pt admitted as above and presenting with functional mobility limitations 2* generalized weakness, post fx pain, balance deficits and premorbid deconditioning.  Pt would benefit from follow up rehab at SNF level to maximize IND and safety.    Follow Up Recommendations SNF    Equipment Recommendations  None recommended by PT    Recommendations for Other Services       Precautions / Restrictions Precautions Precautions: Fall Precaution Comments: HOH; better in R ear Restrictions Weight Bearing Restrictions: No RLE Weight Bearing: Weight bearing as tolerated      Mobility  Bed Mobility Overal bed mobility: Needs Assistance Bed Mobility: Supine to Sit     Supine to sit: Mod assist;+2 for physical assistance;+2 for safety/equipment     General bed mobility comments: Increased time with assist to manage LEs over EOB, bring trunk to upright and to complete rotation to EOB sitting using bed pad    Transfers Overall transfer level: Needs assistance Equipment used: Rolling walker (2 wheeled) Transfers: Sit to/from [employer] Sit to Stand: Min assist;Mod assist;+2 physical assistance;+2 safety/equipment;From elevated surface Stand pivot transfers: Min assist;Mod assist;+2 physical assistance;+2 safety/equipment       General transfer comment:  cues for transition position, LE management and use of UE to self assist  Ambulation/Gait Ambulation/Gait assistance: Min assist;Mod assist;+2 safety/equipment Gait Distance (Feet): 2 Feet Assistive device: Rolling walker (2 wheeled) Gait Pattern/deviations: Step-to pattern;Decreased step length - right;Decreased step length - left;Shuffle;Trunk flexed Gait velocity: decr   General Gait Details: cues for sequence, posture and position from [employer]            Wheelchair Mobility    Modified Rankin (Stroke Patients Only)       Balance Overall balance assessment: Needs assistance Sitting-balance support: Feet supported;Single extremity supported Sitting balance-Leahy Scale: Poor     Standing balance support: Bilateral upper extremity supported Standing balance-Leahy Scale: Poor                               Pertinent Vitals/Pain Pain Assessment: Faces Faces Pain Scale: Hurts little more Pain Location: R LE with mobility Pain Descriptors / Indicators: Grimacing;Aching Pain Intervention(s): Limited activity within patient's tolerance;Monitored during session;Premedicated before session    Home Living Family/patient expects to be discharged to:: Skilled nursing facility Living Arrangements: Children                    Prior Function Level of Independence: Needs assistance   Gait / Transfers Assistance Needed: per daughter patient had progressed to short household ambulation with rollator with supervision, using wheelchair for community distances.  ADL's / Homemaking Assistance Needed: DTR reports for past month patient needing assist with dressing, bathing, toileting. can feed himself.  Comments: per daughter patient living in [place] with spouse however patient and spouse having issues, patient lost 15-20 pounds and DTRs have been working on trying to  get patient stronger     Hand Dominance   Dominant Hand:  (did not specify)     Extremity/Trunk Assessment   Upper Extremity Assessment Upper Extremity Assessment: Generalized weakness    Lower Extremity Assessment Lower Extremity Assessment: Generalized weakness;RLE deficits/detail    Cervical / Trunk Assessment Cervical / Trunk Assessment: Kyphotic  Communication   Communication: HOH  Cognition Arousal/Alertness: Awake/alert Behavior During Therapy: WFL for tasks assessed/performed Overall Cognitive Status: History of cognitive impairments - at baseline                                 General Comments: patient appropriate with therapy, poor safety awareness trying to sit into chair before backed up completely      General Comments      Exercises     Assessment/Plan    PT Assessment Patient needs continued PT services  PT Problem List Decreased strength;Decreased range of motion;Decreased activity tolerance;Decreased balance;Decreased mobility;Decreased cognition;Decreased knowledge of use of DME;Pain;Decreased safety awareness       PT Treatment Interventions DME instruction;Gait training;Stair training;Functional mobility training;Therapeutic activities;Therapeutic exercise;Cognitive remediation;Balance training;Patient/family education    PT Goals (Current goals can be found in the Care Plan section)  Acute Rehab PT Goals Patient Stated Goal: No specific goals expressed PT Goal Formulation: With patient/family Time For Goal Achievement: 08/21/20 Potential to Achieve Goals: Fair    Frequency Min 3X/week   Barriers to discharge        Co-evaluation PT/OT/SLP Co-Evaluation/Treatment: Yes Reason for Co-Treatment: For patient/therapist safety PT goals addressed during session: Mobility/safety with mobility OT goals addressed during session: ADL's and self-care       AM-PAC PT "6 Clicks" Mobility  Outcome Measure Help needed turning from your back to your side while in a flat bed without using bedrails?: A Lot Help needed  moving from lying on your back to sitting on the side of a flat bed without using bedrails?: A Lot Help needed moving to and from a bed to a chair (including a wheelchair)?: A Lot Help needed standing up from a chair using your arms (e.g., wheelchair or bedside chair)?: A Lot Help needed to walk in hospital room?: A Lot Help needed climbing 3-5 steps with a railing? : Total 6 Click Score: 11    End of Session Equipment Utilized During Treatment: Gait belt Activity Tolerance: Patient tolerated treatment well Patient left: in chair;with call bell/phone within reach;with chair alarm set;with family/visitor present Nurse Communication: Mobility status PT Visit Diagnosis: Unsteadiness on feet (R26.81);Muscle weakness (generalized) (M62.81);History of falling (Z91.81);Difficulty in walking, not elsewhere classified (R26.2);Pain Pain - Right/Left: Right Pain - part of body: Hip    Time: 3570-1779 PT Time Calculation (min) (ACUTE ONLY): 23 min   Charges:   PT Evaluation $PT Eval Low Complexity: 1 Low          [place] Pager [phone] Office [phone]   [place] Marney 08/07/2020, 12:43 PM

## 2020-08-07 NOTE — Plan of Care (Signed)
  Problem: Education: Goal: Knowledge of General Education information will improve Description: Including pain rating scale, medication(s)/side effects and non-pharmacologic comfort measures Outcome: Progressing   Problem: Health Behavior/Discharge Planning: Goal: Ability to manage health-related needs will improve Outcome: Progressing   Problem: Clinical Measurements: Goal: Ability to maintain clinical measurements within normal limits will improve Outcome: Progressing Goal: Will remain free from infection Outcome: Progressing Goal: Diagnostic test results will improve Outcome: Progressing Goal: Respiratory complications will improve Outcome: Progressing Goal: Cardiovascular complication will be avoided Outcome: Progressing   Problem: Activity: Goal: Risk for activity intolerance will decrease Outcome: Progressing   Problem: Nutrition: Goal: Adequate nutrition will be maintained Outcome: Progressing   Problem: Coping: Goal: Level of anxiety will decrease Outcome: Progressing   Problem: Elimination: Goal: Will not experience complications related to bowel motility Outcome: Progressing Goal: Will not experience complications related to urinary retention Outcome: Progressing   Problem: Pain Managment: Goal: General experience of comfort will improve Outcome: Progressing   Problem: Skin Integrity: Goal: Risk for impaired skin integrity will decrease Outcome: Progressing   Problem: Education: Goal: Verbalization of understanding the information provided (i.e., activity precautions, restrictions, etc) will improve Outcome: Progressing Goal: Individualized Educational Video(s) Outcome: Progressing   Problem: Activity: Goal: Ability to ambulate and perform ADLs will improve Outcome: Progressing   Problem: Clinical Measurements: Goal: Postoperative complications will be avoided or minimized Outcome: Progressing   Problem: Self-Concept: Goal: Ability to maintain  and perform role responsibilities to the fullest extent possible will improve Outcome: Progressing   Problem: Pain Management: Goal: Pain level will decrease Outcome: Progressing

## 2020-08-07 NOTE — Evaluation (Signed)
Clinical/Bedside Swallow Evaluation Patient Details  Name: Adam Owen MRN: [id number] Date of Birth: 12-11-1940  Today's Date: 08/07/2020 Time: SLP Start Time (ACUTE ONLY): 42 SLP Stop Time (ACUTE ONLY): 0071 SLP Time Calculation (min) (ACUTE ONLY): 29 min  Past Medical History:  Past Medical History:  Diagnosis Date   Anemia    Cancer ([place])    Constipation    Coronary artery disease    Edema    Gait abnormality    GERD (gastroesophageal reflux disease)    Gout    High cholesterol    Hypertension    Leg weakness    Memory loss    Myocardial infarction ([place])    Obstructive sleep apnea    Osteoporosis    Premature heartbeats    Pulmonary fibrosis ([place])    Spinal stenosis of lumbar region    UTI (urinary tract infection)    Vertigo    Vitamin D deficiency    Past Surgical History:  Past Surgical History:  Procedure Laterality Date   CORONARY ARTERY BYPASS GRAFT     JOINT REPLACEMENT Bilateral    HPI:  Patient is a 80 yo male adm to [facility] after fall at his daughter's home.  Found to have hip fx but no surgical intervention warranted. 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 Pt reportedly has occasional nasal regurgitation and coughing with po intake.  He saw ENT with plans for swallow study but this was not completed as patient now admitted to hospital after a fall. Per ENT's note, pt has acquired velopharyngeal incompetence and no recurrence of lesions.  Weight loss reported prior to pt moving to [place] with his daughter.  He appears to be frail and deconditioned.   Assessment / Plan / Recommendation Clinical Impression  Regarding swallowing, pt has h/o lingual cancer s/p surgery (no radiation,chemo per daughter, [name]), cleft lip as a child, and bifurcated shortened soft palate that may contribute to dysphagia.   Pt  reportedly has some nasal regurgitation of liquids/foods and coughing with intake prior to admission.      Pt sleepy but participative today - He had received pain medicine before PT/OT.  But he demonstrates strong voice when appearing frustrated thus suspect vagus nerve intact.   Decreased palatal closure bilaterally (potentially structural) observed with mild asymmetry of lingual musculature.   With consumption of water via straw, pt noted to have consistent subtle throat clearing with multiple swallows per bolus - concerning for potential pharyngeal retention and laryngeal penetration/aspiration.      Pt sleepy during session and at times minimally raise voice when answering questions - suspect due to frustration, therefore minimal po offered.  Daughter, [name], states pt is "grumpy".        Pt admits to coughing up food and drinks at times prior to admit.  Advised him and his daughter, [name], that MBS is indicated to allow structures and muscular contraction,etc to be viewed to determine optimal treatment plan.      Recommended pt consume liquids via tsp for today, medications crushed with Ensure, etc and complete MBS next date (*hopefully with improved mentation).   If pt is coughing with intake - he must cough to clear and stop po.       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  Now pt with increased  potential pulmonary ramifications with aspiration at this time due to hip fx, mentation, decreased mobility, condition of dentition, cachexia and deconditioning.  Note pt's full code status at this time.        MBS will be conducted to allow instrumental evaluation, determine if aspiration is occurring, pt's ability to clear, potential compensation strategies and diet modifications to mitigate aspiration risk. Advised [name] that aspiration in this pt will likely not be fully prevented it at this time and  mitigation is functional goal.    Daughter,  [name], advised understanding to information at this time a was provided with written instructions.   SLP Visit Diagnosis: Dysphagia, pharyngoesophageal phase (R13.14);Dysphagia, unspecified (R13.10);Dysphagia, oropharyngeal phase (R13.12)    Aspiration Risk  Moderate aspiration risk;Risk for inadequate nutrition/hydration    Diet Recommendation Thin liquid (tsps thin liquids)   Medication Administration: Crushed with puree (crush with Ensure if able) Compensations: Slow rate;Small sips/bites;Other (Comment) (cue pt to cough and expectorate if coughing with intake)    Other  Recommendations Oral Care Recommendations: Oral care QID   Follow up Recommendations Skilled Nursing facility      Frequency and Duration min 2x/week  1 week       Prognosis Prognosis for Safe Diet Advancement: Fair Barriers to Reach Goals: Time post onset;Behavior      Swallow Study   General Date of Onset: 08/07/20 HPI: Patient is a 80 yo male adm to [facility] after fall at his daughter's home.  Found to have hip fx but no surgical intervention warranted. 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 Pt reportedly has occasional nasal regurgitation and coughing with po intake.  He saw ENT with plans for swallow study but this was not completed as patient now admitted to hospital after a fall. Per ENT's note, pt has acquired velopharyngeal incompetence and no recurrence of lesions.  Weight loss reported prior to pt moving to [place] with his daughter.  He appears to be frail and deconditioned. Type of Study: Bedside Swallow Evaluation Diet Prior to this Study: Regular;Thin liquids Temperature Spikes Noted: No Respiratory Status: Room air History of Recent Intubation: No Behavior/Cognition: Cooperative;Lethargic/Drowsy;Requires  cueing Oral Cavity Assessment: Dry Oral Care Completed by SLP: Recent completion by staff Oral Cavity - Dentition: Missing dentition;Other (Comment) (some missing and some in poor condition) Vision: Functional for self-feeding Self-Feeding Abilities: Needs assist Patient Positioning: Upright in chair Baseline Vocal Quality: Normal Volitional Swallow:  (DNT)    Oral/Motor/Sensory Function Overall Oral Motor/Sensory Function: Other (comment) (bifurcated shortened uvula, posterior right lingual asymmetry)   Ice Chips Ice chips: Not tested   Thin Liquid Thin Liquid: Impaired Presentation: Straw Pharyngeal  Phase Impairments: Multiple swallows;Throat Clearing - Immediate;Throat Clearing - Delayed    Nectar Thick Nectar Thick Liquid: Not tested   Honey Thick Honey Thick Liquid: Not tested   Puree Puree: Not tested   Solid     Solid: Not tested      Macario Golds 08/07/2020,4:15 PM  Kathleen Lime, MS [place] Office [phone] Pager [phone]

## 2020-08-07 NOTE — Plan of Care (Signed)
  Problem: Education: Goal: Knowledge of General Education information will improve Description: Including pain rating scale, medication(s)/side effects and non-pharmacologic comfort measures Outcome: Progressing   Problem: Pain Managment: Goal: General experience of comfort will improve Outcome: Progressing   Problem: Skin Integrity: Goal: Risk for impaired skin integrity will decrease Outcome: Progressing   

## 2020-08-07 NOTE — NC FL2 (Signed)
[place] LEVEL OF CARE SCREENING TOOL     IDENTIFICATION  Patient Name: Adam Owen Birthdate: 1940-10-15 Sex: male Admission Date (Current Location): 08/06/2020  [facility] [place] Number:  Herbalist and Address:  [facility],  [address] [place], [place]      Provider Number: [id number]  Attending Physician Name and Address:  Kerney Elbe, DO  Relative Name and Phone Number:  Broadus John (daughter) Ph: [phone]    Current Level of Care: Hospital Recommended Level of Care: [place] Prior Approval Number:    Date Approved/Denied:   PASRR Number: [id number]  Discharge Plan: SNF    Current Diagnoses: Patient Active Problem List   Diagnosis Date Noted   Closed right hip fracture ([place]) 08/06/2020    Orientation RESPIRATION BLADDER Height & Weight     Self, Place  Normal Incontinent Weight: 116 lb 13.9 oz (53 kg) Height:  5\' 9"  (175.3 cm)  BEHAVIORAL SYMPTOMS/MOOD NEUROLOGICAL BOWEL NUTRITION STATUS      Incontinent Diet (Heart healthy)  AMBULATORY STATUS COMMUNICATION OF NEEDS Skin   Extensive Assist Verbally Other (Comment) (Ecchymosis: bilateral arms)                       Personal Care Assistance Level of Assistance  Bathing, Feeding, Dressing Bathing Assistance: Limited assistance Feeding assistance: Independent Dressing Assistance: Limited assistance     Functional Limitations Info  Sight, Hearing, Speech Sight Info: Impaired Hearing Info: Impaired Speech Info: Adequate    SPECIAL CARE FACTORS FREQUENCY  PT (By licensed PT), OT (By licensed OT)     PT Frequency: 5x's/week OT Frequency: 5x's/week            Contractures Contractures Info: Not present    Additional Factors Info  Code Status, Allergies, Psychotropic Code Status Info: Full Allergies Info: Iodine Psychotropic Info: Haldol         Current Medications (08/07/2020):  This is the current  hospital active medication list Current Facility-Administered Medications  Medication Dose Route Frequency Provider Last Rate Last Admin   0.9 %  sodium chloride infusion   Intravenous Continuous Darliss Cheney, MD 75 mL/hr at 08/07/20 0430 Infusion Verify at 08/07/20 0430   acetaminophen (TYLENOL) tablet 500 mg  500 mg Oral Q6H PRN Darliss Cheney, MD       aspirin EC tablet 81 mg  81 mg Oral Daily Pahwani, Einar Grad, MD   81 mg at 08/07/20 1042   atorvastatin (LIPITOR) tablet 40 mg  40 mg Oral QPM Pahwani, Einar Grad, MD   40 mg at 08/06/20 1735   enoxaparin (LOVENOX) injection 40 mg  40 mg Subcutaneous Q24H Pahwani, Einar Grad, MD   40 mg at 08/06/20 1625   famotidine (PEPCID) tablet 20 mg  20 mg Oral BID Darliss Cheney, MD   20 mg at 08/07/20 1042   [START ON 08/08/2020] feeding supplement (ENSURE ENLIVE / ENSURE PLUS) liquid 237 mL  237 mL Oral BID BM Sheikh, Omair Latif, DO       HYDROcodone-acetaminophen (NORCO/VICODIN) 5-325 MG per tablet 1-2 tablet  1-2 tablet Oral Q4H PRN Darliss Cheney, MD   1 tablet at 08/07/20 1042   lactose free nutrition (BOOST PLUS) liquid 237 mL  237 mL Oral Q24H Sheikh, Omair Latif, DO       metoprolol tartrate (LOPRESSOR) tablet 25 mg  25 mg Oral BID Darliss Cheney, MD   25 mg at 08/07/20 1042   ondansetron (ZOFRAN) tablet  4 mg  4 mg Oral Q6H PRN Darliss Cheney, MD       Or   ondansetron (ZOFRAN) injection 4 mg  4 mg Intravenous Q6H PRN Darliss Cheney, MD       tamsulosin (FLOMAX) capsule 0.8 mg  0.8 mg Oral QPM Darliss Cheney, MD   0.8 mg at 08/06/20 1735     Discharge Medications: Please see discharge summary for a list of discharge medications.  Relevant Imaging Results:  Relevant Lab Results:   Additional Information SSN: [id number]  Sherie Don, LCSW

## 2020-08-07 NOTE — Progress Notes (Signed)
PROGRESS NOTE    Adam Owen  [id number] DOB: 07-04-1940 DOA: 08/06/2020 PCP: Pcp, No   Brief Narrative:  The patient is a 80 year old Caucasian male with a past medical history significant for but not limited to CAD, pulmonary fibrosis, history of memory loss, GERD, gout, hypertension, hyperlipidemia as well as other multiple comorbidities who was brought into the ED by his daughter after a fall.  History is mostly obtained from the daughter over the phone and the ED physician and reportedly patient has been having more frequent falls and due to his social situations has been living with his daughter.  He had an unwitnessed fall yesterday where he landed on his right hip and was unable to get up or bear weight.  Upon arrival to the ED he was hemodynamically stable and initial x-rays were done showed that the right greater trochanter.  Prosthetic fracture was undisplaced right acetabular fracture.  Orthopedic surgery evaluated and opinion that this will be managed nonsurgically and recommended admission to the hospital service.  Assessment & Plan:   Active Problems:   Closed right hip fracture (HCC)  Acute nondisplaced fracture of the right greater trochanter/fracture of the right medial and posterior acetabulum/moderate displaced fracture of the right inferior pubic rami -DG Pelvis and Femur showed "Changes consistent with right greater trochanter periprosthetic fracture.   Lucency in the medial aspect of the right acetabulum suspicious for undisplaced fracture" and "Findings suggestive of right greater trochanter periprosthetic fracture. Findings consistent with undisplaced right  acetabular fracture. CT may be helpful for further evaluation." -CT Hip Right w/o Contrast showed "Acute nondisplaced fracture of the right greater trochanter. Acute nondisplaced fractures of the right medial and posterior acetabulum. Acute mildly displaced fracture of the  right inferior pubic ramus. Prior  right total hip arthroplasty without evidence of hardware complication." -Orthopedic Sugery has been consulted and recommending nonsurgical management -Initially had him nonweightbearing but now they have recommended weightbearing as tolerated and will monitor serial x-rays -They are recommending PT OT evaluation and disposition pending the PT evaluation and they are recommending SNF -Continue with pain control with hydrocodone-acetaminophen 1-2 tabs every 4 hours as needed for moderate pain -C/w With NS 75 mL/hr  -Patient is VTE prophylaxis with enoxaparin 40 mg subcu every 24  Dysphagia -SLP consulted -Patient to undergo MBS in the AM   Hyperlipidemia -C/w Atorvastain 40 mg po qEvening   Hypertension -C/w Metoprolol Tartrate 25 mg po BID -Continue to Monitor BP per protocol -Last BP reading was   BPH -C/w Tamsulosin 0.8 mg po Daily   Macrocytic Anemia -Patient's MCV was 108.5 and his hemoglobin/hematocrit went from 8.9/27.6 -> 8.2/25.7 is now trended down to 7.7/24.2 -Check anemia panel in a.m. -Continue to monitor for signs and symptoms of bleeding; currently no overt bleeding noted next-repeat CBC in a.m.  Thrombocytopenia -Patient's platelet count went from 153 -> 138 is now 126 -continue monitor for signs and symptoms of bleeding -Repeat CBC in a.m.  Leukocytosis -Likely reactive in the setting of his fall -Patient's WBC is trended down a lot from 10.7 -> 9.1 -> 10.1 -Continue to Monitor and Trend   Severe protein calorie malnutrition in the context of Chronic illness/ Underweight -Nutritionist was consulted for further evaluation and assistance -Estimated body mass index is 17.26 kg/m as calculated from the following:   Height as of this encounter: 5\' 9"  (1.753 m).   Weight as of this encounter: 53 kg. -They are recommending Ensure Enlive p.o. twice daily, boost plus chocolate,  vital accusing daily and if his swallowing is unable to improve they are recommending  placing a feeding tube for nutritional support -The speech therapist is recommending patient consuming liquids via teaspoon for the day and medications crushed with Ensure and they are completing an MBS in the next day   DVT prophylaxis: Enoxaparin 40 mg sq q24h Code Status: FULL CODE  Family Communication: Discussed with the daughters at bedside Disposition Plan: Pending further clinical improvement. Likley to go to SNF  Status is: Inpatient  Remains inpatient appropriate because:Unsafe d/c plan, IV treatments appropriate due to intensity of illness or inability to take PO, and Inpatient level of care appropriate due to severity of illness  Dispo: The patient is from: Home              Anticipated d/c is to: SNF              Patient currently is not medically stable to d/c.   Difficult to place patient No  Consultants:  Orthopedic Surgery   Procedures: None   Antimicrobials:  Anti-infectives (From admission, onward)    None        Subjective: Seen and examined at bedside and states that he has pain whenever he bears weight on his leg.  No nausea or vomiting.  Denies any lightheadedness or dizziness.  Daughter was concerned about his swallowing so SLP has been consulted.  He feels okay otherwise.  No other concerns or complaints at this time but was confused overnight.  Objective: Vitals:   08/07/20 0511 08/07/20 0600 08/07/20 1450 08/07/20 1455  BP: (!) 121/44  (!) 109/43 (!) 101/36  Pulse: (!) 54  (!) 46 (!) 52  Resp: 16  16   Temp: 98.3 F (36.8 C)  98 F (36.7 C)   TempSrc: Oral  Oral   SpO2: 97%  95% 95%  Weight:  53 kg    Height:        Intake/Output Summary (Last 24 hours) at 08/07/2020 1729 Last data filed at 08/07/2020 1400 Gross per 24 hour  Intake 1073.03 ml  Output 1050 ml  Net 23.03 ml   Filed Weights   08/06/20 0807 08/07/20 0600  Weight: 52 kg 53 kg   Examination: Physical Exam:  Constitutional: Thin cachectic chronically ill-appearing  Caucasian male currently in no acute distress appears calm but slightly uncomfortable  Eyes: Lids and conjunctivae normal, sclerae anicteric  ENMT: External Ears, Nose appear normal. Grossly normal hearing.  Neck: Appears normal, supple, no cervical masses, normal ROM, no appreciable thyromegaly; no JVD Respiratory: Diminished to auscultation bilaterally, no wheezing, rales, rhonchi or crackles. Normal respiratory effort and patient is not tachypenic. No accessory muscle use.  Unlabored breathing Cardiovascular: RRR, no murmurs / rubs / gallops. S1 and S2 auscultated.  No appreciable extremity edema Abdomen: Soft, non-tender, non-distended. Bowel sounds positive.  GU: Deferred. Musculoskeletal: No clubbing / cyanosis of digits/nails. Skin: No rashes, lesions, ulcers on limited skin evaluation. No induration; Warm and dry.  Neurologic: CN 2-12 grossly intact with no focal deficits. Romberg sign and cerebellar reflexes not assessed.  Psychiatric: Slightly impaired judgment and insight. Alert and oriented x 3. Normal mood and appropriate affect.   Data Reviewed: I have personally reviewed following labs and imaging studies  CBC: Recent Labs  Lab 08/06/20 0825 08/06/20 1347 08/07/20 0328  WBC 10.7* 9.1 10.1  NEUTROABS 9.2*  --   --   HGB 8.9* 8.2* 7.7*  HCT 27.6* 25.7* 24.2*  MCV 106.6* 108.9*  108.5*  PLT 153 138* 237*   Basic Metabolic Panel: Recent Labs  Lab 08/06/20 0825 08/06/20 1347 08/07/20 0328  NA 139  --  140  K 4.1  --  4.2  CL 103  --  107  CO2 31  --  30  GLUCOSE 112*  --  101*  BUN 16  --  15  CREATININE 0.84 0.89 0.85  CALCIUM 9.7  --  8.7*  MG  --  1.9  --    GFR: Estimated Creatinine Clearance: 52.8 mL/min (by C-G formula based on SCr of 0.85 mg/dL). Liver Function Tests: No results for input(s): AST, ALT, ALKPHOS, BILITOT, PROT, ALBUMIN in the last 168 hours. No results for input(s): LIPASE, AMYLASE in the last 168 hours. No results for input(s): AMMONIA  in the last 168 hours. Coagulation Profile: No results for input(s): INR, PROTIME in the last 168 hours. Cardiac Enzymes: No results for input(s): CKTOTAL, CKMB, CKMBINDEX, TROPONINI in the last 168 hours. BNP (last 3 results) No results for input(s): PROBNP in the last 8760 hours. HbA1C: No results for input(s): HGBA1C in the last 72 hours. CBG: No results for input(s): GLUCAP in the last 168 hours. Lipid Profile: No results for input(s): CHOL, HDL, LDLCALC, TRIG, CHOLHDL, LDLDIRECT in the last 72 hours. Thyroid Function Tests: No results for input(s): TSH, T4TOTAL, FREET4, T3FREE, THYROIDAB in the last 72 hours. Anemia Panel: Recent Labs    08/06/20 1347  VITAMINB12 408  FOLATE 19.2   Sepsis Labs: No results for input(s): PROCALCITON, LATICACIDVEN in the last 168 hours.  Recent Results (from the past 240 hour(s))  Resp Panel by RT-PCR (Flu A&B, Covid) Nasopharyngeal Swab     Status: None   Collection Time: 08/06/20  8:26 AM   Specimen: Nasopharyngeal Swab; Nasopharyngeal(NP) swabs in vial transport medium  Result Value Ref Range Status   SARS Coronavirus 2 by RT PCR NEGATIVE NEGATIVE Final    Comment: (NOTE) SARS-CoV-2 target nucleic acids are NOT DETECTED.  The SARS-CoV-2 RNA is generally detectable in upper respiratory specimens during the acute phase of infection. The lowest concentration of SARS-CoV-2 viral copies this assay can detect is 138 copies/mL. A negative result does not preclude SARS-Cov-2 infection and should not be used as the sole basis for treatment or other patient management decisions. A negative result may occur with  improper specimen collection/handling, submission of specimen other than nasopharyngeal swab, presence of viral mutation(s) within the areas targeted by this assay, and inadequate number of viral copies(<138 copies/mL). A negative result must be combined with clinical observations, patient history, and epidemiological information. The  expected result is Negative.  Fact Sheet for Patients:  EntrepreneurPulse.com.au  Fact Sheet for Healthcare Providers:  IncredibleEmployment.be  This test is no t yet approved or cleared by the [place] FDA and  has been authorized for detection and/or diagnosis of SARS-CoV-2 by FDA under an Emergency Use Authorization (EUA). This EUA will remain  in effect (meaning this test can be used) for the duration of the COVID-19 declaration under Section 564(b)(1) of the Act, 21 U.S.C.section 360bbb-3(b)(1), unless the authorization is terminated  or revoked sooner.       Influenza A by PCR NEGATIVE NEGATIVE Final   Influenza B by PCR NEGATIVE NEGATIVE Final    Comment: (NOTE) The Xpert Xpress SARS-CoV-2/FLU/RSV plus assay is intended as an aid in the diagnosis of influenza from Nasopharyngeal swab specimens and should not be used as a sole basis for treatment. Nasal washings and aspirates  are unacceptable for Xpert Xpress SARS-CoV-2/FLU/RSV testing.  Fact Sheet for Patients: EntrepreneurPulse.com.au  Fact Sheet for Healthcare Providers: IncredibleEmployment.be  This test is not yet approved or cleared by the [place] FDA and has been authorized for detection and/or diagnosis of SARS-CoV-2 by FDA under an Emergency Use Authorization (EUA). This EUA will remain in effect (meaning this test can be used) for the duration of the COVID-19 declaration under Section 564(b)(1) of the Act, 21 U.S.C. section 360bbb-3(b)(1), unless the authorization is terminated or revoked.  Performed at [facility], [address] [place], [place] [address]     RN Pressure Injury Documentation:     Estimated body mass index is 17.26 kg/m as calculated from the following:   Height as of this encounter: 5\' 9"  (1.753 m).   Weight as of this encounter: 53 kg.  Malnutrition Type:  Nutrition Problem:  Severe Malnutrition Etiology: dysphagia, chronic illness  Malnutrition Characteristics:  Signs/Symptoms: energy intake < or equal to 75% for > or equal to 1 month, severe fat depletion  Nutrition Interventions:  Interventions: Ensure Enlive (each supplement provides 350kcal and 20 grams of protein), Hormel Shake, MVI, Magic cup, Boost Plus   Radiology Studies: DG Pelvis 1-2 Views  Result Date: 08/06/2020 CLINICAL DATA:  Recent fall with right hip pain, initial encounter EXAM: PELVIS - 1-2 VIEW COMPARISON:  None. FINDINGS: Bilateral hip replacements are noted. No dislocation is noted. Lucency is seen in the greater trochanter suspicious for periprosthetic fracture. Multiple healed fractures are noted within the pelvis. Lucency is seen within the right acetabulum medially suspicious for acute nondisplaced fracture. CT of the pelvis may be helpful for further evaluation. IMPRESSION: Changes consistent with right greater trochanter periprosthetic fracture. Lucency in the medial aspect of the right acetabulum suspicious for undisplaced fracture. Electronically Signed   By: Inez Catalina M.D.   On: 08/06/2020 08:59   CT Hip Right Wo Contrast  Result Date: 08/06/2020 CLINICAL DATA:  Right hip pain since fall yesterday. EXAM: CT OF THE RIGHT HIP WITHOUT CONTRAST TECHNIQUE: Multidetector CT imaging of the right hip was performed according to the standard protocol. Multiplanar CT image reconstructions were also generated. COMPARISON:  Pelvis and right femur x-rays from same day. FINDINGS: Bones/Joint/Cartilage Prior right total hip arthroplasty. No evidence of hardware failure or loosening. Acute nondisplaced fracture of the right greater trochanter (series 6, image 22). Acute nondisplaced fractures of the right medial and posterior acetabulum (series 6, images 48 and 29). Acute mildly displaced fracture of the right inferior pubic ramus (series 3, image 67). Old healed fractures of the left superior and  inferior pubic rami. No joint effusion. Ligaments Ligaments are suboptimally evaluated by CT. Muscles and Tendons Grossly intact.  Right gluteus muscle atrophy. Soft tissue Small hematoma overlying the greater trochanter. No soft tissue mass. IMPRESSION: 1. Acute nondisplaced fracture of the right greater trochanter. 2. Acute nondisplaced fractures of the right medial and posterior acetabulum. 3. Acute mildly displaced fracture of the right inferior pubic ramus. 4. Prior right total hip arthroplasty without evidence of hardware complication. Electronically Signed   By: Titus Dubin M.D.   On: 08/06/2020 10:24   DG Femur Min 2 Views Right  Result Date: 08/06/2020 CLINICAL DATA:  Recent fall with right leg pain, initial encounter EXAM: RIGHT FEMUR 2 VIEWS COMPARISON:  None. FINDINGS: Right hip replacement is noted. No dislocation is seen. There is lucency identified in the region of the greater trochanter suspicious for underlying periprosthetic fracture. Additionally lucency is seen  within the acetabulum on the right consistent with an undisplaced fracture. Distal femur appears within normal limits. Degenerative changes in the knee joint are seen. Multiple old healed fractures are noted within the pelvis. IMPRESSION: Findings suggestive of right greater trochanter periprosthetic fracture. Findings consistent with undisplaced right acetabular fracture. CT may be helpful for further evaluation. Electronically Signed   By: Inez Catalina M.D.   On: 08/06/2020 08:57    Scheduled Meds:  aspirin EC  81 mg Oral Daily   atorvastatin  40 mg Oral QPM   enoxaparin (LOVENOX) injection  40 mg Subcutaneous Q24H   famotidine  20 mg Oral BID   [START ON 08/08/2020] feeding supplement  237 mL Oral BID BM   lactose free nutrition  237 mL Oral Q24H   metoprolol tartrate  25 mg Oral BID   tamsulosin  0.8 mg Oral QPM   Continuous Infusions:  sodium chloride 75 mL/hr at 08/07/20 0430    LOS: 1 day   Kerney Elbe, DO [facility] PAGER is on [place]  If 7PM-7AM, please contact night-coverage [email or web address]

## 2020-08-07 NOTE — Progress Notes (Addendum)
Clinical swallow evaluation completed, full report to follow.   Pt sitting upright in chair - although shifted due to his discomfort on his bottom - SLP had helped reposition him for comfort with RN.    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   Pt reportedly has some nasal regurgitation of liquids/foods and coughing with intake prior to admission.    Pt sleepy but participative today - He had received pain medicine before PT/OT.  But he demonstrates strong voice when appearing frustrated thus suspect vagus nerve intact.   Decreased palatal closure bilaterally (potentially structural) observed with mild asymmetry of lingual musculature.   With consumption of water via straw, pt noted to have consistent subtle throat clearing with multiple swallows per bolus - concerning for potential pharyngeal retention and laryngeal penetration/aspiration.    Pt sleepy during session and at times minimally raise voice when answering questions - suspect due to frustration, therefore minimal po offered.  Daughter, Adam Owen, states pt is "grumpy".    Pt admits to coughing up food and drinks at times prior to admit.  Advised him and his daughter, Adam Owen, that MBS is indicated to allow structures and muscular contraction,etc to be viewed to determine optimal treatment plan.  Recommended pt consume liquids via tsp for today, medications crushed with Ensure, etc and complete MBS next date (*hopefully with improved mentation).   If pt coughing - he must cough to clear and stop po.   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  Now pt with increased potential pulmonary ramifications with aspiration at this time due to hip fx, mentation, decreased mobility, condition of dentition, cachexia  and deconditioning.  Note pt's full code status at this time.    MBS will be conducted to allow instrumental evaluation, determine if aspiration is occurring, pt's ability to clear, potential compensation strategies and diet modifications to mitigate aspiration risk. Advised Adam Owen that aspiration in this pt will likely not be fully prevented it at this time and mitigation is functional goal.  Daughter,  Adam Owen, advised understanding to information at this time a was provided with written instructions.      Adam Lime, MS [facility] SLP Acute Rehab Services Office [phone] Pager [phone]

## 2020-08-07 NOTE — Progress Notes (Signed)
Subjective:   Patient is alert, when woken. Mildly confused, asking if his legs are going to fall off the bed when he is firmly in the middle of the bed. Denies hip pain this morning. Denies chest pain, SOB, Calf pain. No nausea/vomiting. No other complaints.    Objective:  PE: VITALS:   Vitals:   08/06/20 1317 08/06/20 2214 08/07/20 0511 08/07/20 0600  BP: 120/65 136/79 (!) 121/44   Pulse: 62 62 (!) 54   Resp: 16 18 16    Temp: 97.6 F (36.4 C) 98 F (36.7 C) 98.3 F (36.8 C)   TempSrc: Oral Oral Oral   SpO2: 98% 94% 97%   Weight:    53 kg  Height:       MSK: Sensation intact distally Intact pulses distally Dorsiflexion/Plantar flexion intact No TTP to right greater trochanter  LABS  Results for orders placed or performed during the hospital encounter of 08/06/20 (from the past 24 hour(s))  CBC with Differential     Status: Abnormal   Collection Time: 08/06/20  8:25 AM  Result Value Ref Range   WBC 10.7 (H) 4.0 - 10.5 K/uL   RBC 2.59 (L) 4.22 - 5.81 MIL/uL   Hemoglobin 8.9 (L) 13.0 - 17.0 g/dL   HCT 27.6 (L) 39.0 - 52.0 %   MCV 106.6 (H) 80.0 - 100.0 fL   MCH 34.4 (H) 26.0 - 34.0 pg   MCHC 32.2 30.0 - 36.0 g/dL   RDW 19.5 (H) 11.5 - 15.5 %   Platelets 153 150 - 400 K/uL   nRBC 0.0 0.0 - 0.2 %   Neutrophils Relative % 85 %   Neutro Abs 9.2 (H) 1.7 - 7.7 K/uL   Lymphocytes Relative 5 %   Lymphs Abs 0.6 (L) 0.7 - 4.0 K/uL   Monocytes Relative 8 %   Monocytes Absolute 0.8 0.1 - 1.0 K/uL   Eosinophils Relative 1 %   Eosinophils Absolute 0.1 0.0 - 0.5 K/uL   Basophils Relative 0 %   Basophils Absolute 0.0 0.0 - 0.1 K/uL   Immature Granulocytes 1 %   Abs Immature Granulocytes 0.05 0.00 - 0.07 K/uL  Basic metabolic panel     Status: Abnormal   Collection Time: 08/06/20  8:25 AM  Result Value Ref Range   Sodium 139 135 - 145 mmol/L   Potassium 4.1 3.5 - 5.1 mmol/L   Chloride 103 98 - 111 mmol/L   CO2 31 22 - 32 mmol/L   Glucose, Bld 112 (H) 70 - 99 mg/dL    BUN 16 8 - 23 mg/dL   Creatinine, Ser 0.84 0.61 - 1.24 mg/dL   Calcium 9.7 8.9 - 10.3 mg/dL   GFR, Estimated >60 >60 mL/min   Anion gap 5 5 - 15  Resp Panel by RT-PCR (Flu A&B, Covid) Nasopharyngeal Swab     Status: None   Collection Time: 08/06/20  8:26 AM   Specimen: Nasopharyngeal Swab; Nasopharyngeal(NP) swabs in vial transport medium  Result Value Ref Range   SARS Coronavirus 2 by RT PCR NEGATIVE NEGATIVE   Influenza A by PCR NEGATIVE NEGATIVE   Influenza B by PCR NEGATIVE NEGATIVE  Urinalysis, Routine w reflex microscopic Urine, Clean Catch     Status: Abnormal   Collection Time: 08/06/20  8:39 AM  Result Value Ref Range   Color, Urine YELLOW YELLOW   APPearance CLEAR CLEAR   Specific Gravity, Urine 1.013 1.005 - 1.030   pH 6.0 5.0 - 8.0  Glucose, UA NEGATIVE NEGATIVE mg/dL   Hgb urine dipstick NEGATIVE NEGATIVE   Bilirubin Urine NEGATIVE NEGATIVE   Ketones, ur NEGATIVE NEGATIVE mg/dL   Protein, ur NEGATIVE NEGATIVE mg/dL   Nitrite NEGATIVE NEGATIVE   Leukocytes,Ua MODERATE (A) NEGATIVE   RBC / HPF 0-5 0 - 5 RBC/hpf   WBC, UA 6-10 0 - 5 WBC/hpf   Squamous Epithelial / LPF 0-5 0 - 5  CBC     Status: Abnormal   Collection Time: 08/06/20  1:47 PM  Result Value Ref Range   WBC 9.1 4.0 - 10.5 K/uL   RBC 2.36 (L) 4.22 - 5.81 MIL/uL   Hemoglobin 8.2 (L) 13.0 - 17.0 g/dL   HCT 25.7 (L) 39.0 - 52.0 %   MCV 108.9 (H) 80.0 - 100.0 fL   MCH 34.7 (H) 26.0 - 34.0 pg   MCHC 31.9 30.0 - 36.0 g/dL   RDW 19.5 (H) 11.5 - 15.5 %   Platelets 138 (L) 150 - 400 K/uL   nRBC 0.0 0.0 - 0.2 %  Creatinine, serum     Status: None   Collection Time: 08/06/20  1:47 PM  Result Value Ref Range   Creatinine, Ser 0.89 0.61 - 1.24 mg/dL   GFR, Estimated >60 >60 mL/min  Magnesium     Status: None   Collection Time: 08/06/20  1:47 PM  Result Value Ref Range   Magnesium 1.9 1.7 - 2.4 mg/dL  Vitamin B12     Status: None   Collection Time: 08/06/20  1:47 PM  Result Value Ref Range   Vitamin  B-12 408 180 - 914 pg/mL  Folate, serum, performed at [facility] lab     Status: None   Collection Time: 08/06/20  1:47 PM  Result Value Ref Range   Folate 19.2 >5.9 ng/mL  Basic metabolic panel     Status: Abnormal   Collection Time: 08/07/20  3:28 AM  Result Value Ref Range   Sodium 140 135 - 145 mmol/L   Potassium 4.2 3.5 - 5.1 mmol/L   Chloride 107 98 - 111 mmol/L   CO2 30 22 - 32 mmol/L   Glucose, Bld 101 (H) 70 - 99 mg/dL   BUN 15 8 - 23 mg/dL   Creatinine, Ser 0.85 0.61 - 1.24 mg/dL   Calcium 8.7 (L) 8.9 - 10.3 mg/dL   GFR, Estimated >60 >60 mL/min   Anion gap 3 (L) 5 - 15  CBC     Status: Abnormal   Collection Time: 08/07/20  3:28 AM  Result Value Ref Range   WBC 10.1 4.0 - 10.5 K/uL   RBC 2.23 (L) 4.22 - 5.81 MIL/uL   Hemoglobin 7.7 (L) 13.0 - 17.0 g/dL   HCT 24.2 (L) 39.0 - 52.0 %   MCV 108.5 (H) 80.0 - 100.0 fL   MCH 34.5 (H) 26.0 - 34.0 pg   MCHC 31.8 30.0 - 36.0 g/dL   RDW 19.6 (H) 11.5 - 15.5 %   Platelets 126 (L) 150 - 400 K/uL   nRBC 0.0 0.0 - 0.2 %    DG Pelvis 1-2 Views  Result Date: 08/06/2020 CLINICAL DATA:  Recent fall with right hip pain, initial encounter EXAM: PELVIS - 1-2 VIEW COMPARISON:  None. FINDINGS: Bilateral hip replacements are noted. No dislocation is noted. Lucency is seen in the greater trochanter suspicious for periprosthetic fracture. Multiple healed fractures are noted within the pelvis. Lucency is seen within the right acetabulum medially suspicious for acute nondisplaced fracture. CT  of the pelvis may be helpful for further evaluation. IMPRESSION: Changes consistent with right greater trochanter periprosthetic fracture. Lucency in the medial aspect of the right acetabulum suspicious for undisplaced fracture. Electronically Signed   By: Inez Catalina M.D.   On: 08/06/2020 08:59   CT Hip Right Wo Contrast  Result Date: 08/06/2020 CLINICAL DATA:  Right hip pain since fall yesterday. EXAM: CT OF THE RIGHT HIP WITHOUT CONTRAST TECHNIQUE:  Multidetector CT imaging of the right hip was performed according to the standard protocol. Multiplanar CT image reconstructions were also generated. COMPARISON:  Pelvis and right femur x-rays from same day. FINDINGS: Bones/Joint/Cartilage Prior right total hip arthroplasty. No evidence of hardware failure or loosening. Acute nondisplaced fracture of the right greater trochanter (series 6, image 22). Acute nondisplaced fractures of the right medial and posterior acetabulum (series 6, images 48 and 29). Acute mildly displaced fracture of the right inferior pubic ramus (series 3, image 67). Old healed fractures of the left superior and inferior pubic rami. No joint effusion. Ligaments Ligaments are suboptimally evaluated by CT. Muscles and Tendons Grossly intact.  Right gluteus muscle atrophy. Soft tissue Small hematoma overlying the greater trochanter. No soft tissue mass. IMPRESSION: 1. Acute nondisplaced fracture of the right greater trochanter. 2. Acute nondisplaced fractures of the right medial and posterior acetabulum. 3. Acute mildly displaced fracture of the right inferior pubic ramus. 4. Prior right total hip arthroplasty without evidence of hardware complication. Electronically Signed   By: Titus Dubin M.D.   On: 08/06/2020 10:24   DG Femur Min 2 Views Right  Result Date: 08/06/2020 CLINICAL DATA:  Recent fall with right leg pain, initial encounter EXAM: RIGHT FEMUR 2 VIEWS COMPARISON:  None. FINDINGS: Right hip replacement is noted. No dislocation is seen. There is lucency identified in the region of the greater trochanter suspicious for underlying periprosthetic fracture. Additionally lucency is seen within the acetabulum on the right consistent with an undisplaced fracture. Distal femur appears within normal limits. Degenerative changes in the knee joint are seen. Multiple old healed fractures are noted within the pelvis. IMPRESSION: Findings suggestive of right greater trochanter periprosthetic  fracture. Findings consistent with undisplaced right acetabular fracture. CT may be helpful for further evaluation. Electronically Signed   By: Inez Catalina M.D.   On: 08/06/2020 08:57    Assessment/Plan: Right hip periprosthetic greater trochanter fracture, acetabular fractures, right inferior pubic ramus fracture - continue with nonoperative treatment, please see Dr. Luanna Cole addendum to consult note - WBAT RLE - pending PT and OT evals   Contact information:   Weekdays 8-5 Merlene Pulling, PA-C   After hours and holidays please check [email or web address] for group call information for [facility]  Ventura Bruns 08/07/2020, 7:21 AM

## 2020-08-07 NOTE — Progress Notes (Signed)
Initial Nutrition Assessment  DOCUMENTATION CODES:   Severe malnutrition in context of chronic illness, Underweight  INTERVENTION:   -Ensure Enlive po BID, each supplement provides 350 kcal and 20 grams of protein  -Boost Plus chocolate- Each supplement provides 360kcal and 14g protein.     -Vital Cuisine daily, each provides 520 kcals and 22g protein. -nectar thick  If swallowing unable to improve, may need to place feeding tube for nutrition support.  NUTRITION DIAGNOSIS:   Severe Malnutrition related to dysphagia, chronic illness as evidenced by energy intake < or equal to 75% for > or equal to 1 month, severe fat depletion.  GOAL:   Patient will meet greater than or equal to 90% of their needs  MONITOR:   PO intake, Supplement acceptance, Weight trends, I & O's, Labs, Skin  REASON FOR ASSESSMENT:   Consult Hip fracture protocol  ASSESSMENT:   80 y.o. male with history of hearing loss in left ear, coronary artery disease, constipation, osteoporosis, pulmonary fibrosis, anemia, cancer, memory loss who complains of right hip pain after fall. Admitted for Right hip periprosthetic greater trochanter fracture, acetabular fractures, right inferior pubic ramus fracture.  Pt in room with daughter at bedside. Pt unable to answer questions in full, seemed a bit lethargic. Pt's daughter states this is the first day where he has shown signs of delirium. At the end of the visit, pt slapped the daughter's hands away.  Pt was also irritable and at one point states he didn't want to talk about "any of it "(assuming nutrition needs and/or past living situation). Does state that he believes higher protein shakes cause gout flares for him.   SLP evaluated pt today. Recommend liquids via tsps only. Will order Ensure, Boost and Hormel shakes for pt to try.   Per daughter, pt had been living with his second wife but he left there stating he did not feel safe. For the past week he has been  living with his daughter. Pt had been eating 3 meals a day but small portions. Had been drinking 1 original Ensure at night before bed.   Pt reports 15-20 lbs of weight loss. Details were not given. Per records in care everywhere, pt weighed 125 lbs on 02/10/20. This would be insignificant weight loss but with pt's current weight status, any weight loss is significant.   Medications reviewed.  Labs reviewed.  NUTRITION - FOCUSED PHYSICAL EXAM:  Flowsheet Row Most Recent Value  Orbital Region Severe depletion  Upper Arm Region Severe depletion  Thoracic and Lumbar Region Unable to assess  Buccal Region Severe depletion  Temple Region Severe depletion  Clavicle Bone Region Unable to assess  Clavicle and Acromion Bone Region Unable to assess  Scapular Bone Region Unable to assess  Dorsal Hand Unable to assess  Patellar Region Unable to assess  Anterior Thigh Region Unable to assess  Posterior Calf Region Unable to assess  Edema (RD Assessment) None      *Not completed in full,  pt slapped daughter's hand.   Diet Order:   Diet Order             Diet Heart Room service appropriate? Yes; Fluid consistency: Thin  Diet effective now                   EDUCATION NEEDS:   Education needs have been addressed  Skin:  Skin Assessment: Reviewed RN Assessment  Last BM:  6/16 -type 2  Height:   Ht Readings from Last  1 Encounters:  08/06/20 5\' 9"  (1.753 m)    Weight:   Wt Readings from Last 1 Encounters:  08/07/20 53 kg    BMI:  Body mass index is 17.26 kg/m.  Estimated Nutritional Needs:   Kcal:  1850-2050  Protein:  85-100g  Fluid:  2L/day   Clayton Bibles, MS, RD, LDN Inpatient Clinical Dietitian Contact information available via Amion

## 2020-08-07 NOTE — Progress Notes (Signed)
Spoke with  and updated patient's daughter Broadus John.  Daughter aware of intermittent confusion and occasional difficulty swallowing.   Daughter request case mgmt consult to check possible availability of SNF facilities [place]    Will place request in epic chart front and follow up with day shift RN.

## 2020-08-07 NOTE — Progress Notes (Signed)
Spoke with daughter and updated her on plan of care.   Johnny Bridge, MD

## 2020-08-07 NOTE — TOC Initial Note (Signed)
Transition of Care [facility]) - Initial/Assessment Note   Patient Details  Name: Adam Owen MRN: [id number] Date of Birth: 01-12-1941  [facility] CM/SW Contact:    Sherie Don, LCSW Phone Number: 08/07/2020, 2:34 PM  Clinical Narrative: Patient is a 80 year old male who was admitted for a closed right hip fracture. PT evaluation recommended SNF. Daughter is agreeable to SNF and is aware it may be difficult to find an SNF that accepts the patient's insurance. FL2 done; PASRR received. Initial referral faxed out.  Expected Discharge Plan: Skilled Nursing Facility Barriers to Discharge: Continued Medical Work up, SNF Pending bed offer, Insurance Authorization  Patient Goals and CMS Choice Patient states their goals for this hospitalization and ongoing recovery are:: Improve mobility CMS Medicare.gov Compare Post Acute Care list provided to:: Patient Represenative (must comment) Choice offered to / list presented to : Adult Children  Expected Discharge Plan and Services Expected Discharge Plan: [place] In-house Referral: Clinical Social Work Post Acute Care Choice: [place] Living arrangements for the past 2 months: [place]           DME Arranged: N/A DME Agency: NA  Prior Living Arrangements/Services Living arrangements for the past 2 months: Single Family Home Lives with:: Adult Children Patient language and need for interpreter reviewed:: Yes Do you feel safe going back to the place where you live?: Yes (Patient feels safe living with daughter in [place], but unsafe with returning to live with his wife in [place].)      Need for Family Participation in Patient Care: Yes (Comment) Care giver support system in place?: Yes (comment) Criminal Activity/Legal Involvement Pertinent to Current Situation/Hospitalization: No - Comment as needed  Activities of Daily Living Home Assistive Devices/Equipment: Eyeglasses,  Hearing aid, Walker (specify type) (right hearing aid) ADL Screening (condition at time of admission) Patient's cognitive ability adequate to safely complete daily activities?: Yes Is the patient deaf or have difficulty hearing?: Yes (wears hearing aid on right-deaf on left) Does the patient have difficulty seeing, even when wearing glasses/contacts?: No Does the patient have difficulty concentrating, remembering, or making decisions?: Yes Patient able to express need for assistance with ADLs?: Yes Does the patient have difficulty dressing or bathing?: Yes Independently performs ADLs?: No Communication: Independent Dressing (OT): Needs assistance Is this a change from baseline?: Change from baseline, expected to last >3 days Grooming: Independent Feeding: Independent Bathing: Needs assistance Is this a change from baseline?: Change from baseline, expected to last >3 days Toileting: Dependent Is this a change from baseline?: Change from baseline, expected to last >3days In/Out Bed: Dependent Is this a change from baseline?: Change from baseline, expected to last >3 days Walks in Home: Dependent Is this a change from baseline?: Change from baseline, expected to last >3 days Does the patient have difficulty walking or climbing stairs?: Yes (secondary to right hip pain) Weakness of Legs: Right Weakness of Arms/Hands: None  Permission Sought/Granted Permission sought to share information with : Facility Art therapist granted to share information with : Yes, Verbal Permission Granted Permission granted to share info w AGENCY: SNFs  Emotional Assessment Orientation: : Oriented to Place, Oriented to Self Alcohol / Substance Use: Not Applicable Psych Involvement: No (comment)  Admission diagnosis:  Fall [W19.XXXA] Closed right hip fracture (B and E) [S72.001A] Contusion of right hip, initial encounter [S70.01XA] Fall, initial encounter [W19.XXXA] Patient Active Problem  List   Diagnosis Date Noted   Closed right hip fracture ([place])  08/06/2020   PCP:  Pcp, No Pharmacy:   [id number] [place], [place] [address] Phone: [phone] Fax: [phone]  Readmission Risk Interventions No flowsheet data found.

## 2020-08-08 ENCOUNTER — Inpatient Hospital Stay (HOSPITAL_COMMUNITY): Payer: Medicare (Managed Care)

## 2020-08-08 DIAGNOSIS — E43 Unspecified severe protein-calorie malnutrition: Secondary | ICD-10-CM

## 2020-08-08 LAB — CBC WITH DIFFERENTIAL/PLATELET
Abs Immature Granulocytes: 0.07 10*3/uL (ref 0.00–0.07)
Basophils Absolute: 0 10*3/uL (ref 0.0–0.1)
Basophils Relative: 0 %
Eosinophils Absolute: 0.3 10*3/uL (ref 0.0–0.5)
Eosinophils Relative: 4 %
HCT: 26.4 % — ABNORMAL LOW (ref 39.0–52.0)
Hemoglobin: 8.2 g/dL — ABNORMAL LOW (ref 13.0–17.0)
Immature Granulocytes: 1 %
Lymphocytes Relative: 10 %
Lymphs Abs: 0.9 10*3/uL (ref 0.7–4.0)
MCH: 33.7 pg (ref 26.0–34.0)
MCHC: 31.1 g/dL (ref 30.0–36.0)
MCV: 108.6 fL — ABNORMAL HIGH (ref 80.0–100.0)
Monocytes Absolute: 0.7 10*3/uL (ref 0.1–1.0)
Monocytes Relative: 8 %
Neutro Abs: 6.8 10*3/uL (ref 1.7–7.7)
Neutrophils Relative %: 77 %
Platelets: 130 10*3/uL — ABNORMAL LOW (ref 150–400)
RBC: 2.43 MIL/uL — ABNORMAL LOW (ref 4.22–5.81)
RDW: 19.6 % — ABNORMAL HIGH (ref 11.5–15.5)
WBC: 8.8 10*3/uL (ref 4.0–10.5)
nRBC: 0 % (ref 0.0–0.2)

## 2020-08-08 LAB — PHOSPHORUS: Phosphorus: 2.6 mg/dL (ref 2.5–4.6)

## 2020-08-08 LAB — COMPREHENSIVE METABOLIC PANEL
ALT: 21 U/L (ref 0–44)
AST: 22 U/L (ref 15–41)
Albumin: 2.4 g/dL — ABNORMAL LOW (ref 3.5–5.0)
Alkaline Phosphatase: 78 U/L (ref 38–126)
Anion gap: 4 — ABNORMAL LOW (ref 5–15)
BUN: 13 mg/dL (ref 8–23)
CO2: 27 mmol/L (ref 22–32)
Calcium: 8.9 mg/dL (ref 8.9–10.3)
Chloride: 107 mmol/L (ref 98–111)
Creatinine, Ser: 0.73 mg/dL (ref 0.61–1.24)
GFR, Estimated: >60 mL/min (ref >60)
Glucose, Bld: 100 mg/dL — ABNORMAL HIGH (ref 70–99)
Potassium: 3.9 mmol/L (ref 3.5–5.1)
Sodium: 138 mmol/L (ref 135–145)
Total Bilirubin: 0.6 mg/dL (ref 0.3–1.2)
Total Protein: 5.8 g/dL — ABNORMAL LOW (ref 6.5–8.1)

## 2020-08-08 LAB — MAGNESIUM: Magnesium: 1.7 mg/dL (ref 1.7–2.4)

## 2020-08-08 MED ORDER — MAGNESIUM SULFATE 2 GM/50ML IV SOLN
2.0000 g | Freq: Once | INTRAVENOUS | Status: AC
  Administered 2020-08-08: 2 g via INTRAVENOUS
  Filled 2020-08-08: qty 50

## 2020-08-08 NOTE — Consult Note (Addendum)
[place] Nurse Consult Note: Reason for Consult: Consult requested for left hand.  Pt has a full thickness skin tear which occurred prior to hospital stay  Wound type: Full thickness abrasion; 2X.2X.1cm, skin approximated over 50% of the wound, small amt bleeding. Pt has thin fragile skin with several bruised areas surrounding the location. Applied 3 steristrips to assist with approximating the skin., then xerforom gauze and foam to promote healing.  Dressing procedure/placement/frequency: Topical treatment orders provided for bedside nurses to perform as follows: Change xeroform gauze to left hand Q day.  Leave steristrips in place until they fall off.  Cover with foam dressings (Change foam dressings Q 3 days or PRN soiling.) Please re-consult if further assistance is needed.  Thank-you,  Julien Girt MSN, [place], [place], [place], [place]

## 2020-08-08 NOTE — Progress Notes (Signed)
   08/08/20 2110  Assess: MEWS Score  Temp 98.8 F (37.1 C)  BP (!) 100/59  Pulse Rate 70  Resp (!) 23  SpO2 93 %  O2 Device Room Air  Assess: MEWS Score  MEWS Temp 0  MEWS Systolic 1  MEWS Pulse 0  MEWS RR 1  MEWS LOC 0  MEWS Score 2  MEWS Score Color Yellow  Assess: if the MEWS score is Yellow or Red  Were vital signs taken at a resting state? Yes  Focused Assessment No change from prior assessment (Pt BP normally runs low)  Does the patient meet 2 or more of the SIRS criteria? No  MEWS guidelines implemented *See Row Information* No, vital signs rechecked  Treat  Pain Scale 0-10  Pain Score 0  Notify: Charge Nurse/RN  Name of Charge Nurse/RN Notified Patti, RN  Date Charge Nurse/RN Notified 08/08/20  Time Charge Nurse/RN Notified 2225  Assess: SIRS CRITERIA  SIRS Temperature  0  SIRS Pulse 0  SIRS Respirations  1  SIRS WBC 0  SIRS Score Sum  1

## 2020-08-08 NOTE — Progress Notes (Addendum)
Modified Barium Swallow Progress Note  Patient Details  Name: Adam Owen MRN: [id number] Date of Birth: 26-Jan-1941  Today's Date: 08/08/2020  Modified Barium Swallow completed.  Full report located under Chart Review in the Imaging Section.  Brief recommendations include the following:  Clinical Impression  Patient presents with mild oral and moderately severe pharyngeal dysphagia. Pt's dysphagia likely due to combination of anatomical anamolies including shortened palate, posterior lingual resection on right for cancer and deconditioning causing exacerbation.   Dysphagia evidenced by impaired velopharyngeal closure, compromise tongue base retraction resulting in decreased epiglottic deflection and impaired airway protection. In addition patient with decreased pharyngeal motility and hyo- laryngeal elevation allowing penetration and aspiration and also pharyngeal retention. Aspiration and penetration occur before , During, and after the swallow due to the above deficits. Patient did not sense mild aspiration. Subtle throat clear noted with mild aspiration. He also did not consistently since pharyngeal retention but dry swallows and cough/hawk with expectoration facilitated pharyngeal clearance. Placing head of bed at 20 allowed residual to remain in posterior pharynx decreasing amount of penetration and aspiration. Pharyngeal retention was much worse with solids and pures. SLP suspects this patient's dysphagia is at his baseline, given report of swallow ability during MBS being normal for him. Advised patient and daughter [name] to swallow precautions to mitigate aspiration and help maximize nutrition. Concerns for adequacy of nutrition present given level of dysphagia however anticipate patient would be able to consume adequate amounts of liquid nutrition. Reiterated that patient's tolerance of aspiration May be more compromised at this point given decreased mobility. Recommend very strict  swallow precautions and compensation strategies. Doubtful for swallowing improvement given suspected chronicity of dysphagia.   Swallow Evaluation Recommendations       SLP Diet Recommendations: Other (Comment) (full liquids while in hospital acutely ill)  nectar thick - focus on liquid nutrition - creamed soups, etc     Thin ok between meals without other intake   Liquid Administration via: Spoon;Straw   Medication Administration: Other (Comment) (crush with ensure)    NO CHIN TUCK!!!!!    Compensations: Slow rate;Small sips/bites;Other (Comment);Hard cough after swallow;Multiple dry swallows after each bite/sip;Effortful swallow (HOB at 20*)   Postural Changes: Other (Comment) (20* HOB!!!)   Oral Care Recommendations: Oral care BID   Other Recommendations: Have oral suction available   Kathleen Lime, MS [facility] SLP Acute Rehab Services Office [phone] Pager [phone]  Macario Golds 08/08/2020,10:36 AM

## 2020-08-08 NOTE — TOC Progression Note (Addendum)
Transition of Care [facility]) - Progression Note   Patient Details  Name: Jaquis Picklesimer MRN: [id number] Date of Birth: [birth year]  [facility]) CM/SW [place], LCSW Phone Number: 08/08/2020, 12:39 PM  Clinical Narrative: Patient has received 7 bed denials as his insurance is not in-network with the local SNFs. CSW called the following SNFs in [place] to see if any are in-network with his [employer] Medicare:  [facility]: left VM for Margreta Journey in admissions [place]: left VM for Judson Roch in admissions [place]: not in-network with patient's specific Medicare [facility] left VM for [place] in admissions [facility]: left VM requesting call back  CSW left VM for patient's daughter updating her regarding the patient's situation. TOC to continue looking for SNF placement.  Addendum: CSW received call from [place] with [facility] to fax clinicals for review. Clinicals faxed to Judson Roch at [phone].  Expected Discharge Plan: [place] Barriers to Discharge: Continued Medical Work up, SNF Pending bed offer, Ship broker  Expected Discharge Plan and Services Expected Discharge Plan: [place] In-house Referral: Clinical Social Work Post Acute Care Choice: [place] Living arrangements for the past 2 months: Single Family Home              DME Arranged: N/A DME Agency: NA  Readmission Risk Interventions No flowsheet data found.

## 2020-08-08 NOTE — Progress Notes (Signed)
PROGRESS NOTE    Adam Owen  [id number] DOB: 24-Nov-1940 DOA: 08/06/2020 PCP: Pcp, No   Brief Narrative:  The patient is a 80 year old Caucasian male with a past medical history significant for but not limited to CAD, pulmonary fibrosis, history of memory loss, GERD, gout, hypertension, hyperlipidemia as well as other multiple comorbidities who was brought into the ED by his daughter after a fall.  History is mostly obtained from the daughter over the phone and the ED physician and reportedly patient has been having more frequent falls and due to his social situations has been living with his daughter.  He had an unwitnessed fall yesterday where he landed on his right hip and was unable to get up or bear weight.  Upon arrival to the ED he was hemodynamically stable and initial x-rays were done showed that the right greater trochanter.  Prosthetic fracture was undisplaced right acetabular fracture.  Orthopedic surgery evaluated and opinion that this will be managed nonsurgically and recommended admission to the hospital service.  He underwent SLP evaluation and speech therapist recommending a full liquid diet well hospitalized and his nectar thick with thin okay between meals without other intake.  PT OT evaluating and recommending SNF and social worker in the process of trying to secure a bed as he is out of state insurance.  He has been faxed out to the facilities in [place].  Assessment & Plan:   Active Problems:   Closed right hip fracture (HCC)   Protein-calorie malnutrition, severe  Acute nondisplaced fracture of the right greater trochanter/fracture of the right medial and posterior acetabulum/moderate displaced fracture of the right inferior pubic rami -DG Pelvis and Femur showed "Changes consistent with right greater trochanter periprosthetic fracture.   Lucency in the medial aspect of the right acetabulum suspicious for undisplaced fracture" and "Findings suggestive of right  greater trochanter periprosthetic fracture. Findings consistent with undisplaced right  acetabular fracture. CT may be helpful for further evaluation." -CT Hip Right w/o Contrast showed "Acute nondisplaced fracture of the right greater trochanter. Acute nondisplaced fractures of the right medial and posterior acetabulum. Acute mildly displaced fracture of the  right inferior pubic ramus. Prior right total hip arthroplasty without evidence of hardware complication." -Orthopedic Sugery has been consulted and recommending nonsurgical management -Initially had him nonweightbearing but now they have recommended weightbearing as tolerated and will monitor serial x-rays -They are recommending PT OT evaluation and disposition pending the PT evaluation and they are recommending SNF; social worker is in the process of assisting SNF placement; he has been faxed out to [place] given his out-of-state insurance -Continue with pain control with hydrocodone-acetaminophen 1-2 tabs every 4 hours as needed for moderate pain -We will discontinue normal saline at 75 MLS per hour -Patient is VTE prophylaxis with enoxaparin 40 mg subcu every 24  Dysphagia -SLP consulted and MBS done.  Recommending full liquid diet with nectar thick liquids with thins in between  Hyperlipidemia -C/w Atorvastain 40 mg po qEvening   Hypertension -C/w Metoprolol Tartrate 25 mg po BID -Continue to Monitor BP per protocol -Last BP reading was 113/47  BPH -C/w Tamsulosin 0.8 mg po Daily   Macrocytic Anemia -Patient's MCV was 108.5 and his hemoglobin/hematocrit went from 8.9/27.6 -> 8.2/25.7 -> 7.7/24.2 -> and is now 8.2/26.4 with an MCV of 108.6 -Check anemia panel in a.m. -Continue to monitor for signs and symptoms of bleeding; currently no overt bleeding noted next-repeat CBC in a.m.  Thrombocytopenia -Patient's platelet count went from 153 ->  138 -> 126 -> 130 -continue monitor for signs and symptoms of  bleeding -Repeat CBC in a.m.  Leukocytosis -Likely reactive in the setting of his fall -Patient's WBC is trended down a lot from 10.7 -> 9.1 -> 10.1 -> 8.8 -Continue to Monitor and Trend   Left Hand Wound and Skin Tear -Patient has a full-thickness skin tear which occurred when he hit his hand.  His skin is very thin and fragile with several bruised areas from the location.  The nursing staff has applied 3 Steri-Strips and Xeroform gauze to promote healing and recommending topical treatment orders continue the Xeroform gauze to the left hand every day and leave the Steri-Strips in place until they fall off and covering with foam dressings for 3 days  Severe protein calorie malnutrition in the context of Chronic illness/ Underweight -Nutritionist was consulted for further evaluation and assistance -Estimated body mass index is 16.31 kg/m as calculated from the following:   Height as of this encounter: 5\' 9"  (1.753 m).   Weight as of this encounter: 50.1 kg. -They are recommending Ensure Enlive p.o. twice daily, boost plus chocolate, vital accusing daily and if his swallowing is unable to improve they are recommending placing a feeding tube for nutritional support -The speech therapist is recommending patient consuming liquids via teaspoon for the day and medications crushed with Ensure and they are completing an MBS in the next day   DVT prophylaxis: Enoxaparin 40 mg sq q24h Code Status: FULL CODE  Family Communication: Discussed with the daughters at bedside Disposition Plan: Pending further clinical improvement. Likley to go to SNF  Status is: Inpatient  Remains inpatient appropriate because:Unsafe d/c plan, IV treatments appropriate due to intensity of illness or inability to take PO, and Inpatient level of care appropriate due to severity of illness  Dispo: The patient is from: Home              Anticipated d/c is to: SNF              Patient currently is not medically stable to  d/c.   Difficult to place patient No  Consultants:  Orthopedic Surgery   Procedures: None   Antimicrobials:  Anti-infectives (From admission, onward)    None        Subjective: Seen and examined at bedside and was complaining of some left arm pain.  No nausea or vomiting.  Had a skin tear this morning.  Denies any chest pain or shortness of breath.  Has not ambulated out of bed but was in the chair yesterday.  States he has a little constipation but does not want to try any laxatives.  No other concerns or complaints at this time.  Objective: Vitals:   08/07/20 2226 08/08/20 0500 08/08/20 0943 08/08/20 1349  BP: 131/66 112/70 (!) 97/57 (!) 113/47  Pulse: 72 70 76 66  Resp: 16 17  (!) 21  Temp: 98 F (36.7 C) 97.9 F (36.6 C)  97.7 F (36.5 C)  TempSrc: Oral Oral  Oral  SpO2: 100% 98%  100%  Weight:  50.1 kg    Height:        Intake/Output Summary (Last 24 hours) at 08/08/2020 1615 Last data filed at 08/08/2020 1400 Gross per 24 hour  Intake 1489.07 ml  Output 2650 ml  Net -1160.93 ml    Filed Weights   08/06/20 0807 08/07/20 0600 08/08/20 0500  Weight: 52 kg 53 kg 50.1 kg   Examination: Physical Exam:  Constitutional:  Thin cachectic chronically ill-appearing Caucasian male currently in no acute distress appears calm and is a little sleepy Eyes: Lids and conjunctivae normal, sclerae anicteric  ENMT: External Ears, Nose appear normal.  Is a little hard of hearing Neck: Appears normal, supple, no cervical masses, normal ROM, no appreciable thyromegaly; no JVD Respiratory: Diminished to auscultation bilaterally, no wheezing, rales, rhonchi or crackles. Normal respiratory effort and patient is not tachypenic. No accessory muscle use.  Unlabored breathing Cardiovascular: RRR, no murmurs / rubs / gallops. S1 and S2 auscultated. No extremity edema.  Abdomen: Soft, non-tender, non-distended. Bowel sounds positive.  GU: Deferred. Musculoskeletal: No clubbing / cyanosis  of digits/nails.  Skin: Left hand has a skin tear and cut that is wrapped no induration; Warm and dry.  Neurologic: CN 2-12 grossly intact with no focal deficits. Romberg sign cerebellar reflexes not assessed.  Psychiatric: Mildly judgment and insight.  He is a little somnolent and sleepy but easily arousable and and oriented x 2. Normal mood and appropriate affect.   Data Reviewed: I have personally reviewed following labs and imaging studies  CBC: Recent Labs  Lab 08/06/20 0825 08/06/20 1347 08/07/20 0328 08/08/20 0326  WBC 10.7* 9.1 10.1 8.8  NEUTROABS 9.2*  --   --  6.8  HGB 8.9* 8.2* 7.7* 8.2*  HCT 27.6* 25.7* 24.2* 26.4*  MCV 106.6* 108.9* 108.5* 108.6*  PLT 153 138* 126* 130*    Basic Metabolic Panel: Recent Labs  Lab 08/06/20 0825 08/06/20 1347 08/07/20 0328 08/08/20 0326  NA 139  --  140 138  K 4.1  --  4.2 3.9  CL 103  --  107 107  CO2 31  --  30 27  GLUCOSE 112*  --  101* 100*  BUN 16  --  15 13  CREATININE 0.84 0.89 0.85 0.73  CALCIUM 9.7  --  8.7* 8.9  MG  --  1.9  --  1.7  PHOS  --   --   --  2.6    GFR: Estimated Creatinine Clearance: 53.1 mL/min (by C-G formula based on SCr of 0.73 mg/dL). Liver Function Tests: Recent Labs  Lab 08/08/20 0326  AST 22  ALT 21  ALKPHOS 78  BILITOT 0.6  PROT 5.8*  ALBUMIN 2.4*   No results for input(s): LIPASE, AMYLASE in the last 168 hours. No results for input(s): AMMONIA in the last 168 hours. Coagulation Profile: No results for input(s): INR, PROTIME in the last 168 hours. Cardiac Enzymes: No results for input(s): CKTOTAL, CKMB, CKMBINDEX, TROPONINI in the last 168 hours. BNP (last 3 results) No results for input(s): PROBNP in the last 8760 hours. HbA1C: No results for input(s): HGBA1C in the last 72 hours. CBG: No results for input(s): GLUCAP in the last 168 hours. Lipid Profile: No results for input(s): CHOL, HDL, LDLCALC, TRIG, CHOLHDL, LDLDIRECT in the last 72 hours. Thyroid Function Tests: No  results for input(s): TSH, T4TOTAL, FREET4, T3FREE, THYROIDAB in the last 72 hours. Anemia Panel: Recent Labs    08/06/20 1347  VITAMINB12 408  FOLATE 19.2    Sepsis Labs: No results for input(s): PROCALCITON, LATICACIDVEN in the last 168 hours.  Recent Results (from the past 240 hour(s))  Resp Panel by RT-PCR (Flu A&B, Covid) Nasopharyngeal Swab     Status: None   Collection Time: 08/06/20  8:26 AM   Specimen: Nasopharyngeal Swab; Nasopharyngeal(NP) swabs in vial transport medium  Result Value Ref Range Status   SARS Coronavirus 2 by RT PCR NEGATIVE NEGATIVE  Final    Comment: (NOTE) SARS-CoV-2 target nucleic acids are NOT DETECTED.  The SARS-CoV-2 RNA is generally detectable in upper respiratory specimens during the acute phase of infection. The lowest concentration of SARS-CoV-2 viral copies this assay can detect is 138 copies/mL. A negative result does not preclude SARS-Cov-2 infection and should not be used as the sole basis for treatment or other patient management decisions. A negative result may occur with  improper specimen collection/handling, submission of specimen other than nasopharyngeal swab, presence of viral mutation(s) within the areas targeted by this assay, and inadequate number of viral copies(<138 copies/mL). A negative result must be combined with clinical observations, patient history, and epidemiological information. The expected result is Negative.  Fact Sheet for Patients:  EntrepreneurPulse.com.au  Fact Sheet for Healthcare Providers:  IncredibleEmployment.be  This test is no t yet approved or cleared by the [place] FDA and  has been authorized for detection and/or diagnosis of SARS-CoV-2 by FDA under an Emergency Use Authorization (EUA). This EUA will remain  in effect (meaning this test can be used) for the duration of the COVID-19 declaration under Section 564(b)(1) of the Act, 21 U.S.C.section  360bbb-3(b)(1), unless the authorization is terminated  or revoked sooner.       Influenza A by PCR NEGATIVE NEGATIVE Final   Influenza B by PCR NEGATIVE NEGATIVE Final    Comment: (NOTE) The Xpert Xpress SARS-CoV-2/FLU/RSV plus assay is intended as an aid in the diagnosis of influenza from Nasopharyngeal swab specimens and should not be used as a sole basis for treatment. Nasal washings and aspirates are unacceptable for Xpert Xpress SARS-CoV-2/FLU/RSV testing.  Fact Sheet for Patients: EntrepreneurPulse.com.au  Fact Sheet for Healthcare Providers: IncredibleEmployment.be  This test is not yet approved or cleared by the [place] FDA and has been authorized for detection and/or diagnosis of SARS-CoV-2 by FDA under an Emergency Use Authorization (EUA). This EUA will remain in effect (meaning this test can be used) for the duration of the COVID-19 declaration under Section 564(b)(1) of the Act, 21 U.S.C. section 360bbb-3(b)(1), unless the authorization is terminated or revoked.  Performed at [facility], [address] [place],  [address]      RN Pressure Injury Documentation:     Estimated body mass index is 16.31 kg/m as calculated from the following:   Height as of this encounter: 5\' 9"  (1.753 m).   Weight as of this encounter: 50.1 kg.  Malnutrition Type:  Nutrition Problem: Severe Malnutrition Etiology: dysphagia, chronic illness  Malnutrition Characteristics:  Signs/Symptoms: energy intake < or equal to 75% for > or equal to 1 month, severe fat depletion  Nutrition Interventions:  Interventions: Ensure Enlive (each supplement provides 350kcal and 20 grams of protein), Hormel Shake, MVI, Magic cup, Boost Plus   Radiology Studies: DG Swallowing Func-Speech Pathology  Result Date: 08/08/2020 Formatting of this result is different from the original. Objective Swallowing Evaluation: Type of  Study: MBS-Modified Barium Swallow Study  Patient Details Name: Jese Comella MRN: [id number] Date of Birth: [birth year] Today's Date: 08/08/2020 Time: SLP Start Time (ACUTE ONLY): 4166 -SLP Stop Time (ACUTE ONLY): 0910 SLP Time Calculation (min) (ACUTE ONLY): 35 min Past Medical History: Past Medical History: Diagnosis Date  Anemia   Cancer ([place])   Constipation   Coronary artery disease   Edema   Gait abnormality   GERD (gastroesophageal reflux disease)   Gout   High cholesterol   Hypertension   Leg weakness   Memory loss   Myocardial  infarction ([place])   Obstructive sleep apnea   Osteoporosis   Premature heartbeats   Pulmonary fibrosis ([place])   Spinal stenosis of lumbar region   UTI (urinary tract infection)   Vertigo   Vitamin D deficiency  Past Surgical History: Past Surgical History: Procedure Laterality Date  CORONARY ARTERY BYPASS GRAFT    JOINT REPLACEMENT Bilateral  HPI: Patient is a 80 yo male adm to [facility] after fall at his daughter's home.  Found to have hip fx but no surgical intervention warranted. 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 Pt reportedly has occasional nasal regurgitation and coughing with po intake.  He saw ENT with plans for swallow study but this was not completed as patient now admitted to hospital after a fall. Per ENT's note, pt has acquired velopharyngeal incompetence and no recurrence of lesions.  Weight loss reported prior to pt moving to [place] with his daughter.  He appears to be frail and deconditioned.  Subjective: pt awake in chair Assessment / Plan / Recommendation CHL IP CLINICAL IMPRESSIONS 08/08/2020 Clinical Impression Patient presents with mild oral and moderately severe pharyngeal dysphagia. Pt's dysphagia likely due to combination of anatomical anamolies including shortened palate, posterior lingual resection on right for  cancer and deconditioning causing exacerbation.   Dysphagia evidenced by impaired velopharyngeal closure, compromise tongue base retraction resulting in decreased epiglottic deflection and impaired airway protection. In addition patient with decreased pharyngeal motility and hyo- laryngeal elevation allowing penetration and aspiration and also pharyngeal retention. Aspiration and penetration occur before , During, and after the swallow due to the above deficits. Patient did not sense mild aspiration. Subtle throat clear noted with mild aspiration. He also did not consistently since pharyngeal retention but dry swallows and cough/hawk with expectoration facilitated pharyngeal clearance. Placing head of bed at 20 allowed residual to remain in posterior pharynx decreasing amount of penetration and aspiration. Pharyngeal retention was much worse with solids and pures. SLP suspects this patient's dysphagia is at his baseline, given report of swallow ability during MBS being normal for him. Advised patient and daughter [name] to swallow precautions to mitigate aspiration and help maximize nutrition. Concerns for adequacy of nutrition present given level of dysphagia however anticipate patient would be able to consume adequate amounts of liquid nutrition. Reiterated that patient's tolerance of aspiration May be more compromised at this point given decreased mobility. Recommend very strict swallow precautions and compensation strategies. Doubtful for swallowing improvement given suspected chronicity of dysphagia. SLP Visit Diagnosis Dysphagia, pharyngoesophageal phase (R13.14);Dysphagia, unspecified (R13.10);Dysphagia, oropharyngeal phase (R13.12) Attention and concentration deficit following -- Frontal lobe and executive function deficit following -- Impact on safety and function Moderate aspiration risk;Risk for inadequate nutrition/hydration   CHL IP TREATMENT RECOMMENDATION 08/08/2020 Treatment Recommendations Therapy  as outlined in treatment plan below   Prognosis 08/08/2020 Prognosis for Safe Diet Advancement Fair Barriers to Reach Goals Time post onset;Behavior Barriers/Prognosis Comment -- CHL IP DIET RECOMMENDATION 08/08/2020 SLP Diet Recommendations Full liquids only, nectar thickened with meals, thin ok between meals without other intake Liquid Administration via Spoon;Straw Medication Administration Other (Comment) Compensations Slow rate;Small sips/bites;Other (Comment);Hard cough after swallow;Multiple dry swallows after each bite/sip;Effortful swallow Postural Changes Other (Comment)   CHL IP OTHER RECOMMENDATIONS 08/08/2020 Recommended Consults -- Oral Care Recommendations Oral care BID Other Recommendations Have oral suction available   CHL IP FOLLOW UP RECOMMENDATIONS 08/08/2020 Follow up Recommendations Skilled  Nursing facility   [facility] IP FREQUENCY AND DURATION 08/08/2020 Speech Therapy Frequency (ACUTE ONLY) min 2x/week Treatment Duration 1 week      CHL IP ORAL PHASE 08/08/2020 Oral Phase Impaired Oral - Pudding Teaspoon -- Oral - Pudding Cup -- Oral - Honey Teaspoon -- Oral - Honey Cup -- Oral - Nectar Teaspoon Decreased velopharyngeal closure Oral - Nectar Cup -- Oral - Nectar Straw Decreased bolus cohesion;Decreased velopharyngeal closure Oral - Thin Teaspoon Decreased velopharyngeal closure Oral - Thin Cup -- Oral - Thin Straw Decreased velopharyngeal closure Oral - Puree Decreased velopharyngeal closure;Lingual pumping;Weak lingual manipulation;Piecemeal swallowing Oral - Mech Soft Decreased velopharyngeal closure;Impaired mastication;Weak lingual manipulation;Lingual pumping;Piecemeal swallowing;Decreased bolus cohesion;Delayed oral transit Oral - Regular -- Oral - Multi-Consistency -- Oral - Pill -- Oral Phase - Comment --  CHL IP PHARYNGEAL PHASE 08/08/2020 Pharyngeal Phase Impaired Pharyngeal- Pudding Teaspoon -- Pharyngeal -- Pharyngeal- Pudding Cup -- Pharyngeal -- Pharyngeal- Honey Teaspoon -- Pharyngeal --  Pharyngeal- Honey Cup -- Pharyngeal -- Pharyngeal- Nectar Teaspoon Reduced pharyngeal peristalsis;Reduced epiglottic inversion;Reduced anterior laryngeal mobility;Reduced laryngeal elevation;Reduced airway/laryngeal closure;Reduced tongue base retraction;Penetration/Apiration after swallow;Pharyngeal residue - valleculae;Pharyngeal residue - pyriform;Pharyngeal residue - posterior pharnyx;Trace aspiration;Pharyngeal residue - cp segment Pharyngeal Material enters airway, passes BELOW cords without attempt by patient to eject out (silent aspiration) Pharyngeal- Nectar Cup -- Pharyngeal -- Pharyngeal- Nectar Straw Reduced pharyngeal peristalsis;Reduced epiglottic inversion;Reduced anterior laryngeal mobility;Reduced laryngeal elevation;Reduced airway/laryngeal closure;Reduced tongue base retraction;Penetration/Apiration after swallow;Trace aspiration;Pharyngeal residue - valleculae;Pharyngeal residue - pyriform;Pharyngeal residue - posterior pharnyx;Pharyngeal residue - cp segment;Nasopharyngeal reflux Pharyngeal Material enters airway, passes BELOW cords without attempt by patient to eject out (silent aspiration) Pharyngeal- Thin Teaspoon Penetration/Aspiration before swallow;Reduced pharyngeal peristalsis;Reduced epiglottic inversion;Reduced anterior laryngeal mobility;Reduced laryngeal elevation;Reduced airway/laryngeal closure;Reduced tongue base retraction;Pharyngeal residue - valleculae;Pharyngeal residue - pyriform;Pharyngeal residue - posterior pharnyx;Pharyngeal residue - cp segment Pharyngeal Material enters airway, passes BELOW cords without attempt by patient to eject out (silent aspiration) Pharyngeal- Thin Cup -- Pharyngeal -- Pharyngeal- Thin Straw Reduced pharyngeal peristalsis;Reduced epiglottic inversion;Reduced anterior laryngeal mobility;Reduced laryngeal elevation;Reduced airway/laryngeal closure;Reduced tongue base retraction;Pharyngeal residue - valleculae;Pharyngeal residue -  pyriform;Pharyngeal residue - posterior pharnyx;Pharyngeal residue - cp segment;Trace aspiration;Penetration/Aspiration during swallow;Penetration/Apiration after swallow;Penetration/Aspiration before swallow Pharyngeal Material enters airway, passes BELOW cords without attempt by patient to eject out (silent aspiration);Material enters airway, passes BELOW cords and not ejected out despite cough attempt by patient Pharyngeal- Puree Reduced pharyngeal peristalsis;Reduced epiglottic inversion;Reduced anterior laryngeal mobility;Reduced laryngeal elevation;Reduced airway/laryngeal closure;Reduced tongue base retraction;Pharyngeal residue - valleculae;Pharyngeal residue - pyriform;Pharyngeal residue - posterior pharnyx;Pharyngeal residue - cp segment Pharyngeal Material does not enter airway Pharyngeal- Mechanical Soft Reduced pharyngeal peristalsis;Reduced epiglottic inversion;Reduced anterior laryngeal mobility;Reduced laryngeal elevation;Reduced airway/laryngeal closure;Reduced tongue base retraction;Pharyngeal residue - valleculae;Pharyngeal residue - pyriform;Pharyngeal residue - posterior pharnyx;Pharyngeal residue - cp segment Pharyngeal Material does not enter airway Pharyngeal- Regular -- Pharyngeal -- Pharyngeal- Multi-consistency -- Pharyngeal -- Pharyngeal- Pill -- Pharyngeal -- Pharyngeal Comment chin tuck posture worsened swallow as it spilled pharyngeal retention into open airway, dry swallows needed with EVERY bolus to help decrease retention - pt largely only transits approx 10% to 20% of boluses into esophagus with initial swallow - cough, "hock" and expectoration also needed to help with clearance  CHL IP CERVICAL ESOPHAGEAL PHASE 08/08/2020 Cervical Esophageal Phase Impaired Pudding Teaspoon -- Pudding Cup -- Honey Teaspoon -- Honey Cup -- Nectar Teaspoon -- Nectar Cup -- Nectar Straw -- Thin Teaspoon -- Thin Cup -- Thin Straw -- Puree -- Mechanical Soft -- Regular -- Multi-consistency -- Pill --  Cervical Esophageal Comment -- Kathleen Lime, MS [facility] SLP Acute Rehab Services Office [phone] Pager [phone] Macario Golds 08/08/2020, 10:37 AM               Scheduled Meds:  aspirin EC  81 mg Oral Daily   atorvastatin  40 mg Oral QPM   enoxaparin (LOVENOX) injection  40 mg Subcutaneous Q24H   famotidine  20 mg Oral BID   feeding supplement  237 mL Oral BID BM   lactose free nutrition  237 mL Oral Q24H   metoprolol tartrate  25 mg Oral BID   tamsulosin  0.8 mg Oral QPM   Continuous Infusions:  sodium chloride 75 mL/hr at 08/08/20 0949    LOS: 2 days   Kerney Elbe, DO [facility] PAGER is on AMION  If 7PM-7AM, please contact night-coverage [email or web address]

## 2020-08-09 LAB — COMPREHENSIVE METABOLIC PANEL
ALT: 23 U/L (ref 0–44)
AST: 24 U/L (ref 15–41)
Albumin: 2.5 g/dL — ABNORMAL LOW (ref 3.5–5.0)
Alkaline Phosphatase: 84 U/L (ref 38–126)
Anion gap: 4 — ABNORMAL LOW (ref 5–15)
BUN: 13 mg/dL (ref 8–23)
CO2: 31 mmol/L (ref 22–32)
Calcium: 8.8 mg/dL — ABNORMAL LOW (ref 8.9–10.3)
Chloride: 103 mmol/L (ref 98–111)
Creatinine, Ser: 0.78 mg/dL (ref 0.61–1.24)
GFR, Estimated: >60 mL/min (ref >60)
Glucose, Bld: 104 mg/dL — ABNORMAL HIGH (ref 70–99)
Potassium: 4 mmol/L (ref 3.5–5.1)
Sodium: 138 mmol/L (ref 135–145)
Total Bilirubin: 0.5 mg/dL (ref 0.3–1.2)
Total Protein: 6 g/dL — ABNORMAL LOW (ref 6.5–8.1)

## 2020-08-09 LAB — CBC WITH DIFFERENTIAL/PLATELET
Abs Immature Granulocytes: 0.06 10*3/uL (ref 0.00–0.07)
Basophils Absolute: 0 10*3/uL (ref 0.0–0.1)
Basophils Relative: 0 %
Eosinophils Absolute: 0.2 10*3/uL (ref 0.0–0.5)
Eosinophils Relative: 2 %
HCT: 26 % — ABNORMAL LOW (ref 39.0–52.0)
Hemoglobin: 8.2 g/dL — ABNORMAL LOW (ref 13.0–17.0)
Immature Granulocytes: 1 %
Lymphocytes Relative: 8 %
Lymphs Abs: 0.8 10*3/uL (ref 0.7–4.0)
MCH: 34.5 pg — ABNORMAL HIGH (ref 26.0–34.0)
MCHC: 31.5 g/dL (ref 30.0–36.0)
MCV: 109.2 fL — ABNORMAL HIGH (ref 80.0–100.0)
Monocytes Absolute: 0.9 10*3/uL (ref 0.1–1.0)
Monocytes Relative: 8 %
Neutro Abs: 8.2 10*3/uL — ABNORMAL HIGH (ref 1.7–7.7)
Neutrophils Relative %: 81 %
Platelets: 141 10*3/uL — ABNORMAL LOW (ref 150–400)
RBC: 2.38 MIL/uL — ABNORMAL LOW (ref 4.22–5.81)
RDW: 19.8 % — ABNORMAL HIGH (ref 11.5–15.5)
WBC: 10.2 10*3/uL (ref 4.0–10.5)
nRBC: 0 % (ref 0.0–0.2)

## 2020-08-09 LAB — MAGNESIUM: Magnesium: 2 mg/dL (ref 1.7–2.4)

## 2020-08-09 LAB — PHOSPHORUS: Phosphorus: 2.2 mg/dL — ABNORMAL LOW (ref 2.5–4.6)

## 2020-08-09 MED ORDER — POLYETHYLENE GLYCOL 3350 17 G PO PACK
17.0000 g | PACK | Freq: Two times a day (BID) | ORAL | Status: DC
  Administered 2020-08-09 – 2020-08-17 (×7): 17 g via ORAL
  Filled 2020-08-09 (×9): qty 1

## 2020-08-09 MED ORDER — BISACODYL 10 MG RE SUPP: 10.0000 mg | Freq: Every day | RECTAL | Status: DC | PRN

## 2020-08-09 MED ORDER — SODIUM CHLORIDE 0.9 % IV BOLUS
500.0000 mL | Freq: Once | INTRAVENOUS | Status: AC
  Administered 2020-08-09: 500 mL via INTRAVENOUS

## 2020-08-09 MED ORDER — SENNOSIDES-DOCUSATE SODIUM 8.6-50 MG PO TABS
1.0000 | ORAL_TABLET | Freq: Two times a day (BID) | ORAL | Status: DC
  Administered 2020-08-09 – 2020-08-18 (×17): 1 via ORAL
  Filled 2020-08-09 (×18): qty 1

## 2020-08-09 NOTE — Plan of Care (Signed)
  Problem: Health Behavior/Discharge Planning: Goal: Ability to manage health-related needs will improve Outcome: Progressing   

## 2020-08-09 NOTE — Progress Notes (Signed)
PROGRESS NOTE    Adam Owen  [id number] DOB: [birth year] DOA: 08/06/2020 PCP: Pcp, No   Brief Narrative:  The patient is a 80 year old Caucasian male with a past medical history significant for but not limited to CAD, pulmonary fibrosis, history of memory loss, GERD, gout, hypertension, hyperlipidemia as well as other multiple comorbidities who was brought into the ED by his daughter after a fall.  History is mostly obtained from the daughter over the phone and the ED physician and reportedly patient has been having more frequent falls and due to his social situations has been living with his daughter.  He had an unwitnessed fall yesterday where he landed on his right hip and was unable to get up or bear weight.  Upon arrival to the ED he was hemodynamically stable and initial x-rays were done showed that the right greater trochanter.  Prosthetic fracture was undisplaced right acetabular fracture.  Orthopedic surgery evaluated and opinion that this will be managed nonsurgically and recommended admission to the hospital service.  He underwent SLP evaluation and speech therapist recommending a full liquid diet well hospitalized and his nectar thick with thin okay between meals without other intake.  PT OT evaluating and recommending SNF and social worker in the process of trying to secure a bed as he is out of state insurance.  He has been faxed out to the facilities in [place].  Assessment & Plan:   Active Problems:   Closed right hip fracture (HCC)   Protein-calorie malnutrition, severe  Acute nondisplaced fracture of the right greater trochanter/fracture of the right medial and posterior acetabulum/moderate displaced fracture of the right inferior pubic rami -DG Pelvis and Femur showed "Changes consistent with right greater trochanter periprosthetic fracture.   Lucency in the medial aspect of the right acetabulum suspicious for undisplaced fracture" and "Findings suggestive of right  greater trochanter periprosthetic fracture. Findings consistent with undisplaced right  acetabular fracture. CT may be helpful for further evaluation." -CT Hip Right w/o Contrast showed "Acute nondisplaced fracture of the right greater trochanter. Acute nondisplaced fractures of the right medial and posterior acetabulum. Acute mildly displaced fracture of the  right inferior pubic ramus. Prior right total hip arthroplasty without evidence of hardware complication." -Orthopedic Sugery has been consulted and recommending nonsurgical management -Initially had him nonweightbearing but now they have recommended weightbearing as tolerated and will monitor serial x-rays -They are recommending PT OT evaluation and disposition pending the PT evaluation and they are recommending [facility]; social worker is in the process of assisting SNF placement; he has been faxed out to [place] given his out-of-state insurance -Continue with pain control with hydrocodone-acetaminophen 1-2 tabs every 4 hours as needed for moderate pain -We will discontinue normal saline at 75 MLS per hour -Patient is VTE prophylaxis with enoxaparin 40 mg subcu every 24  Dysphagia -SLP consulted and MBS done.  Recommending full liquid diet with nectar thick liquids with thins in between  Hyperlipidemia -C/w Atorvastain 40 mg po qEvening   Hypertension -C/w Metoprolol Tartrate 25 mg po BID -Continue to Monitor BP per protocol -Last BP reading was 127/56 after bolus; Blood Pressure dropped to 89/43 after Metoprolol this AM   Constipation -Refused bowel regimen yesterday but will start today -Will start Senna-Docusate 1 tab po BID, Miralax 17 grams po BID and Bisacodyl Suppository 10 mg RC  BPH -C/w Tamsulosin 0.8 mg po Daily   Macrocytic Anemia -Patient's MCV was 108.5 and his hemoglobin/hematocrit went from 8.9/27.6 -> 8.2/25.7 -> 7.7/24.2 ->  8.2/26.4 -> 8.2/26.0 with an MCV of 109.2 -Check anemia panel in  a.m. -Continue to monitor for signs and symptoms of bleeding; currently no overt bleeding noted next-repeat CBC in a.m.  Thrombocytopenia -Patient's platelet count went from 153 -> 138 -> 126 -> 130 -> 141 -continue monitor for signs and symptoms of bleeding -Repeat CBC in a.m.  Leukocytosis -Likely reactive in the setting of his fall -Patient's WBC is trended down a lot from 10.7 -> 9.1 -> 10.1 -> 8.8 -> 10.2 -Continue to Monitor and Trend   Hx of Gout -Continue to Monitor -If worsens will start on Colchicine   Left Hand Wound and Skin Tear -Patient has a full-thickness skin tear which occurred when he hit his hand.  His skin is very thin and fragile with several bruised areas from the location.  The nursing staff has applied 3 Steri-Strips and Xeroform gauze to promote healing and recommending topical treatment orders continue the Xeroform gauze to the left hand every day and leave the Steri-Strips in place until they fall off and covering with foam dressings for 3 days  Severe protein calorie malnutrition in the context of Chronic illness/ Underweight -Nutritionist was consulted for further evaluation and assistance -Estimated body mass index is 15.92 kg/m as calculated from the following:   Height as of this encounter: 5\' 9"  (1.753 m).   Weight as of this encounter: 48.9 kg. -They are recommending Ensure Enlive p.o. twice daily, boost plus chocolate, vital accusing daily and if his swallowing is unable to improve they are recommending placing a feeding tube for nutritional support -The speech therapist is recommending patient consuming liquids via teaspoon for the day and medications crushed with Ensure and they are completing an MBS in the next day   DVT prophylaxis: Enoxaparin 40 mg sq q24h Code Status: FULL CODE  Family Communication: Discussed with the daughter in the hall  Disposition Plan: Pending further clinical improvement. Likley to go to SNF when bed is  available  Status is: Inpatient  Remains inpatient appropriate because:Unsafe d/c plan, IV treatments appropriate due to intensity of illness or inability to take PO, and Inpatient level of care appropriate due to severity of illness  Dispo: The patient is from: Home              Anticipated d/c is to: SNF              Patient currently is not medically stable to d/c.   Difficult to place patient No  Consultants:  Orthopedic Surgery   Procedures: None   Antimicrobials:  Anti-infectives (From admission, onward)    None        Subjective: Seen and examined at bedside and he was sitting in a chair listening to the sermon.  No nausea or vomiting.  Felt okay.  Denied complaints.  Daughter thinks that he needs a bowel regimen so this was initiated.  He had mentioned to his daughter about concern for his gout flaring with his Ensure drinks.  We will need to continue to monitor this carefully.  No other concerns or plans at this time.  Objective: Vitals:   08/09/20 0500 08/09/20 0524 08/09/20 1327 08/09/20 1651  BP:  116/66 (!) 89/43 (!) 127/56  Pulse:  77 (!) 59 (!) 56  Resp:  (!) 22 17   Temp:  97.9 F (36.6 C) 97.7 F (36.5 C)   TempSrc:  Oral Oral   SpO2:  97% 97% 92%  Weight: 48.9 kg  Height:        Intake/Output Summary (Last 24 hours) at 08/09/2020 1758 Last data filed at 08/09/2020 1455 Gross per 24 hour  Intake 530 ml  Output 1100 ml  Net -570 ml    Filed Weights   08/07/20 0600 08/08/20 0500 08/09/20 0500  Weight: 53 kg 50.1 kg 48.9 kg   Examination: Physical Exam:  Constitutional: Patient is a thin cachectic chronically ill-appearing Caucasian male currently in no acute distress sitting in chair at bedside awoken from his sleep Eyes: Lids and conjunctivae normal, sclerae anicteric  ENMT: External Ears, Nose appear normal.  Hard of hearing Neck: Appears normal, supple, no cervical masses, normal ROM, no appreciable thyromegaly; no JVD Respiratory:  Diminished to auscultation bilaterally, no wheezing, rales, rhonchi or crackles. Normal respiratory effort and patient is not tachypenic. No accessory muscle use.  Unlabored breathing Cardiovascular: RRR, no murmurs / rubs / gallops. S1 and S2 auscultated.  No appreciable extremity edema Abdomen: Soft, non-tender, non-distended. Bowel sounds positive.  GU: Deferred. Musculoskeletal: No clubbing / cyanosis of digits/nails.  Skin: Left hand skin tear covered with Mepilex no induration; Warm and dry.  Neurologic: CN 2-12 grossly intact with no focal deficits.  Romberg sign cerebellar reflexes not assessed.  Psychiatric: Slightly impaired judgment and insight.  He is somnolent and drowsy and only alert oriented x1. Normal mood and appropriate affect.   Data Reviewed: I have personally reviewed following labs and imaging studies  CBC: Recent Labs  Lab 08/06/20 0825 08/06/20 1347 08/07/20 0328 08/08/20 0326 08/09/20 0917  WBC 10.7* 9.1 10.1 8.8 10.2  NEUTROABS 9.2*  --   --  6.8 8.2*  HGB 8.9* 8.2* 7.7* 8.2* 8.2*  HCT 27.6* 25.7* 24.2* 26.4* 26.0*  MCV 106.6* 108.9* 108.5* 108.6* 109.2*  PLT 153 138* 126* 130* 141*    Basic Metabolic Panel: Recent Labs  Lab 08/06/20 0825 08/06/20 1347 08/07/20 0328 08/08/20 0326 08/09/20 0917  NA 139  --  140 138 138  K 4.1  --  4.2 3.9 4.0  CL 103  --  107 107 103  CO2 31  --  30 27 31   GLUCOSE 112*  --  101* 100* 104*  BUN 16  --  15 13 13   CREATININE 0.84 0.89 0.85 0.73 0.78  CALCIUM 9.7  --  8.7* 8.9 8.8*  MG  --  1.9  --  1.7 2.0  PHOS  --   --   --  2.6 2.2*    GFR: Estimated Creatinine Clearance: 51.8 mL/min (by C-G formula based on SCr of 0.78 mg/dL). Liver Function Tests: Recent Labs  Lab 08/08/20 0326 08/09/20 0917  AST 22 24  ALT 21 23  ALKPHOS 78 84  BILITOT 0.6 0.5  PROT 5.8* 6.0*  ALBUMIN 2.4* 2.5*    No results for input(s): LIPASE, AMYLASE in the last 168 hours. No results for input(s): AMMONIA in the last 168  hours. Coagulation Profile: No results for input(s): INR, PROTIME in the last 168 hours. Cardiac Enzymes: No results for input(s): CKTOTAL, CKMB, CKMBINDEX, TROPONINI in the last 168 hours. BNP (last 3 results) No results for input(s): PROBNP in the last 8760 hours. HbA1C: No results for input(s): HGBA1C in the last 72 hours. CBG: No results for input(s): GLUCAP in the last 168 hours. Lipid Profile: No results for input(s): CHOL, HDL, LDLCALC, TRIG, CHOLHDL, LDLDIRECT in the last 72 hours. Thyroid Function Tests: No results for input(s): TSH, T4TOTAL, FREET4, T3FREE, THYROIDAB in the last 72  hours. Anemia Panel: No results for input(s): VITAMINB12, FOLATE, FERRITIN, TIBC, IRON, RETICCTPCT in the last 72 hours.  Sepsis Labs: No results for input(s): PROCALCITON, LATICACIDVEN in the last 168 hours.  Recent Results (from the past 240 hour(s))  Resp Panel by RT-PCR (Flu A&B, Covid) Nasopharyngeal Swab     Status: None   Collection Time: 08/06/20  8:26 AM   Specimen: Nasopharyngeal Swab; Nasopharyngeal(NP) swabs in vial transport medium  Result Value Ref Range Status   SARS Coronavirus 2 by RT PCR NEGATIVE NEGATIVE Final    Comment: (NOTE) SARS-CoV-2 target nucleic acids are NOT DETECTED.  The SARS-CoV-2 RNA is generally detectable in upper respiratory specimens during the acute phase of infection. The lowest concentration of SARS-CoV-2 viral copies this assay can detect is 138 copies/mL. A negative result does not preclude SARS-Cov-2 infection and should not be used as the sole basis for treatment or other patient management decisions. A negative result may occur with  improper specimen collection/handling, submission of specimen other than nasopharyngeal swab, presence of viral mutation(s) within the areas targeted by this assay, and inadequate number of viral copies(<138 copies/mL). A negative result must be combined with clinical observations, patient history, and  epidemiological information. The expected result is Negative.  Fact Sheet for Patients:  EntrepreneurPulse.com.au  Fact Sheet for Healthcare Providers:  IncredibleEmployment.be  This test is no t yet approved or cleared by the [place] FDA and  has been authorized for detection and/or diagnosis of SARS-CoV-2 by FDA under an Emergency Use Authorization (EUA). This EUA will remain  in effect (meaning this test can be used) for the duration of the COVID-19 declaration under Section 564(b)(1) of the Act, 21 U.S.C.section 360bbb-3(b)(1), unless the authorization is terminated  or revoked sooner.       Influenza A by PCR NEGATIVE NEGATIVE Final   Influenza B by PCR NEGATIVE NEGATIVE Final    Comment: (NOTE) The Xpert Xpress SARS-CoV-2/FLU/RSV plus assay is intended as an aid in the diagnosis of influenza from Nasopharyngeal swab specimens and should not be used as a sole basis for treatment. Nasal washings and aspirates are unacceptable for Xpert Xpress SARS-CoV-2/FLU/RSV testing.  Fact Sheet for Patients: EntrepreneurPulse.com.au  Fact Sheet for Healthcare Providers: IncredibleEmployment.be  This test is not yet approved or cleared by the [place] FDA and has been authorized for detection and/or diagnosis of SARS-CoV-2 by FDA under an Emergency Use Authorization (EUA). This EUA will remain in effect (meaning this test can be used) for the duration of the COVID-19 declaration under Section 564(b)(1) of the Act, 21 U.S.C. section 360bbb-3(b)(1), unless the authorization is terminated or revoked.  Performed at [facility], [address], [place], [place] [address]    RN Pressure Injury Documentation:     Estimated body mass index is 15.92 kg/m as calculated from the following:   Height as of this encounter: 5\' 9"  (1.753 m).   Weight as of this encounter: 48.9  kg.  Malnutrition Type:  Nutrition Problem: Severe Malnutrition Etiology: dysphagia, chronic illness  Malnutrition Characteristics:  Signs/Symptoms: energy intake < or equal to 75% for > or equal to 1 month, severe fat depletion  Nutrition Interventions:  Interventions: Ensure Enlive (each supplement provides 350kcal and 20 grams of protein), Hormel Shake, MVI, Magic cup, Boost Plus   Radiology Studies: DG Swallowing Func-Speech Pathology  Result Date: 08/08/2020 Formatting of this result is different from the original. Objective Swallowing Evaluation: Type of Study: MBS-Modified Barium Swallow Study  Patient Details Name:  Munachimso Palin MRN: [id number] Date of Birth: [birth year] Today's Date: 08/08/2020 Time: SLP Start Time (ACUTE ONLY): 2706 -SLP Stop Time (ACUTE ONLY): 0910 SLP Time Calculation (min) (ACUTE ONLY): 35 min Past Medical History: Past Medical History: Diagnosis Date  Anemia   Cancer ([place])   Constipation   Coronary artery disease   Edema   Gait abnormality   GERD (gastroesophageal reflux disease)   Gout   High cholesterol   Hypertension   Leg weakness   Memory loss   Myocardial infarction ([place])   Obstructive sleep apnea   Osteoporosis   Premature heartbeats   Pulmonary fibrosis (HCC)   Spinal stenosis of lumbar region   UTI (urinary tract infection)   Vertigo   Vitamin D deficiency  Past Surgical History: Past Surgical History: Procedure Laterality Date  CORONARY ARTERY BYPASS GRAFT    JOINT REPLACEMENT Bilateral  HPI: Patient is a 80 yo male adm to [facility] after fall at his daughter's home.  Found to have hip fx but no surgical intervention warranted. 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 Pt reportedly has occasional nasal regurgitation and coughing with po intake.  He saw ENT with plans for swallow study but this was not completed  as patient now admitted to hospital after a fall. Per ENT's note, pt has acquired velopharyngeal incompetence and no recurrence of lesions.  Weight loss reported prior to pt moving to [place] with his daughter.  He appears to be frail and deconditioned.  Subjective: pt awake in chair Assessment / Plan / Recommendation CHL IP CLINICAL IMPRESSIONS 08/08/2020 Clinical Impression Patient presents with mild oral and moderately severe pharyngeal dysphagia. Pt's dysphagia likely due to combination of anatomical anamolies including shortened palate, posterior lingual resection on right for cancer and deconditioning causing exacerbation.   Dysphagia evidenced by impaired velopharyngeal closure, compromise tongue base retraction resulting in decreased epiglottic deflection and impaired airway protection. In addition patient with decreased pharyngeal motility and hyo- laryngeal elevation allowing penetration and aspiration and also pharyngeal retention. Aspiration and penetration occur before , During, and after the swallow due to the above deficits. Patient did not sense mild aspiration. Subtle throat clear noted with mild aspiration. He also did not consistently since pharyngeal retention but dry swallows and cough/hawk with expectoration facilitated pharyngeal clearance. Placing head of bed at 20 allowed residual to remain in posterior pharynx decreasing amount of penetration and aspiration. Pharyngeal retention was much worse with solids and pures. SLP suspects this patient's dysphagia is at his baseline, given report of swallow ability during MBS being normal for him. Advised patient and daughter [name] to swallow precautions to mitigate aspiration and help maximize nutrition. Concerns for adequacy of nutrition present given level of dysphagia however anticipate patient would be able to consume adequate amounts of liquid nutrition. Reiterated that patient's tolerance of aspiration May be more compromised at this point given  decreased mobility. Recommend very strict swallow precautions and compensation strategies. Doubtful for swallowing improvement given suspected chronicity of dysphagia. SLP Visit Diagnosis Dysphagia, pharyngoesophageal phase (R13.14);Dysphagia, unspecified (R13.10);Dysphagia, oropharyngeal phase (R13.12) Attention and concentration deficit following -- Frontal lobe and executive function deficit following -- Impact on safety and function Moderate aspiration risk;Risk for inadequate nutrition/hydration   CHL IP TREATMENT RECOMMENDATION 08/08/2020 Treatment Recommendations Therapy as outlined in treatment plan below   Prognosis 08/08/2020 Prognosis for Safe Diet Advancement Fair Barriers to Reach  Goals Time post onset;Behavior Barriers/Prognosis Comment -- CHL IP DIET RECOMMENDATION 08/08/2020 SLP Diet Recommendations Full liquids only, nectar thickened with meals, thin ok between meals without other intake Liquid Administration via Spoon;Straw Medication Administration Other (Comment) Compensations Slow rate;Small sips/bites;Other (Comment);Hard cough after swallow;Multiple dry swallows after each bite/sip;Effortful swallow Postural Changes Other (Comment)   CHL IP OTHER RECOMMENDATIONS 08/08/2020 Recommended Consults -- Oral Care Recommendations Oral care BID Other Recommendations Have oral suction available   CHL IP FOLLOW UP RECOMMENDATIONS 08/08/2020 Follow up Recommendations Skilled Nursing facility   [facility] IP FREQUENCY AND DURATION 08/08/2020 Speech Therapy Frequency (ACUTE ONLY) min 2x/week Treatment Duration 1 week      CHL IP ORAL PHASE 08/08/2020 Oral Phase Impaired Oral - Pudding Teaspoon -- Oral - Pudding Cup -- Oral - Honey Teaspoon -- Oral - Honey Cup -- Oral - Nectar Teaspoon Decreased velopharyngeal closure Oral - Nectar Cup -- Oral - Nectar Straw Decreased bolus cohesion;Decreased velopharyngeal closure Oral - Thin Teaspoon Decreased velopharyngeal closure Oral - Thin Cup -- Oral - Thin Straw Decreased  velopharyngeal closure Oral - Puree Decreased velopharyngeal closure;Lingual pumping;Weak lingual manipulation;Piecemeal swallowing Oral - Mech Soft Decreased velopharyngeal closure;Impaired mastication;Weak lingual manipulation;Lingual pumping;Piecemeal swallowing;Decreased bolus cohesion;Delayed oral transit Oral - Regular -- Oral - Multi-Consistency -- Oral - Pill -- Oral Phase - Comment --  CHL IP PHARYNGEAL PHASE 08/08/2020 Pharyngeal Phase Impaired Pharyngeal- Pudding Teaspoon -- Pharyngeal -- Pharyngeal- Pudding Cup -- Pharyngeal -- Pharyngeal- Honey Teaspoon -- Pharyngeal -- Pharyngeal- Honey Cup -- Pharyngeal -- Pharyngeal- Nectar Teaspoon Reduced pharyngeal peristalsis;Reduced epiglottic inversion;Reduced anterior laryngeal mobility;Reduced laryngeal elevation;Reduced airway/laryngeal closure;Reduced tongue base retraction;Penetration/Apiration after swallow;Pharyngeal residue - valleculae;Pharyngeal residue - pyriform;Pharyngeal residue - posterior pharnyx;Trace aspiration;Pharyngeal residue - cp segment Pharyngeal Material enters airway, passes BELOW cords without attempt by patient to eject out (silent aspiration) Pharyngeal- Nectar Cup -- Pharyngeal -- Pharyngeal- Nectar Straw Reduced pharyngeal peristalsis;Reduced epiglottic inversion;Reduced anterior laryngeal mobility;Reduced laryngeal elevation;Reduced airway/laryngeal closure;Reduced tongue base retraction;Penetration/Apiration after swallow;Trace aspiration;Pharyngeal residue - valleculae;Pharyngeal residue - pyriform;Pharyngeal residue - posterior pharnyx;Pharyngeal residue - cp segment;Nasopharyngeal reflux Pharyngeal Material enters airway, passes BELOW cords without attempt by patient to eject out (silent aspiration) Pharyngeal- Thin Teaspoon Penetration/Aspiration before swallow;Reduced pharyngeal peristalsis;Reduced epiglottic inversion;Reduced anterior laryngeal mobility;Reduced laryngeal elevation;Reduced airway/laryngeal closure;Reduced  tongue base retraction;Pharyngeal residue - valleculae;Pharyngeal residue - pyriform;Pharyngeal residue - posterior pharnyx;Pharyngeal residue - cp segment Pharyngeal Material enters airway, passes BELOW cords without attempt by patient to eject out (silent aspiration) Pharyngeal- Thin Cup -- Pharyngeal -- Pharyngeal- Thin Straw Reduced pharyngeal peristalsis;Reduced epiglottic inversion;Reduced anterior laryngeal mobility;Reduced laryngeal elevation;Reduced airway/laryngeal closure;Reduced tongue base retraction;Pharyngeal residue - valleculae;Pharyngeal residue - pyriform;Pharyngeal residue - posterior pharnyx;Pharyngeal residue - cp segment;Trace aspiration;Penetration/Aspiration during swallow;Penetration/Apiration after swallow;Penetration/Aspiration before swallow Pharyngeal Material enters airway, passes BELOW cords without attempt by patient to eject out (silent aspiration);Material enters airway, passes BELOW cords and not ejected out despite cough attempt by patient Pharyngeal- Puree Reduced pharyngeal peristalsis;Reduced epiglottic inversion;Reduced anterior laryngeal mobility;Reduced laryngeal elevation;Reduced airway/laryngeal closure;Reduced tongue base retraction;Pharyngeal residue - valleculae;Pharyngeal residue - pyriform;Pharyngeal residue - posterior pharnyx;Pharyngeal residue - cp segment Pharyngeal Material does not enter airway Pharyngeal- Mechanical Soft Reduced pharyngeal peristalsis;Reduced epiglottic inversion;Reduced anterior laryngeal mobility;Reduced laryngeal elevation;Reduced airway/laryngeal closure;Reduced tongue base retraction;Pharyngeal residue - valleculae;Pharyngeal residue - pyriform;Pharyngeal residue - posterior pharnyx;Pharyngeal residue - cp segment Pharyngeal Material does not enter airway Pharyngeal- Regular -- Pharyngeal -- Pharyngeal- Multi-consistency -- Pharyngeal -- Pharyngeal- Pill -- Pharyngeal -- Pharyngeal Comment chin tuck posture worsened swallow as it spilled  pharyngeal retention into open  airway, dry swallows needed with EVERY bolus to help decrease retention - pt largely only transits approx 10% to 20% of boluses into esophagus with initial swallow - cough, "hock" and expectoration also needed to help with clearance  CHL IP CERVICAL ESOPHAGEAL PHASE 08/08/2020 Cervical Esophageal Phase Impaired Pudding Teaspoon -- Pudding Cup -- Honey Teaspoon -- Honey Cup -- Nectar Teaspoon -- Nectar Cup -- Nectar Straw -- Thin Teaspoon -- Thin Cup -- Thin Straw -- Puree -- Mechanical Soft -- Regular -- Multi-consistency -- Pill -- Cervical Esophageal Comment -- Kathleen Lime, MS [facility] SLP Acute Rehab Services Office [phone] Pager [phone] Macario Golds 08/08/2020, 10:37 AM               Scheduled Meds:  aspirin EC  81 mg Oral Daily   atorvastatin  40 mg Oral QPM   enoxaparin (LOVENOX) injection  40 mg Subcutaneous Q24H   famotidine  20 mg Oral BID   feeding supplement  237 mL Oral BID BM   lactose free nutrition  237 mL Oral Q24H   metoprolol tartrate  25 mg Oral BID   polyethylene glycol  17 g Oral BID   senna-docusate  1 tablet Oral BID   tamsulosin  0.8 mg Oral QPM   Continuous Infusions:   LOS: 3 days   Kerney Elbe, DO [facility] PAGER is on AMION  If 7PM-7AM, please contact night-coverage [email or web address]

## 2020-08-09 NOTE — Progress Notes (Signed)
Physical Therapy Treatment Patient Details Name: Adam Owen MRN: [id number] DOB: 1941-01-22 Today's Date: 08/09/2020    History of Present Illness Patient is a 80 year old male with fall at home resulting in nondisplaced fracture of the right greater trochanter, nondisplaced fractures of the right medial and posterior acetabulum and mildly displaced fracture of the right inferior pubic ramus. Pt treated non-operatively. PMH includes previous falls, R hip replacement, hearing loss in left ear, coronary artery disease, constipation, osteoporosis, pulmonary fibrosis, anemia, cancer, memory loss    PT Comments    Pt very cooperative and progressing slowly with mobility but requiring increased time and assist of two for safe performance of all tasks.   Follow Up Recommendations  SNF     Equipment Recommendations  None recommended by PT    Recommendations for Other Services       Precautions / Restrictions Precautions Precautions: Fall Precaution Comments: HOH; better in R ear Restrictions Weight Bearing Restrictions: No RLE Weight Bearing: Weight bearing as tolerated    Mobility  Bed Mobility Overal bed mobility: Needs Assistance Bed Mobility: Supine to Sit     Supine to sit: Min assist;Mod assist;+2 for physical assistance;+2 for safety/equipment     General bed mobility comments: Increased time with assist to manage LEs over EOB, bring trunk to upright and to complete rotation to EOB sitting using bed pad    Transfers Overall transfer level: Needs assistance Equipment used: Rolling walker (2 wheeled) Transfers: Sit to/from [facility] Sit to Stand: Min assist;Mod assist;+2 physical assistance;+2 safety/equipment;From elevated surface Stand pivot transfers: +2 physical assistance;+2 safety/equipment;From elevated surface;Min assist;Mod assist       General transfer comment: cues for LE management and use of UEs to self assist;  Physical assist  to bring wt up and fwd and to balance in standing.  Performed sit,.stand twice at pt request  Ambulation/Gait Ambulation/Gait assistance: Min assist;Mod assist;+2 safety/equipment Gait Distance (Feet): 3 Feet Assistive device: Rolling walker (2 wheeled) Gait Pattern/deviations: Step-to pattern;Decreased step length - right;Decreased step length - left;Shuffle;Trunk flexed Gait velocity: decr   General Gait Details: cues for sequence, posture and position from [employer]             Wheelchair Mobility    Modified Rankin (Stroke Patients Only)       Balance Overall balance assessment: Needs assistance Sitting-balance support: Feet supported Sitting balance-Leahy Scale: Fair   Postural control: Posterior lean Standing balance support: Bilateral upper extremity supported Standing balance-Leahy Scale: Poor                              Cognition Arousal/Alertness: Awake/alert Behavior During Therapy: WFL for tasks assessed/performed Overall Cognitive Status: History of cognitive impairments - at baseline                                 General Comments: patient appropriate with therapy, poor safety awareness trying to sit into chair before backed up completely      Exercises      General Comments        Pertinent Vitals/Pain Pain Assessment: Faces Faces Pain Scale: Hurts little more Pain Location: hip/pelvis area Pain Descriptors / Indicators: Grimacing;Aching Pain Intervention(s): Limited activity within patient's tolerance;Monitored during session;Premedicated before session;Ice applied    Home Living  Prior Function            PT Goals (current goals can now be found in the care plan section) Acute Rehab PT Goals Patient Stated Goal: to rehab PT Goal Formulation: With patient/family Time For Goal Achievement: 08/21/20 Potential to Achieve Goals: Fair Progress towards PT goals: Progressing  toward goals    Frequency    Min 3X/week      PT Plan Current plan remains appropriate    Co-evaluation              AM-PAC PT "6 Clicks" Mobility   Outcome Measure  Help needed turning from your back to your side while in a flat bed without using bedrails?: A Lot Help needed moving from lying on your back to sitting on the side of a flat bed without using bedrails?: A Lot Help needed moving to and from a bed to a chair (including a wheelchair)?: A Lot Help needed standing up from a chair using your arms (e.g., wheelchair or bedside chair)?: A Lot Help needed to walk in hospital room?: A Lot Help needed climbing 3-5 steps with a railing? : Total 6 Click Score: 11    End of Session Equipment Utilized During Treatment: Gait belt Activity Tolerance: Patient tolerated treatment well Patient left: in chair;with call bell/phone within reach;with chair alarm set;with family/visitor present Nurse Communication: Mobility status PT Visit Diagnosis: Unsteadiness on feet (R26.81);Muscle weakness (generalized) (M62.81);History of falling (Z91.81);Difficulty in walking, not elsewhere classified (R26.2);Pain Pain - Right/Left: Right Pain - part of body: Hip     Time: 3779-3968 PT Time Calculation (min) (ACUTE ONLY): 24 min  Charges:  $Gait Training: 8-22 mins $Therapeutic Activity: 8-22 mins                     Adam Owen PT Acute Rehabilitation Services Pager [phone] Office [phone]    Adam Owen 08/09/2020, 2:06 PM

## 2020-08-10 LAB — COMPREHENSIVE METABOLIC PANEL
ALT: 24 U/L (ref 0–44)
AST: 28 U/L (ref 15–41)
Albumin: 2.4 g/dL — ABNORMAL LOW (ref 3.5–5.0)
Alkaline Phosphatase: 84 U/L (ref 38–126)
Anion gap: 3 — ABNORMAL LOW (ref 5–15)
BUN: 16 mg/dL (ref 8–23)
CO2: 30 mmol/L (ref 22–32)
Calcium: 9 mg/dL (ref 8.9–10.3)
Chloride: 107 mmol/L (ref 98–111)
Creatinine, Ser: 0.79 mg/dL (ref 0.61–1.24)
GFR, Estimated: >60 mL/min (ref >60)
Glucose, Bld: 95 mg/dL (ref 70–99)
Potassium: 4.6 mmol/L (ref 3.5–5.1)
Sodium: 140 mmol/L (ref 135–145)
Total Bilirubin: 0.6 mg/dL (ref 0.3–1.2)
Total Protein: 5.9 g/dL — ABNORMAL LOW (ref 6.5–8.1)

## 2020-08-10 LAB — CBC WITH DIFFERENTIAL/PLATELET
Abs Immature Granulocytes: 0.04 10*3/uL (ref 0.00–0.07)
Basophils Absolute: 0 10*3/uL (ref 0.0–0.1)
Basophils Relative: 0 %
Eosinophils Absolute: 0.2 10*3/uL (ref 0.0–0.5)
Eosinophils Relative: 3 %
HCT: 24.6 % — ABNORMAL LOW (ref 39.0–52.0)
Hemoglobin: 8 g/dL — ABNORMAL LOW (ref 13.0–17.0)
Immature Granulocytes: 1 %
Lymphocytes Relative: 10 %
Lymphs Abs: 0.8 10*3/uL (ref 0.7–4.0)
MCH: 35.2 pg — ABNORMAL HIGH (ref 26.0–34.0)
MCHC: 32.5 g/dL (ref 30.0–36.0)
MCV: 108.4 fL — ABNORMAL HIGH (ref 80.0–100.0)
Monocytes Absolute: 0.7 10*3/uL (ref 0.1–1.0)
Monocytes Relative: 8 %
Neutro Abs: 6.9 10*3/uL (ref 1.7–7.7)
Neutrophils Relative %: 78 %
Platelets: 140 10*3/uL — ABNORMAL LOW (ref 150–400)
RBC: 2.27 MIL/uL — ABNORMAL LOW (ref 4.22–5.81)
RDW: 19.6 % — ABNORMAL HIGH (ref 11.5–15.5)
WBC: 8.8 10*3/uL (ref 4.0–10.5)
nRBC: 0 % (ref 0.0–0.2)

## 2020-08-10 LAB — PHOSPHORUS: Phosphorus: 2.6 mg/dL (ref 2.5–4.6)

## 2020-08-10 LAB — URIC ACID: Uric Acid, Serum: 4.3 mg/dL (ref 3.7–8.6)

## 2020-08-10 LAB — MAGNESIUM: Magnesium: 1.9 mg/dL (ref 1.7–2.4)

## 2020-08-10 MED ORDER — COLCHICINE 0.6 MG PO TABS: 0.6000 mg | ORAL_TABLET | Freq: Every day | ORAL | Status: DC | PRN

## 2020-08-10 MED ORDER — COLCHICINE 0.3 MG HALF TABLET
0.3000 mg | ORAL_TABLET | Freq: Every day | ORAL | Status: DC | PRN
  Filled 2020-08-10: qty 1

## 2020-08-10 NOTE — Plan of Care (Signed)
  Problem: Pain Managment: Goal: General experience of comfort will improve Outcome: Progressing   

## 2020-08-10 NOTE — Progress Notes (Signed)
Physical Therapy Treatment Patient Details Name: Adam Owen MRN: [id number] DOB: 1940-10-20 Today's Date: 08/10/2020    History of Present Illness Patient is a 80 year old male with fall at home resulting in nondisplaced fracture of the right greater trochanter, nondisplaced fractures of the right medial and posterior acetabulum and mildly displaced fracture of the right inferior pubic ramus. Pt treated non-operatively. PMH includes previous falls, R hip replacement, hearing loss in left ear, coronary artery disease, constipation, osteoporosis, pulmonary fibrosis, anemia, cancer, memory loss    PT Comments    Pt continues very cooperative and progressing slowly with mobility but requiring significant assist for safe performance of all basic mobility tasks.  Follow Up Recommendations  SNF     Equipment Recommendations  None recommended by PT    Recommendations for Other Services       Precautions / Restrictions Precautions Precautions: Fall Precaution Comments: HOH; better in R ear Restrictions Weight Bearing Restrictions: No RLE Weight Bearing: Weight bearing as tolerated    Mobility  Bed Mobility Overal bed mobility: Needs Assistance Bed Mobility: Sit to Supine     Supine to sit: Min assist;Mod assist;+2 for physical assistance;+2 for safety/equipment Sit to supine: Min assist;Mod assist;+2 for safety/equipment   General bed mobility comments: cues for sequence and use of L LE to self assist.  Physical assist to manage LEs and to control trunk    Transfers Overall transfer level: Needs assistance Equipment used: Rolling walker (2 wheeled) Transfers: Sit to/from [facility] Sit to Stand: Min assist;Mod assist;+2 physical assistance;+2 safety/equipment;From elevated surface Stand pivot transfers: +2 physical assistance;+2 safety/equipment;From elevated surface;Min assist;Mod assist       General transfer comment: cues for LE management and  use of UEs to self assist.  Physical assist to block R knee and to bring wt up and fwd and to balance in initial standing.  Ambulation/Gait Ambulation/Gait assistance: Min assist;Mod assist;+2 safety/equipment Gait Distance (Feet): 5 Feet Assistive device: Rolling walker (2 wheeled) Gait Pattern/deviations: Step-to pattern;Decreased step length - right;Decreased step length - left;Shuffle;Trunk flexed Gait velocity: decr   General Gait Details: Increased time with cues for sequence, posture and position from RW; physical assist for balance/support and to manage RW   Stairs             Wheelchair Mobility    Modified Rankin (Stroke Patients Only)       Balance Overall balance assessment: Needs assistance Sitting-balance support: Feet supported;Bilateral upper extremity supported Sitting balance-Leahy Scale: Poor Sitting balance - Comments: Initially with L and posterior drift but progressed to sitting in neutral position with supervision but with use of UEs to maintain Postural control: Posterior lean Standing balance support: Bilateral upper extremity supported Standing balance-Leahy Scale: Poor                              Cognition Arousal/Alertness: Awake/alert Behavior During Therapy: WFL for tasks assessed/performed Overall Cognitive Status: History of cognitive impairments - at baseline                                        Exercises      General Comments        Pertinent Vitals/Pain Pain Assessment: Faces Faces Pain Scale: Hurts little more Pain Location: hip/pelvis area Pain Descriptors / Indicators: Grimacing;Aching Pain Intervention(s): Limited activity within patient's  tolerance;Monitored during session    Home Living                      Prior Function            PT Goals (current goals can now be found in the care plan section) Acute Rehab PT Goals Patient Stated Goal: to rehab PT Goal Formulation:  With patient/family Time For Goal Achievement: 08/21/20 Potential to Achieve Goals: Fair Progress towards PT goals: Progressing toward goals    Frequency    Min 3X/week      PT Plan Current plan remains appropriate    Co-evaluation              AM-PAC PT "6 Clicks" Mobility   Outcome Measure  Help needed turning from your back to your side while in a flat bed without using bedrails?: A Lot Help needed moving from lying on your back to sitting on the side of a flat bed without using bedrails?: A Lot Help needed moving to and from a bed to a chair (including a wheelchair)?: A Lot Help needed standing up from a chair using your arms (e.g., wheelchair or bedside chair)?: A Lot Help needed to walk in hospital room?: A Lot Help needed climbing 3-5 steps with a railing? : Total 6 Click Score: 11    End of Session Equipment Utilized During Treatment: Gait belt Activity Tolerance: Patient tolerated treatment well Patient left: in bed;with call bell/phone within reach;with bed alarm set Nurse Communication: Mobility status PT Visit Diagnosis: Unsteadiness on feet (R26.81);Muscle weakness (generalized) (M62.81);History of falling (Z91.81);Difficulty in walking, not elsewhere classified (R26.2);Pain Pain - Right/Left: Right Pain - part of body: Hip     Time: 1660-6301 PT Time Calculation (min) (ACUTE ONLY): 18 min  Charges:  $Gait Training: 8-22 mins $Therapeutic Activity: 8-22 mins                     [place] Pager [phone] Office [phone]    Cassaundra Rasch 08/10/2020, 4:02 PM

## 2020-08-10 NOTE — Progress Notes (Signed)
PROGRESS NOTE    Adam Owen  [id number] DOB: [birth year] DOA: 08/06/2020 PCP: Pcp, No   Brief Narrative:  The patient is a 80 year old Caucasian male with a past medical history significant for but not limited to CAD, pulmonary fibrosis, history of memory loss, GERD, gout, hypertension, hyperlipidemia as well as other multiple comorbidities who was brought into the ED by his daughter after a fall.  History is mostly obtained from the daughter over the phone and the ED physician and reportedly patient has been having more frequent falls and due to his social situations has been living with his daughter.  He had an unwitnessed fall yesterday where he landed on his right hip and was unable to get up or bear weight.  Upon arrival to the ED he was hemodynamically stable and initial x-rays were done showed that the right greater trochanter.  Prosthetic fracture was undisplaced right acetabular fracture.  Orthopedic surgery evaluated and opinion that this will be managed nonsurgically and recommended admission to the hospital service.  He underwent SLP evaluation and speech therapist recommending a full liquid diet well hospitalized and his nectar thick with thin okay between meals without other intake.  PT OT evaluating and recommending SNF and social worker in the process of trying to secure a bed as he is out of state insurance.  He has been faxed out to the facilities in [place].  Assessment & Plan:   Active Problems:   Closed right hip fracture (HCC)   Protein-calorie malnutrition, severe  Acute nondisplaced fracture of the right greater trochanter/fracture of the right medial and posterior acetabulum/moderate displaced fracture of the right inferior pubic rami -DG Pelvis and Femur showed "Changes consistent with right greater trochanter periprosthetic fracture.   Lucency in the medial aspect of the right acetabulum suspicious for undisplaced fracture" and "Findings suggestive of right  greater trochanter periprosthetic fracture. Findings consistent with undisplaced right  acetabular fracture. CT may be helpful for further evaluation." -CT Hip Right w/o Contrast showed "Acute nondisplaced fracture of the right greater trochanter. Acute nondisplaced fractures of the right medial and posterior acetabulum. Acute mildly displaced fracture of the  right inferior pubic ramus. Prior right total hip arthroplasty without evidence of hardware complication." -Orthopedic Sugery has been consulted and recommending nonsurgical management -Initially had him nonweightbearing but now they have recommended weightbearing as tolerated and will monitor serial x-rays -They are recommending PT OT evaluation and disposition pending the PT evaluation and they are recommending [facility]; social worker is in the process of assisting SNF placement; he has been faxed out to [place] given his out-of-state insurance -Continue with pain control with hydrocodone-acetaminophen 1-2 tabs every 4 hours as needed for moderate pain -We will discontinue normal saline at 75 MLS per hour; Tolerating meals well  -Patient is VTE prophylaxis with enoxaparin 40 mg subcu every 24  Dysphagia -SLP consulted and MBS done.  Recommending full liquid diet with nectar thick liquids with thins in between  Hyperlipidemia -C/w Atorvastain 40 mg po qEvening   Hypertension -C/w Metoprolol Tartrate 25 mg po BID -Continue to Monitor BP per protocol -Last BP reading was 121/61  Constipation -Refused bowel regimen yesterday but will start today -Will start Senna-Docusate 1 tab po BID, Miralax 17 grams po BID and Bisacodyl Suppository 10 mg RC -Still no bowel movement yet  BPH -C/w Tamsulosin 0.8 mg po Daily   Macrocytic Anemia -Patient's MCV was 108.5 and his hemoglobin/hematocrit went from 8.9/27.6 -> 8.2/25.7 -> 7.7/24.2 -> 8.2/26.4 ->  8.2/26.0 -> 8.0/24.6 with an MCV of 108.4 -Check anemia panel in a.m. -Continue to  monitor for signs and symptoms of bleeding; currently no overt bleeding noted next-repeat CBC in a.m.  Thrombocytopenia -Patient's platelet count went from 153 -> 138 -> 126 -> 130 -> 141 -> 140 -continue monitor for signs and symptoms of bleeding -Repeat CBC in a.m.  Leukocytosis -Likely reactive in the setting of his fall -Patient's WBC is trended down a lot from 10.7 -> 9.1 -> 10.1 -> 8.8 -> 10.2 -> 8.8 -Continue to Monitor and Trend   Hx of Gout -Continue to Monitor -Check Uric Acid Level  -If worsens will start on Colchicine 0.6 mg po Daily (Previously taken)  Left Hand Wound and Skin Tear -Patient has a full-thickness skin tear which occurred when he hit his hand.  His skin is very thin and fragile with several bruised areas from the location.  The nursing staff has applied 3 Steri-Strips and Xeroform gauze to promote healing and recommending topical treatment orders continue the Xeroform gauze to the left hand every day and leave the Steri-Strips in place until they fall off and covering with foam dressings for 3 days  Severe protein calorie malnutrition in the context of Chronic illness/ Underweight -Nutritionist was consulted for further evaluation and assistance -Estimated body mass index is 15.92 kg/m as calculated from the following:   Height as of this encounter: 5\' 9"  (1.753 m).   Weight as of this encounter: 48.9 kg. -They are recommending Ensure Enlive p.o. twice daily, boost plus chocolate, vital accusing daily and if his swallowing is unable to improve they are recommending placing a feeding tube for nutritional support -The speech therapist is recommending patient consuming liquids via teaspoon for the day and medications crushed with Ensure and they are completing an MBS in the next day   DVT prophylaxis: Enoxaparin 40 mg sq q24h Code Status: FULL CODE  Family Communication: Discussed with the daughter at bedside  Disposition Plan: Pending further clinical  improvement. Likley to go to SNF when bed is available  Status is: Inpatient  Remains inpatient appropriate because:Unsafe d/c plan, IV treatments appropriate due to intensity of illness or inability to take PO, and Inpatient level of care appropriate due to severity of illness  Dispo: The patient is from: Home              Anticipated d/c is to: SNF              Patient currently is not medically stable to d/c.   Difficult to place patient No  Consultants:  Orthopedic Surgery   Procedures: None   Antimicrobials:  Anti-infectives (From admission, onward)    None        Subjective: Seen and examined at bedside and he was a little confused but knew his name and birthday and that he was in the hospital because of a fall. Could not answer any other orientation questions. Wanting to mover around in the hall in a wheelchair. No CP or SOB. No Pain noted. No other concerns or complaints at this time.  Objective: Vitals:   08/09/20 1651 08/09/20 2225 08/09/20 2243 08/10/20 0703  BP: (!) 127/56  (!) 106/50 121/61  Pulse: (!) 56 64 (!) 59 74  Resp:   16 16  Temp:   98.4 F (36.9 C) 98 F (36.7 C)  TempSrc:   Oral Oral  SpO2: 92%  97%   Weight:      Height:  Intake/Output Summary (Last 24 hours) at 08/10/2020 1333 Last data filed at 08/10/2020 1100 Gross per 24 hour  Intake 600 ml  Output 1300 ml  Net -700 ml    Filed Weights   08/07/20 0600 08/08/20 0500 08/09/20 0500  Weight: 53 kg 50.1 kg 48.9 kg   Examination: Physical Exam:  Constitutional: The patient is a thin cachectic chronically ill-appearing Caucasian male in NAD appears calm and in NAD Eyes: Has some mild Ptosis. Sclerae anicteric.  ENMT: External Ears, Nose appear normal. Grossly normal hearing.  Neck: Appears normal, supple, no cervical masses, normal ROM, no appreciable thyromegaly; no JVD Respiratory: Diminished to auscultation bilaterally with coarse breath sounds, no wheezing, rales, rhonchi or  crackles. Normal respiratory effort and patient is not tachypenic. No accessory muscle use. Unlabored breathing  Cardiovascular: RRR, no murmurs / rubs / gallops. S1 and S2 auscultated. No extremity edema. Abdomen: Soft, non-tender, non-distended. Bowel sounds positive.  GU: Deferred. Musculoskeletal: No clubbing / cyanosis of digits/nails.  Skin: Left Hand wrapped in Mepilex. No induration; Warm and dry.  Neurologic: CN 2-12 grossly intact with no focal deficits. Romberg sign and cerebellar reflexes not assessed.  Psychiatric: Slightly impaired judgment and insight. Alert and oriented x 2. Normal mood and appropriate affect.   Data Reviewed: I have personally reviewed following labs and imaging studies  CBC: Recent Labs  Lab 08/06/20 0825 08/06/20 1347 08/07/20 0328 08/08/20 0326 08/09/20 0917 08/10/20 0415  WBC 10.7* 9.1 10.1 8.8 10.2 8.8  NEUTROABS 9.2*  --   --  6.8 8.2* 6.9  HGB 8.9* 8.2* 7.7* 8.2* 8.2* 8.0*  HCT 27.6* 25.7* 24.2* 26.4* 26.0* 24.6*  MCV 106.6* 108.9* 108.5* 108.6* 109.2* 108.4*  PLT 153 138* 126* 130* 141* 140*    Basic Metabolic Panel: Recent Labs  Lab 08/06/20 0825 08/06/20 1347 08/07/20 0328 08/08/20 0326 08/09/20 0917 08/10/20 0415  NA 139  --  140 138 138 140  K 4.1  --  4.2 3.9 4.0 4.6  CL 103  --  107 107 103 107  CO2 31  --  30 27 31 30   GLUCOSE 112*  --  101* 100* 104* 95  BUN 16  --  15 13 13 16   CREATININE 0.84 0.89 0.85 0.73 0.78 0.79  CALCIUM 9.7  --  8.7* 8.9 8.8* 9.0  MG  --  1.9  --  1.7 2.0 1.9  PHOS  --   --   --  2.6 2.2* 2.6    GFR: Estimated Creatinine Clearance: 51.8 mL/min (by C-G formula based on SCr of 0.79 mg/dL). Liver Function Tests: Recent Labs  Lab 08/08/20 0326 08/09/20 0917 08/10/20 0415  AST 22 24 28   ALT 21 23 24   ALKPHOS 78 84 84  BILITOT 0.6 0.5 0.6  PROT 5.8* 6.0* 5.9*  ALBUMIN 2.4* 2.5* 2.4*    No results for input(s): LIPASE, AMYLASE in the last 168 hours. No results for input(s): AMMONIA in  the last 168 hours. Coagulation Profile: No results for input(s): INR, PROTIME in the last 168 hours. Cardiac Enzymes: No results for input(s): CKTOTAL, CKMB, CKMBINDEX, TROPONINI in the last 168 hours. BNP (last 3 results) No results for input(s): PROBNP in the last 8760 hours. HbA1C: No results for input(s): HGBA1C in the last 72 hours. CBG: No results for input(s): GLUCAP in the last 168 hours. Lipid Profile: No results for input(s): CHOL, HDL, LDLCALC, TRIG, CHOLHDL, LDLDIRECT in the last 72 hours. Thyroid Function Tests: No results for input(s): TSH,  T4TOTAL, FREET4, T3FREE, THYROIDAB in the last 72 hours. Anemia Panel: No results for input(s): VITAMINB12, FOLATE, FERRITIN, TIBC, IRON, RETICCTPCT in the last 72 hours.  Sepsis Labs: No results for input(s): PROCALCITON, LATICACIDVEN in the last 168 hours.  Recent Results (from the past 240 hour(s))  Resp Panel by RT-PCR (Flu A&B, Covid) Nasopharyngeal Swab     Status: None   Collection Time: 08/06/20  8:26 AM   Specimen: Nasopharyngeal Swab; Nasopharyngeal(NP) swabs in vial transport medium  Result Value Ref Range Status   SARS Coronavirus 2 by RT PCR NEGATIVE NEGATIVE Final    Comment: (NOTE) SARS-CoV-2 target nucleic acids are NOT DETECTED.  The SARS-CoV-2 RNA is generally detectable in upper respiratory specimens during the acute phase of infection. The lowest concentration of SARS-CoV-2 viral copies this assay can detect is 138 copies/mL. A negative result does not preclude SARS-Cov-2 infection and should not be used as the sole basis for treatment or other patient management decisions. A negative result may occur with  improper specimen collection/handling, submission of specimen other than nasopharyngeal swab, presence of viral mutation(s) within the areas targeted by this assay, and inadequate number of viral copies(<138 copies/mL). A negative result must be combined with clinical observations, patient history, and  epidemiological information. The expected result is Negative.  Fact Sheet for Patients:  EntrepreneurPulse.com.au  Fact Sheet for Healthcare Providers:  IncredibleEmployment.be  This test is no t yet approved or cleared by the [place] FDA and  has been authorized for detection and/or diagnosis of SARS-CoV-2 by FDA under an Emergency Use Authorization (EUA). This EUA will remain  in effect (meaning this test can be used) for the duration of the COVID-19 declaration under Section 564(b)(1) of the Act, 21 U.S.C.section 360bbb-3(b)(1), unless the authorization is terminated  or revoked sooner.       Influenza A by PCR NEGATIVE NEGATIVE Final   Influenza B by PCR NEGATIVE NEGATIVE Final    Comment: (NOTE) The Xpert Xpress SARS-CoV-2/FLU/RSV plus assay is intended as an aid in the diagnosis of influenza from Nasopharyngeal swab specimens and should not be used as a sole basis for treatment. Nasal washings and aspirates are unacceptable for Xpert Xpress SARS-CoV-2/FLU/RSV testing.  Fact Sheet for Patients: EntrepreneurPulse.com.au  Fact Sheet for Healthcare Providers: IncredibleEmployment.be  This test is not yet approved or cleared by the [place] FDA and has been authorized for detection and/or diagnosis of SARS-CoV-2 by FDA under an Emergency Use Authorization (EUA). This EUA will remain in effect (meaning this test can be used) for the duration of the COVID-19 declaration under Section 564(b)(1) of the Act, 21 U.S.C. section 360bbb-3(b)(1), unless the authorization is terminated or revoked.  Performed at [facility], [address] [place], [place] [address]    RN Pressure Injury Documentation:     Estimated body mass index is 15.92 kg/m as calculated from the following:   Height as of this encounter: 5\' 9"  (1.753 m).   Weight as of this encounter: 48.9  kg.  Malnutrition Type:  Nutrition Problem: Severe Malnutrition Etiology: dysphagia, chronic illness  Malnutrition Characteristics:  Signs/Symptoms: energy intake < or equal to 75% for > or equal to 1 month, severe fat depletion  Nutrition Interventions:  Interventions: Ensure Enlive (each supplement provides 350kcal and 20 grams of protein), Hormel Shake, MVI, Magic cup, Boost Plus   Radiology Studies: No results found.  Scheduled Meds:  aspirin EC  81 mg Oral Daily   atorvastatin  40 mg Oral QPM  enoxaparin (LOVENOX) injection  40 mg Subcutaneous Q24H   famotidine  20 mg Oral BID   feeding supplement  237 mL Oral BID BM   lactose free nutrition  237 mL Oral Q24H   metoprolol tartrate  25 mg Oral BID   polyethylene glycol  17 g Oral BID   senna-docusate  1 tablet Oral BID   tamsulosin  0.8 mg Oral QPM   Continuous Infusions:   LOS: 4 days   Kerney Elbe, DO [facility] PAGER is on AMION  If 7PM-7AM, please contact night-coverage [email or web address]

## 2020-08-10 NOTE — Plan of Care (Signed)
  Problem: Activity: Goal: Risk for activity intolerance will decrease Outcome: Progressing   

## 2020-08-10 NOTE — Progress Notes (Signed)
Physical Therapy Treatment Patient Details Name: Adam Owen MRN: [id number] DOB: 04/13/1940 Today's Date: 08/10/2020    History of Present Illness Patient is a 80 year old male with fall at home resulting in nondisplaced fracture of the right greater trochanter, nondisplaced fractures of the right medial and posterior acetabulum and mildly displaced fracture of the right inferior pubic ramus. Pt treated non-operatively. PMH includes previous falls, R hip replacement, hearing loss in left ear, coronary artery disease, constipation, osteoporosis, pulmonary fibrosis, anemia, cancer, memory loss    PT Comments    Pt continues very cooperative but apprehensive.  Pt with noted increased retropulsion with initial sitting and standing but improved with time in position and pt able to ambulate into hall with increased time and step-by-step cues.  Pt up in highback wc to allow dtr to wheel pt around halls.   Follow Up Recommendations  SNF     Equipment Recommendations  None recommended by PT    Recommendations for Other Services       Precautions / Restrictions Precautions Precautions: Fall Precaution Comments: HOH; better in R ear Restrictions Weight Bearing Restrictions: No RLE Weight Bearing: Weight bearing as tolerated    Mobility  Bed Mobility Overal bed mobility: Needs Assistance Bed Mobility: Supine to Sit     Supine to sit: Min assist;Mod assist;+2 for physical assistance;+2 for safety/equipment     General bed mobility comments: Increased time with assist to manage LEs over EOB, bring trunk to upright and to complete rotation to EOB sitting using bed pad    Transfers Overall transfer level: Needs assistance Equipment used: Rolling walker (2 wheeled) Transfers: Sit to/from [facility] Sit to Stand: Min assist;Mod assist;+2 physical assistance;+2 safety/equipment;From elevated surface Stand pivot transfers: +2 physical assistance;+2  safety/equipment;From elevated surface;Min assist;Mod assist       General transfer comment: cues for LE management and use of UEs to self assist.  Physical assist to block R knee and to bring wt up and fwd and to balance in initial standing.  Ambulation/Gait Ambulation/Gait assistance: Min assist;Mod assist;+2 safety/equipment Gait Distance (Feet): 12 Feet Assistive device: Rolling walker (2 wheeled) Gait Pattern/deviations: Step-to pattern;Decreased step length - right;Decreased step length - left;Shuffle;Trunk flexed Gait velocity: decr   General Gait Details: Increased time with cues for sequence, posture and position from RW; physical assist for balance/support and to manage RW   Stairs             Wheelchair Mobility    Modified Rankin (Stroke Patients Only)       Balance Overall balance assessment: Needs assistance Sitting-balance support: Feet supported;Bilateral upper extremity supported Sitting balance-Leahy Scale: Poor Sitting balance - Comments: Initially with L and posterior drift but progressed to sitting in neutral position with supervision but with use of UEs to maintain Postural control: Posterior lean Standing balance support: Bilateral upper extremity supported Standing balance-Leahy Scale: Poor                              Cognition Arousal/Alertness: Awake/alert Behavior During Therapy: WFL for tasks assessed/performed Overall Cognitive Status: History of cognitive impairments - at baseline                                        Exercises      General Comments  Pertinent Vitals/Pain Pain Assessment: Faces Faces Pain Scale: Hurts little more Pain Location: hip/pelvis area Pain Descriptors / Indicators: Grimacing;Aching Pain Intervention(s): Limited activity within patient's tolerance;Monitored during session;Premedicated before session    Home Living                      Prior Function             PT Goals (current goals can now be found in the care plan section) Acute Rehab PT Goals Patient Stated Goal: to rehab PT Goal Formulation: With patient/family Time For Goal Achievement: 08/21/20 Potential to Achieve Goals: Fair Progress towards PT goals: Progressing toward goals    Frequency    Min 3X/week      PT Plan Current plan remains appropriate    Co-evaluation              AM-PAC PT "6 Clicks" Mobility   Outcome Measure  Help needed turning from your back to your side while in a flat bed without using bedrails?: A Lot Help needed moving from lying on your back to sitting on the side of a flat bed without using bedrails?: A Lot Help needed moving to and from a bed to a chair (including a wheelchair)?: A Lot Help needed standing up from a chair using your arms (e.g., wheelchair or bedside chair)?: A Lot Help needed to walk in hospital room?: A Lot Help needed climbing 3-5 steps with a railing? : Total 6 Click Score: 11    End of Session Equipment Utilized During Treatment: Gait belt Activity Tolerance: Patient tolerated treatment well Patient left: in chair;with call bell/phone within reach;with chair alarm set;with family/visitor present Nurse Communication: Mobility status PT Visit Diagnosis: Unsteadiness on feet (R26.81);Muscle weakness (generalized) (M62.81);History of falling (Z91.81);Difficulty in walking, not elsewhere classified (R26.2);Pain Pain - Right/Left: Right Pain - part of body: Hip     Time: 1225-1300 PT Time Calculation (min) (ACUTE ONLY): 35 min  Charges:  $Gait Training: 8-22 mins $Therapeutic Activity: 8-22 mins                     Debe Coder PT Acute Rehabilitation Services Pager [phone] Office [phone]    Adam Owen 08/10/2020, 2:43 PM

## 2020-08-11 ENCOUNTER — Encounter (HOSPITAL_COMMUNITY): Payer: Self-pay | Admitting: Family Medicine

## 2020-08-11 LAB — COMPREHENSIVE METABOLIC PANEL
ALT: 22 U/L (ref 0–44)
AST: 20 U/L (ref 15–41)
Albumin: 2.5 g/dL — ABNORMAL LOW (ref 3.5–5.0)
Alkaline Phosphatase: 85 U/L (ref 38–126)
Anion gap: 4 — ABNORMAL LOW (ref 5–15)
BUN: 22 mg/dL (ref 8–23)
CO2: 31 mmol/L (ref 22–32)
Calcium: 9.3 mg/dL (ref 8.9–10.3)
Chloride: 103 mmol/L (ref 98–111)
Creatinine, Ser: 0.83 mg/dL (ref 0.61–1.24)
GFR, Estimated: >60 mL/min (ref >60)
Glucose, Bld: 101 mg/dL — ABNORMAL HIGH (ref 70–99)
Potassium: 4.8 mmol/L (ref 3.5–5.1)
Sodium: 138 mmol/L (ref 135–145)
Total Bilirubin: 0.4 mg/dL (ref 0.3–1.2)
Total Protein: 6.2 g/dL — ABNORMAL LOW (ref 6.5–8.1)

## 2020-08-11 LAB — CBC WITH DIFFERENTIAL/PLATELET
Abs Immature Granulocytes: 0.07 10*3/uL (ref 0.00–0.07)
Basophils Absolute: 0 10*3/uL (ref 0.0–0.1)
Basophils Relative: 0 %
Eosinophils Absolute: 0.2 10*3/uL (ref 0.0–0.5)
Eosinophils Relative: 2 %
HCT: 25 % — ABNORMAL LOW (ref 39.0–52.0)
Hemoglobin: 8.1 g/dL — ABNORMAL LOW (ref 13.0–17.0)
Immature Granulocytes: 1 %
Lymphocytes Relative: 11 %
Lymphs Abs: 1.1 10*3/uL (ref 0.7–4.0)
MCH: 35.1 pg — ABNORMAL HIGH (ref 26.0–34.0)
MCHC: 32.4 g/dL (ref 30.0–36.0)
MCV: 108.2 fL — ABNORMAL HIGH (ref 80.0–100.0)
Monocytes Absolute: 0.9 10*3/uL (ref 0.1–1.0)
Monocytes Relative: 9 %
Neutro Abs: 7.4 10*3/uL (ref 1.7–7.7)
Neutrophils Relative %: 77 %
Platelets: 151 10*3/uL (ref 150–400)
RBC: 2.31 MIL/uL — ABNORMAL LOW (ref 4.22–5.81)
RDW: 19.5 % — ABNORMAL HIGH (ref 11.5–15.5)
WBC: 9.7 10*3/uL (ref 4.0–10.5)
nRBC: 0 % (ref 0.0–0.2)

## 2020-08-11 LAB — PHOSPHORUS: Phosphorus: 3.5 mg/dL (ref 2.5–4.6)

## 2020-08-11 LAB — MAGNESIUM: Magnesium: 2 mg/dL (ref 1.7–2.4)

## 2020-08-11 NOTE — Progress Notes (Signed)
Physical Therapy Treatment Patient Details Name: Adam Owen MRN: [id number] DOB: 31-May-1940 Today's Date: 08/11/2020    History of Present Illness Patient is a 80 year old male with fall at home resulting in nondisplaced fracture of the right greater trochanter, nondisplaced fractures of the right medial and posterior acetabulum and mildly displaced fracture of the right inferior pubic ramus. Pt treated non-operatively. PMH includes previous falls, R hip replacement, hearing loss in left ear, coronary artery disease, constipation, osteoporosis, pulmonary fibrosis, anemia, cancer, memory loss    PT Comments    Pt performed sit to stand from recliner and ambulated ~2ft in room with MIN-MOD A +2 for balance and RW stability with cues to promote upright posture and increased BOS. Pt was limited with further ambulation distance this session due to reported fatigue and discomfort in R hip. Introduced therapeutic exercise for promotion of LE strength/ROM, pt tolerated well. PT educated pt on importance of therapeutic interventions/mobility. Pt positioned with pillows for protection of skin integrity and comfort at EOS. Pt will benefit from continued skilled physical therapy in order to address listed impairments and maximize functional mobility.    Follow Up Recommendations  SNF     Equipment Recommendations  None recommended by PT    Recommendations for Other Services       Precautions / Restrictions Restrictions Weight Bearing Restrictions: No RLE Weight Bearing: Weight bearing as tolerated    Mobility  Bed Mobility               General bed mobility comments: Pt OOB in recliner upon entry    Transfers Overall transfer level: Needs assistance Equipment used: Rolling walker (2 wheeled) Transfers: Sit to/from Stand Sit to Stand: Min assist;Mod assist;+2 physical assistance;+2 safety/equipment         General transfer comment: MIN-MOD A +2 for safety and power up to  stand with cues for safe hand placement and forward weight shift in prep for sit to stand. Pt demonstrating retropulsion in standing and narrow BOS requiring cues to promote upright posure and increased asssist for balance.  Ambulation/Gait Ambulation/Gait assistance: Min assist;Mod assist;+2 physical assistance;+2 safety/equipment Gait Distance (Feet): 6 Feet Assistive device: Rolling walker (2 wheeled) Gait Pattern/deviations: Step-to pattern;Decreased step length - right;Decreased step length - left;Shuffle;Trunk flexed Gait velocity: decr   General Gait Details: MIN-MOD assist +2 for balance and RW stability with cues for upright posture. Pt demonstrated slightly increased BOS in comparison to standing posture. Pt able to ambulate ~4 steps and maintain standing balance for ~20s with MOD assist +1 for steadying/balance and RW stability before requesting to return to seated position. Pt limited with ambulation distance due to reported fatigue and R hip pain.   Stairs             Wheelchair Mobility    Modified Rankin (Stroke Patients Only)       Balance Overall balance assessment: Needs assistance Sitting-balance support: Feet supported;Bilateral upper extremity supported Sitting balance-Leahy Scale: Poor   Postural control: Posterior lean Standing balance support: Bilateral upper extremity supported Standing balance-Leahy Scale: Poor Standing balance comment: use of external support to maintain standing balance                            Cognition Arousal/Alertness: Awake/alert Behavior During Therapy: WFL for tasks assessed/performed Overall Cognitive Status: History of cognitive impairments - at baseline  General Comments: Pt appropriate during therapy, required intermittent repeated cues for command following.      Exercises General Exercises - Lower Extremity Long Arc Quad: AROM;Both;20  reps;Seated Straight Leg Raises: AROM;Both;5 reps;Seated Hip Flexion/Marching: AROM;Both;20 reps;Seated    General Comments        Pertinent Vitals/Pain Pain Assessment: Faces Faces Pain Scale: Hurts a little bit Pain Location: R hip/pelvis region Pain Descriptors / Indicators: Grimacing;Aching Pain Intervention(s): Limited activity within patient's tolerance;Monitored during session;Repositioned    Home Living                      Prior Function            PT Goals (current goals can now be found in the care plan section) Acute Rehab PT Goals Patient Stated Goal: none stated this session PT Goal Formulation: With patient/family Time For Goal Achievement: 08/21/20 Potential to Achieve Goals: Fair Progress towards PT goals: Progressing toward goals    Frequency    Min 3X/week      PT Plan Current plan remains appropriate    Co-evaluation              AM-PAC PT "6 Clicks" Mobility   Outcome Measure  Help needed turning from your back to your side while in a flat bed without using bedrails?: A Lot Help needed moving from lying on your back to sitting on the side of a flat bed without using bedrails?: A Lot Help needed moving to and from a bed to a chair (including a wheelchair)?: A Lot Help needed standing up from a chair using your arms (e.g., wheelchair or bedside chair)?: A Lot Help needed to walk in hospital room?: A Lot Help needed climbing 3-5 steps with a railing? : Total 6 Click Score: 11    End of Session Equipment Utilized During Treatment: Gait belt Activity Tolerance: Patient tolerated treatment well;Patient limited by fatigue;Patient limited by pain Patient left: in chair;with call bell/phone within reach;with chair alarm set;with family/visitor present Nurse Communication: Mobility status PT Visit Diagnosis: Unsteadiness on feet (R26.81);Muscle weakness (generalized) (M62.81);History of falling (Z91.81);Difficulty in walking, not  elsewhere classified (R26.2);Pain Pain - Right/Left: Right Pain - part of body: Hip     Time: 2956-2130 PT Time Calculation (min) (ACUTE ONLY): 18 min  Charges:  $Therapeutic Activity: 8-22 mins                     Festus Barren PT, DPT  Acute Rehabilitation Services  Office [phone]    08/11/2020, 4:04 PM

## 2020-08-11 NOTE — Progress Notes (Signed)
  Speech Language Pathology Treatment: Dysphagia  Patient Details Name: Adam Owen MRN: [id number] DOB: 1940/12/30 Today's Date: 08/11/2020 Time: 9629-5284 SLP Time Calculation (min) (ACUTE ONLY): 20 min  Assessment / Plan / Recommendation Clinical Impression  Patient seen with daughter present for dysphagia treatment consisting mainly of education of patient and daughter Adam Owen) who was present in room. SLP clarified diet consistencies as there was apparently some miscommunication. Daughter demonstrated a good understanding of patient's swallow strategies, aspiration risk, diet consistencies and she is completely on board with implementing them. Patient consumed a few small sips of water (thin) and exhibited mild instance of delayed throat clearing. SLP to continue to follow for patient and family education.   HPI HPI: Patient is a 80 yo male adm to [facility] after fall at his daughter's home.  Found to have hip fx but no surgical intervention warranted. 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 Pt reportedly has occasional nasal regurgitation and coughing with po intake.  He saw ENT with plans for swallow study but this was not completed as patient now admitted to hospital after a fall. Per ENT's note, pt has acquired velopharyngeal incompetence and no recurrence of lesions.  Weight loss reported prior to pt moving to [place] with his daughter.  He appears to be frail and deconditioned.      SLP Plan  Continue with current plan of care       Recommendations  Diet recommendations: Nectar-thick liquid (full liquids, may have thin liquids in between meals) Liquids provided via: Cup;Straw Medication Administration: Crushed with puree Supervision: Full supervision/cueing for compensatory strategies;Staff to assist with self feeding Compensations:  Slow rate;Small sips/bites;Other (Comment);Hard cough after swallow;Multiple dry swallows after each bite/sip;Effortful swallow Postural Changes and/or Swallow Maneuvers:  (20 degrees HOB)                Oral Care Recommendations: Oral care QID;Staff/trained caregiver to provide oral care Follow up Recommendations: Skilled Nursing facility SLP Visit Diagnosis: Dysphagia, unspecified (R13.10) Plan: Continue with current plan of care       Sonia Baller, MA, CCC-SLP Speech Therapy

## 2020-08-11 NOTE — Progress Notes (Signed)
PROGRESS NOTE    Adam Owen  [id number] DOB: Jun 26, 1940 DOA: 08/06/2020 PCP: Pcp, No   Brief Narrative:  The patient is a 80 year old Caucasian male with a past medical history significant for but not limited to CAD, pulmonary fibrosis, history of memory loss, GERD, gout, hypertension, hyperlipidemia as well as other multiple comorbidities who was brought into the ED by his daughter after a fall.  History is mostly obtained from the daughter over the phone and the ED physician and reportedly patient has been having more frequent falls and due to his social situations has been living with his daughter.  He had an unwitnessed fall yesterday where he landed on his right hip and was unable to get up or bear weight.  Upon arrival to the ED he was hemodynamically stable and initial x-rays were done showed that the right greater trochanter.  Prosthetic fracture was undisplaced right acetabular fracture.  Orthopedic surgery evaluated and opinion that this will be managed nonsurgically and recommended admission to the hospital service.  He underwent SLP evaluation and speech therapist recommending a full liquid diet well hospitalized and his nectar thick with thin okay between meals without other intake.  PT OT evaluating and recommending SNF and social worker in the process of trying to secure a bed as he is out of state insurance.  He has been faxed out to the facilities in [place] and his clinicals are being reviewed by facilities  Assessment & Plan:   Active Problems:   Closed right hip fracture (HCC)   Protein-calorie malnutrition, severe  Acute nondisplaced fracture of the right greater trochanter/fracture of the right medial and posterior acetabulum/moderate displaced fracture of the right inferior pubic rami -DG Pelvis and Femur showed "Changes consistent with right greater trochanter periprosthetic fracture.   Lucency in the medial aspect of the right acetabulum suspicious for  undisplaced fracture" and "Findings suggestive of right greater trochanter periprosthetic fracture. Findings consistent with undisplaced right  acetabular fracture. CT may be helpful for further evaluation." -CT Hip Right w/o Contrast showed "Acute nondisplaced fracture of the right greater trochanter. Acute nondisplaced fractures of the right medial and posterior acetabulum. Acute mildly displaced fracture of the  right inferior pubic ramus. Prior right total hip arthroplasty without evidence of hardware complication." -Orthopedic Sugery has been consulted and recommending nonsurgical management -Initially had him nonweightbearing but now they have recommended weightbearing as tolerated and will monitor serial x-rays -They are recommending PT OT evaluation and disposition pending the PT evaluation and they are recommending [facility] SN[facility]; social worker is in the process of assisting SNF placement; he has been faxed out to [place] given his out-of-state insurance -Continue with pain control with hydrocodone-acetaminophen 1-2 tabs every 4 hours as needed for moderate pain -Discontinued normal saline at 75 MLS per hour; Tolerating meals well  -Patient is VTE prophylaxis with enoxaparin 40 mg subcu every 24  Dysphagia -SLP consulted and MBS done.  Recommending full liquid diet with nectar thick liquids with thins in between  Hyperlipidemia -C/w Atorvastain 40 mg po qEvening   Hypertension -C/w Metoprolol Tartrate 25 mg po BID -Continue to Monitor BP per protocol -Last BP reading was 120/52  Constipation -Started Senna-Docusate 1 tab po BID, Miralax 17 grams po BID and Bisacodyl Suppository 10 mg RC -Still no bowel movement yet  BPH -C/w Tamsulosin 0.8 mg po Daily   Macrocytic Anemia -Patient's hemoglobin/hematocrit went from 8.9/27.6 -> 8.2/25.7 -> 7.7/24.2 -> 8.2/26.4 -> 8.2/26.0 -> 8.0/24.6 -> 8.1/25.0 with an MCV  of 108.2 -Check anemia panel in a.m. -Continue to monitor for signs  and symptoms of bleeding; currently no overt bleeding noted next-repeat CBC in a.m.  Thrombocytopenia -Patient's platelet count went from 153 -> 138 -> 126 -> 130 -> 141 -> 140 -> 151 -continue monitor for signs and symptoms of bleeding -Repeat CBC in a.m.  Leukocytosis -Likely reactive in the setting of his fall -Patient's WBC is trended down a lot from 10.7 -> 9.1 -> 10.1 -> 8.8 -> 10.2 -> 8.8 -> 9.7 -Continue to Monitor and Trend   Hx of Gout -Continue to Monitor -Check Uric Acid Level and was normal at 4.3 -If worsens will start on Colchicine 0.6 mg po Daily (Previously taken)  Left Hand Wound and Skin Tear -Patient has a full-thickness skin tear which occurred when he hit his hand.  His skin is very thin and fragile with several bruised areas from the location.  The nursing staff has applied 3 Steri-Strips and Xeroform gauze to promote healing and recommending topical treatment orders continue the Xeroform gauze to the left hand every day and leave the Steri-Strips in place until they fall off and covering with foam dressings for 3 days  Severe protein calorie malnutrition in the context of Chronic illness/ Underweight -Nutritionist was consulted for further evaluation and assistance -Estimated body mass index is 15.92 kg/m as calculated from the following:   Height as of this encounter: 5\' 9"  (1.753 m).   Weight as of this encounter: 48.9 kg. -They are recommending Ensure Enlive p.o. twice daily, boost plus chocolate, vital accusing daily and if his swallowing is unable to improve they are recommending placing a feeding tube for nutritional support -The speech therapist is recommending patient consuming liquids via teaspoon for the day and medications crushed with Ensure and they are completing an MBS in the next day   DVT prophylaxis: Enoxaparin 40 mg sq q24h Code Status: FULL CODE  Family Communication: Discussed with the youngest daughter at bedside  Disposition Plan:  Pending further clinical improvement. Likley to go to SNF when bed is available  Status is: Inpatient  Remains inpatient appropriate because:Unsafe d/c plan, IV treatments appropriate due to intensity of illness or inability to take PO, and Inpatient level of care appropriate due to severity of illness  Dispo: The patient is from: Home              Anticipated d/c is to: SNF              Patient currently is not medically stable to d/c.   Difficult to place patient No  Consultants:  Orthopedic Surgery   Procedures: None   Antimicrobials:  Anti-infectives (From admission, onward)    None        Subjective: Seen and examined at bedside and in NAD. Appears a little confused still and only alert and oriented x1. No complaints of pain.  No lightheadedness or dizziness.  States that he had a good night.  No other concerns or complaints this time.  Objective: Vitals:   08/10/20 0703 08/10/20 2127 08/10/20 2229 08/11/20 0529  BP: 121/61 (!) 90/45  (!) 120/52  Pulse: 74 60 (!) 58 (!) 54  Resp: 16 16  16   Temp: 98 F (36.7 C) 98.5 F (36.9 C)  98.2 F (36.8 C)  TempSrc: Oral Oral  Axillary  SpO2:  96%  94%  Weight:      Height:        Intake/Output Summary (Last 24 hours)  at 08/11/2020 0845 Last data filed at 08/11/2020 0530 Gross per 24 hour  Intake 300 ml  Output 1200 ml  Net -900 ml    Filed Weights   08/07/20 0600 08/08/20 0500 08/09/20 0500  Weight: 53 kg 50.1 kg 48.9 kg   Examination: Physical Exam:  Constitutional: The patient is a thin cachectic chronically ill-appearing Caucasian male currently in no acute distress Eyes: Lids and conjunctivae normal, sclerae anicteric  ENMT: External Ears, Nose appear normal.  He is hard of hearing slightly Neck: Appears normal, supple, no cervical masses, normal ROM, no appreciable thyromegaly; no JVD Respiratory: Diminished to auscultation bilaterally with coarse breath sounds, no wheezing, rales, rhonchi or crackles.  Normal respiratory effort and patient is not tachypenic. No accessory muscle use.  Unlabored breathing Cardiovascular: RRR, no murmurs / rubs / gallops. S1 and S2 auscultated. No extremity edema.  Abdomen: Soft, non-tender, non-distended. Bowel sounds positive.  GU: Deferred. Musculoskeletal: No clubbing / cyanosis of digits/nails. No joint deformity upper and lower extremities.  Skin: Left hand is wrapped and in a black spot from where he has a skin tear. no induration; Warm and dry.  Neurologic: CN 2-12 grossly intact with no focal deficits. Romberg sign cerebellar reflexes not assessed.  Psychiatric: Impaired judgment and insight. Alert and oriented x 1. Normal mood and appropriate affect.    Data Reviewed: I have personally reviewed following labs and imaging studies  CBC: Recent Labs  Lab 08/06/20 0825 08/06/20 1347 08/07/20 0328 08/08/20 0326 08/09/20 0917 08/10/20 0415 08/11/20 0407  WBC 10.7*   < > 10.1 8.8 10.2 8.8 9.7  NEUTROABS 9.2*  --   --  6.8 8.2* 6.9 7.4  HGB 8.9*   < > 7.7* 8.2* 8.2* 8.0* 8.1*  HCT 27.6*   < > 24.2* 26.4* 26.0* 24.6* 25.0*  MCV 106.6*   < > 108.5* 108.6* 109.2* 108.4* 108.2*  PLT 153   < > 126* 130* 141* 140* 151   < > = values in this interval not displayed.    Basic Metabolic Panel: Recent Labs  Lab 08/06/20 1347 08/07/20 0328 08/08/20 0326 08/09/20 0917 08/10/20 0415 08/11/20 0407  NA  --  140 138 138 140 138  K  --  4.2 3.9 4.0 4.6 4.8  CL  --  107 107 103 107 103  CO2  --  30 27 31 30 31   GLUCOSE  --  101* 100* 104* 95 101*  BUN  --  15 13 13 16 22   CREATININE 0.89 0.85 0.73 0.78 0.79 0.83  CALCIUM  --  8.7* 8.9 8.8* 9.0 9.3  MG 1.9  --  1.7 2.0 1.9 2.0  PHOS  --   --  2.6 2.2* 2.6 3.5    GFR: Estimated Creatinine Clearance: 49.9 mL/min (by C-G formula based on SCr of 0.83 mg/dL). Liver Function Tests: Recent Labs  Lab 08/08/20 0326 08/09/20 0917 08/10/20 0415 08/11/20 0407  AST 22 24 28 20   ALT 21 23 24 22   ALKPHOS  78 84 84 85  BILITOT 0.6 0.5 0.6 0.4  PROT 5.8* 6.0* 5.9* 6.2*  ALBUMIN 2.4* 2.5* 2.4* 2.5*    No results for input(s): LIPASE, AMYLASE in the last 168 hours. No results for input(s): AMMONIA in the last 168 hours. Coagulation Profile: No results for input(s): INR, PROTIME in the last 168 hours. Cardiac Enzymes: No results for input(s): CKTOTAL, CKMB, CKMBINDEX, TROPONINI in the last 168 hours. BNP (last 3 results) No results for input(s): PROBNP  in the last 8760 hours. HbA1C: No results for input(s): HGBA1C in the last 72 hours. CBG: No results for input(s): GLUCAP in the last 168 hours. Lipid Profile: No results for input(s): CHOL, HDL, LDLCALC, TRIG, CHOLHDL, LDLDIRECT in the last 72 hours. Thyroid Function Tests: No results for input(s): TSH, T4TOTAL, FREET4, T3FREE, THYROIDAB in the last 72 hours. Anemia Panel: No results for input(s): VITAMINB12, FOLATE, FERRITIN, TIBC, IRON, RETICCTPCT in the last 72 hours.  Sepsis Labs: No results for input(s): PROCALCITON, LATICACIDVEN in the last 168 hours.  Recent Results (from the past 240 hour(s))  Resp Panel by RT-PCR (Flu A&B, Covid) Nasopharyngeal Swab     Status: None   Collection Time: 08/06/20  8:26 AM   Specimen: Nasopharyngeal Swab; Nasopharyngeal(NP) swabs in vial transport medium  Result Value Ref Range Status   SARS Coronavirus 2 by RT PCR NEGATIVE NEGATIVE Final    Comment: (NOTE) SARS-CoV-2 target nucleic acids are NOT DETECTED.  The SARS-CoV-2 RNA is generally detectable in upper respiratory specimens during the acute phase of infection. The lowest concentration of SARS-CoV-2 viral copies this assay can detect is 138 copies/mL. A negative result does not preclude SARS-Cov-2 infection and should not be used as the sole basis for treatment or other patient management decisions. A negative result may occur with  improper specimen collection/handling, submission of specimen other than nasopharyngeal swab, presence of  viral mutation(s) within the areas targeted by this assay, and inadequate number of viral copies(<138 copies/mL). A negative result must be combined with clinical observations, patient history, and epidemiological information. The expected result is Negative.  Fact Sheet for Patients:  EntrepreneurPulse.com.au  Fact Sheet for Healthcare Providers:  IncredibleEmployment.be  This test is no t yet approved or cleared by the [place] FDA and  has been authorized for detection and/or diagnosis of SARS-CoV-2 by FDA under an Emergency Use Authorization (EUA). This EUA will remain  in effect (meaning this test can be used) for the duration of the COVID-19 declaration under Section 564(b)(1) of the Act, 21 U.S.C.section 360bbb-3(b)(1), unless the authorization is terminated  or revoked sooner.       Influenza A by PCR NEGATIVE NEGATIVE Final   Influenza B by PCR NEGATIVE NEGATIVE Final    Comment: (NOTE) The Xpert Xpress SARS-CoV-2/FLU/RSV plus assay is intended as an aid in the diagnosis of influenza from Nasopharyngeal swab specimens and should not be used as a sole basis for treatment. Nasal washings and aspirates are unacceptable for Xpert Xpress SARS-CoV-2/FLU/RSV testing.  Fact Sheet for Patients: EntrepreneurPulse.com.au  Fact Sheet for Healthcare Providers: IncredibleEmployment.be  This test is not yet approved or cleared by the [place] FDA and has been authorized for detection and/or diagnosis of SARS-CoV-2 by FDA under an Emergency Use Authorization (EUA). This EUA will remain in effect (meaning this test can be used) for the duration of the COVID-19 declaration under Section 564(b)(1) of the Act, 21 U.S.C. section 360bbb-3(b)(1), unless the authorization is terminated or revoked.  Performed at [facility], [address] [place], [place] [address]    RN Pressure  Injury Documentation:     Estimated body mass index is 15.92 kg/m as calculated from the following:   Height as of this encounter: 5\' 9"  (1.753 m).   Weight as of this encounter: 48.9 kg.  Malnutrition Type:  Nutrition Problem: Severe Malnutrition Etiology: dysphagia, chronic illness  Malnutrition Characteristics:  Signs/Symptoms: energy intake < or equal to 75% for > or equal to 1 month, severe  fat depletion  Nutrition Interventions:  Interventions: Ensure Enlive (each supplement provides 350kcal and 20 grams of protein), Hormel Shake, MVI, Magic cup, Boost Plus   Radiology Studies: No results found.  Scheduled Meds:  aspirin EC  81 mg Oral Daily   atorvastatin  40 mg Oral QPM   enoxaparin (LOVENOX) injection  40 mg Subcutaneous Q24H   famotidine  20 mg Oral BID   feeding supplement  237 mL Oral BID BM   lactose free nutrition  237 mL Oral Q24H   metoprolol tartrate  25 mg Oral BID   polyethylene glycol  17 g Oral BID   senna-docusate  1 tablet Oral BID   tamsulosin  0.8 mg Oral QPM   Continuous Infusions:   LOS: 5 days   Kerney Elbe, DO [facility] PAGER is on AMION  If 7PM-7AM, please contact night-coverage [email or web address]

## 2020-08-11 NOTE — Plan of Care (Signed)
  Problem: Pain Managment: Goal: General experience of comfort will improve Outcome: Progressing   

## 2020-08-11 NOTE — Progress Notes (Signed)
  Speech Language Pathology Treatment: Dysphagia  Patient Details Name: Adam Owen MRN: [id number] DOB: [birth year] Today's Date: 08/11/2020 Time: 0350-0938 SLP Time Calculation (min) (ACUTE ONLY): 30 min  Assessment / Plan / Recommendation Clinical Impression  Patient seen for skilled ST treatment focusing on dysphagia with patient's daughter present in room as well. SLP provided education to patient and daughter and showed daughter some of MBS films depicting pharyngeal retention of boluses. At some point, patient's diet order had been changed from full liquids (nectar thick) to regular solids and so patient's breakfast tray did include some solids (pancake). SLP spoke with patient's MD and decision was made to change diet back to full liquids. Patient masticating soft canned peaches and small pieces of pancake and exhibiting significantly prolonged mastication and with need to expectorate most solid PO's. SLP educated patient and daughter regarding plan to continue full liquids nectar thick while patient is hospitalized for safety and to maximize PO intake but that as patient's dysphagia appears to be chronic and not likely to improve, upon discharge from hospital, patient may try some solids as tolerated. SLP did emphasize that patient should focus on liquids for overall nutrition but may try solids for pleasure. Patient and daughter both verbalized understanding.   HPI HPI: Patient is a 80 yo male adm to [facility] after fall at his daughter's home.  Found to have hip fx but no surgical intervention warranted. 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 Pt reportedly has occasional nasal regurgitation and coughing with po intake.  He saw ENT with plans for swallow study but this was not completed as patient now admitted to hospital after a fall. Per  ENT's note, pt has acquired velopharyngeal incompetence and no recurrence of lesions.  Weight loss reported prior to pt moving to [place] with his daughter.  He appears to be frail and deconditioned.      SLP Plan  Continue with current plan of care       Recommendations  Diet recommendations: Other(comment);Nectar-thick liquid (full liquids nectar thick) Liquids provided via: Cup;Straw Medication Administration: Crushed with puree Supervision: Full supervision/cueing for compensatory strategies;Staff to assist with self feeding Compensations: Slow rate;Small sips/bites;Other (Comment);Hard cough after swallow;Multiple dry swallows after each bite/sip;Effortful swallow Postural Changes and/or Swallow Maneuvers:  (upright 20 degrees HOB)                Oral Care Recommendations: Oral care QID;Staff/trained caregiver to provide oral care Follow up Recommendations: Skilled Nursing facility SLP Visit Diagnosis: Dysphagia, unspecified (R13.10) Plan: Continue with current plan of care       Sonia Baller, MA, CCC-SLP Speech Therapy

## 2020-08-11 NOTE — TOC Progression Note (Signed)
Transition of Care [facility]) - Progression Note   Patient Details  Name: Adam Owen MRN: [id number] Date of Birth: 1940-10-25  Transition of Care [facility]) CM/SW [place], LCSW Phone Number: 08/11/2020, 3:39 PM  Clinical Narrative: CSW faxed clinicals to [facility] [phone]) for review. Copy of insurance card faxed to [place] [phone]). CSW spoke with patient's daughter, Adam Owen, who requested that CSW call the patient care coordinator, Adam Owen, with the patient's [facility]. CSW spoke with Adam Owen who sent CSW a list of SNFs to call for rehab bed availability:  [facility]: no beds available [facility]: no bed available [place]: clinicals faxed to [facility] for review [phone]) [place]: will only consider reviewing if family will set up EMS transport in advance to ensure an on time discharge [facility]: Per Adam Owen in admissions, facility does not accept patient's insurance [place]: clinicals faxed to [facility] in admissions for review [facility]: left VM requesting call back [place]: left VM requesting call back [place]: Per Adam Owen, facility does not accepts patient's insurance [place]: clinicals faxed for review [phone]) [place]: left VM requesting call back  CSW also met with patient's daughter, Adam Owen, to discuss transportation to a [facility] SN[facility] and the family is not leaning towards that at this time. Daughter is agreeable to cold calling local SNFs for a single case agreement with the patient's insurance even though the patient has been denied by several local SNFs. Family is agreeable to SNFs in [place], [place] if the family cannot find a local SNF to accept the patient. TOC to follow.  Expected Discharge Plan: [place] Barriers to Discharge: Continued Medical Work up,  SNF Pending bed offer, Ship broker  Expected Discharge Plan and Services Expected Discharge Plan: [place] In-house Referral: Clinical Social Work Post Acute Care Choice: [place] Living arrangements for the past 2 months: Single Family Home             DME Arranged: N/A DME Agency: NA  Readmission Risk Interventions No flowsheet data found.

## 2020-08-12 ENCOUNTER — Telehealth: Payer: Self-pay | Admitting: *Deleted

## 2020-08-12 ENCOUNTER — Inpatient Hospital Stay (HOSPITAL_COMMUNITY): Payer: Medicare (Managed Care)

## 2020-08-12 LAB — COMPREHENSIVE METABOLIC PANEL
ALT: 22 U/L (ref 0–44)
AST: 29 U/L (ref 15–41)
Albumin: 2.6 g/dL — ABNORMAL LOW (ref 3.5–5.0)
Alkaline Phosphatase: 90 U/L (ref 38–126)
Anion gap: 3 — ABNORMAL LOW (ref 5–15)
BUN: 27 mg/dL — ABNORMAL HIGH (ref 8–23)
CO2: 32 mmol/L (ref 22–32)
Calcium: 9.4 mg/dL (ref 8.9–10.3)
Chloride: 102 mmol/L (ref 98–111)
Creatinine, Ser: 0.92 mg/dL (ref 0.61–1.24)
GFR, Estimated: >60 mL/min (ref >60)
Glucose, Bld: 98 mg/dL (ref 70–99)
Potassium: 4.6 mmol/L (ref 3.5–5.1)
Sodium: 137 mmol/L (ref 135–145)
Total Bilirubin: 0.8 mg/dL (ref 0.3–1.2)
Total Protein: 6.4 g/dL — ABNORMAL LOW (ref 6.5–8.1)

## 2020-08-12 LAB — CBC WITH DIFFERENTIAL/PLATELET
Abs Immature Granulocytes: 0.05 10*3/uL (ref 0.00–0.07)
Basophils Absolute: 0 10*3/uL (ref 0.0–0.1)
Basophils Relative: 0 %
Eosinophils Absolute: 0.2 10*3/uL (ref 0.0–0.5)
Eosinophils Relative: 2 %
HCT: 25.4 % — ABNORMAL LOW (ref 39.0–52.0)
Hemoglobin: 8.2 g/dL — ABNORMAL LOW (ref 13.0–17.0)
Immature Granulocytes: 1 %
Lymphocytes Relative: 11 %
Lymphs Abs: 1 10*3/uL (ref 0.7–4.0)
MCH: 34.6 pg — ABNORMAL HIGH (ref 26.0–34.0)
MCHC: 32.3 g/dL (ref 30.0–36.0)
MCV: 107.2 fL — ABNORMAL HIGH (ref 80.0–100.0)
Monocytes Absolute: 0.8 10*3/uL (ref 0.1–1.0)
Monocytes Relative: 9 %
Neutro Abs: 6.8 10*3/uL (ref 1.7–7.7)
Neutrophils Relative %: 77 %
Platelets: 162 10*3/uL (ref 150–400)
RBC: 2.37 MIL/uL — ABNORMAL LOW (ref 4.22–5.81)
RDW: 19.5 % — ABNORMAL HIGH (ref 11.5–15.5)
WBC: 8.8 10*3/uL (ref 4.0–10.5)
nRBC: 0 % (ref 0.0–0.2)

## 2020-08-12 LAB — MAGNESIUM: Magnesium: 2.1 mg/dL (ref 1.7–2.4)

## 2020-08-12 LAB — PHOSPHORUS: Phosphorus: 3.3 mg/dL (ref 2.5–4.6)

## 2020-08-12 NOTE — Progress Notes (Signed)
Updated x-rays reviewed, acetabular fractures appear stable.  The periprosthetic femur fracture also appears stable, the fracture extends a little bit more distal than I appreciated on initial review, however in comparison to the preoperative CAT scan I think that it is still stable and the stem has not shifted position.  I would not make any significant changes at this point from the orthopedic standpoint.  He is weightbearing as tolerated, although I am okay to minimize the strain put on the prosthesis until we get to the 6-week mark.  Marchia Bond, MD

## 2020-08-12 NOTE — TOC Progression Note (Signed)
Transition of Care [facility]) - Progression Note   Patient Details  Name: Adam Owen MRN: [id number] Date of Birth: 1940/11/17  Transition of Care [facility]) CM/SW [place], LCSW Phone Number: 08/12/2020, 2:42 PM  Clinical Narrative: CSW called and left VM for admissions at [facility] SNFs to follow up with referrals. CSW spoke with patient's daughter who has reached out to [facility] and [facility] to consider a single case agreement. CSW received calls from [place] with [facility] and Juliann Pulse with [facility], who are reviewing the patient's case. Clinicals were faxed to [facility] SNF referral line [phone]). TOC awaiting updates from [facility] and SNFs.  Expected Discharge Plan: [place] Barriers to Discharge: Continued Medical Work up, SNF Pending bed offer, Ship broker  Expected Discharge Plan and Services Expected Discharge Plan: [place] In-house Referral: Clinical Social Work Post Acute Care Choice: [place] Living arrangements for the past 2 months: Single Family Home              DME Arranged: N/A DME Agency: NA  Readmission Risk Interventions No flowsheet data found.

## 2020-08-12 NOTE — Progress Notes (Signed)
Occupational Therapy Treatment Patient Details Name: Adam Owen MRN: [id number] DOB: June 18, 1940 Today's Date: 08/12/2020    History of present illness Patient is a 80 year old male with fall at home resulting in nondisplaced fracture of the right greater trochanter, nondisplaced fractures of the right medial and posterior acetabulum and mildly displaced fracture of the right inferior pubic ramus. Pt treated non-operatively. PMH includes previous falls, R hip replacement, hearing loss in left ear, coronary artery disease, constipation, osteoporosis, pulmonary fibrosis, anemia, cancer, memory loss   OT comments  Treatment focused on promoting functional mobility with self care tasks. Patient +2 for safety to ambulate with walker to sink in bathroom. Able to stand at sink with one hand to steady himself. Needed straw to sip water to rinse. Required seated rest break after standing task. Patient making slow progress. Cont POC.   Follow Up Recommendations  SNF    Equipment Recommendations  None recommended by OT    Recommendations for Other Services      Precautions / Restrictions Precautions Precautions: Fall Precaution Comments: HOH; better in R ear Restrictions Weight Bearing Restrictions: No RLE Weight Bearing: Weight bearing as tolerated       Mobility Bed Mobility Overal bed mobility: Needs Assistance             General bed mobility comments: Pt OOB in recliner upon entry    Transfers Overall transfer level: Needs assistance Equipment used: Rolling walker (2 wheeled) Transfers: Sit to/from Stand Sit to Stand: +2 safety/equipment;Mod assist;+2 physical assistance Stand pivot transfers: Min assist;+2 safety/equipment       General transfer comment: +2 to ambulate to bathroom for safety and use of RW.Required seated rest break in recliner after standing at sink. Able to ambulate a second time to the door with +1 assist and chair follow. Assistance with  walker at times and cues to lift head.    Balance Overall balance assessment: Needs assistance Sitting-balance support: Bilateral upper extremity supported;Feet supported Sitting balance-Leahy Scale: Poor Sitting balance - Comments: has a tendency to lean to the left.   Standing balance support: Bilateral upper extremity supported Standing balance-Leahy Scale: Poor Standing balance comment: use of external support to maintain standing balance                           ADL either performed or assessed with clinical judgement   ADL Overall ADL's : Needs assistance/impaired     Grooming: Standing;Set up;Minimal assistance Grooming Details (indicate cue type and reason): Performed oral standing at sink with setup. Min  assist for steadying and balance. Unable to drink from regular cup and required straw to rinse. Used one hand on walker.                                     Vision Patient Visual Report: No change from baseline     Perception     Praxis      Cognition Arousal/Alertness: Awake/alert Behavior During Therapy: WFL for tasks assessed/performed Overall Cognitive Status: History of cognitive impairments - at baseline                                 General Comments: needs verbal cues        Exercises     Shoulder Instructions  General Comments      Pertinent Vitals/ Pain       Pain Assessment: Faces Faces Pain Scale: Hurts a little bit Pain Location: buttocks from sitting in recliner Pain Descriptors / Indicators: Aching;Discomfort Pain Intervention(s): Monitored during session  Home Living                                          Prior Functioning/Environment              Frequency  Min 2X/week        Progress Toward Goals  OT Goals(current goals can now be found in the care plan section)  Progress towards OT goals: Progressing toward goals  Acute Rehab OT Goals Patient  Stated Goal: to return to baseline. OT Goal Formulation: With family Time For Goal Achievement: 08/21/20 Potential to Achieve Goals: Good  Plan Discharge plan remains appropriate    Co-evaluation    PT/OT/SLP Co-Evaluation/Treatment: Yes Reason for Co-Treatment: For patient/therapist safety;To address functional/ADL transfers PT goals addressed during session: Mobility/safety with mobility OT goals addressed during session: ADL's and self-care      AM-PAC OT "6 Clicks" Daily Activity     Outcome Measure   Help from another person eating meals?: A Little Help from another person taking care of personal grooming?: A Little Help from another person toileting, which includes using toliet, bedpan, or urinal?: A Lot Help from another person bathing (including washing, rinsing, drying)?: A Lot Help from another person to put on and taking off regular upper body clothing?: A Little Help from another person to put on and taking off regular lower body clothing?: Total 6 Click Score: 14    End of Session Equipment Utilized During Treatment: Gait belt;Rolling walker  OT Visit Diagnosis: Unsteadiness on feet (R26.81);Other abnormalities of gait and mobility (R26.89);History of falling (Z91.81);Muscle weakness (generalized) (M62.81)   Activity Tolerance Patient tolerated treatment well   Patient Left in chair;with call bell/phone within reach;with chair alarm set;with family/visitor present   Nurse Communication Mobility status        Time: 4765-4650 OT Time Calculation (min): 23 min  Charges: OT General Charges $OT Visit: 1 Visit OT Treatments $Self Care/Home Management : 8-22 mins  Derl Barrow, OTR/L [place]  Office [phone] Pager: [place] 08/12/2020, 1:08 PM

## 2020-08-12 NOTE — Plan of Care (Signed)
  Problem: Nutrition: Goal: Adequate nutrition will be maintained Outcome: Progressing   Problem: Pain Managment: Goal: General experience of comfort will improve Outcome: Progressing   

## 2020-08-12 NOTE — Progress Notes (Signed)
PROGRESS NOTE    Adam Owen  [id number] DOB: July 26, 1940 DOA: 08/06/2020 PCP: Pcp, No   Brief Narrative:  The patient is a 80 year old Caucasian male with a past medical history significant for but not limited to CAD, pulmonary fibrosis, history of memory loss, GERD, gout, hypertension, hyperlipidemia as well as other multiple comorbidities who was brought into the ED by his daughter after a fall.  History is mostly obtained from the daughter over the phone and the ED physician and reportedly patient has been having more frequent falls and due to his social situations has been living with his daughter.  He had an unwitnessed fall yesterday where he landed on his right hip and was unable to get up or bear weight.  Upon arrival to the ED he was hemodynamically stable and initial x-rays were done showed that the right greater trochanter.  Prosthetic fracture was undisplaced right acetabular fracture.  Orthopedic surgery evaluated and opinion that this will be managed nonsurgically and recommended admission to the hospital service.  He underwent SLP evaluation and speech therapist recommending a full liquid diet well hospitalized and his nectar thick with thin okay between meals without other intake.  PT OT evaluating and recommending SNF and social worker in the process of trying to secure a bed as he is out of state insurance.  He has been faxed out to the facilities in [place] and his clinicals are being reviewed by facilities  Assessment & Plan:   Active Problems:   Closed right hip fracture (HCC)   Protein-calorie malnutrition, severe  Acute nondisplaced fracture of the right greater trochanter/fracture of the right medial and posterior acetabulum/moderate displaced fracture of the right inferior pubic rami -DG Pelvis and Femur showed "Changes consistent with right greater trochanter periprosthetic fracture.   Lucency in the medial aspect of the right acetabulum suspicious for  undisplaced fracture" and "Findings suggestive of right greater trochanter periprosthetic fracture. Findings consistent with undisplaced right  acetabular fracture. CT may be helpful for further evaluation." -CT Hip Right w/o Contrast showed "Acute nondisplaced fracture of the right greater trochanter. Acute nondisplaced fractures of the right medial and posterior acetabulum. Acute mildly displaced fracture of the  right inferior pubic ramus. Prior right total hip arthroplasty without evidence of hardware complication." -Orthopedic Sugery has been consulted and recommending nonsurgical management -Initially had him nonweightbearing but now they have recommended weightbearing as tolerated and will monitor serial x-rays -They are recommending PT OT evaluation and disposition pending the PT evaluation and they are recommending [facility] SNF; social worker is in the process of assisting SNF placement; he has been faxed out to [place] given his out-of-state insurance -Continue with pain control with hydrocodone-acetaminophen 1-2 tabs every 4 hours as needed for moderate pain -Discontinued normal saline at 75 MLS per hour; Tolerating meals well  -Repeat Hip X-rays reviewed by Ortho and they were felt to be stable. Per Dr. Mardelle Matte "The periprosthetic femur fracture also appears stable, the fracture extends a little bit more distal than I appreciated on initial review, however in comparison to the preoperative CAT scan I think that it is still stable and the stem has not shifted position.  I would not make any significant changes at this point from the orthopedic standpoint.  He is weightbearing as tolerated, although I am okay to minimize the strain put on the prosthesis until we get to the 6-week mark." -Patient is VTE prophylaxis with enoxaparin 40 mg subcu every 24  Dysphagia -SLP consulted and MBS  done.  Recommending full liquid diet with nectar thick liquids with thins in  between  Hyperlipidemia -C/w Atorvastain 40 mg po qEvening   Hypertension -C/w Metoprolol Tartrate 25 mg po BID -Continue to Monitor BP per protocol -Last BP reading was 120/52  Constipation -Started Senna-Docusate 1 tab po BID, Miralax 17 grams po BID and Bisacodyl Suppository 10 mg RC -Still no bowel movement yet  BPH -C/w Tamsulosin 0.8 mg po Daily   Macrocytic Anemia -Patient's hemoglobin/hematocrit went from 8.9/27.6 -> 8.2/25.7 -> 7.7/24.2 -> 8.2/26.4 -> 8.2/26.0 -> 8.0/24.6 -> 8.1/25.0 with an MCV of 108.2 -Check anemia panel in a.m. -Continue to monitor for signs and symptoms of bleeding; currently no overt bleeding noted next-repeat CBC in a.m.  Thrombocytopenia -Patient's platelet count went from 153 -> 138 -> 126 -> 130 -> 141 -> 140 -> 151 -continue monitor for signs and symptoms of bleeding -Repeat CBC in a.m.  Leukocytosis -Likely reactive in the setting of his fall -Patient's WBC is trended down a lot from 10.7 -> 9.1 -> 10.1 -> 8.8 -> 10.2 -> 8.8 -> 9.7 -Continue to Monitor and Trend   Hx of Gout -Continue to Monitor -Check Uric Acid Level and was normal at 4.3 -If worsens will start on Colchicine 0.6 mg po Daily (Previously taken)  Left Hand Wound and Skin Tear -Patient has a full-thickness skin tear which occurred when he hit his hand.  His skin is very thin and fragile with several bruised areas from the location.  The nursing staff has applied 3 Steri-Strips and Xeroform gauze to promote healing and recommending topical treatment orders continue the Xeroform gauze to the left hand every day and leave the Steri-Strips in place until they fall off and covering with foam dressings for 3 days  Severe protein calorie malnutrition in the context of Chronic illness/ Underweight -Nutritionist was consulted for further evaluation and assistance -Estimated body mass index is 15.92 kg/m as calculated from the following:   Height as of this encounter: 5\' 9"  (1.753  m).   Weight as of this encounter: 48.9 kg. -They are recommending Ensure Enlive p.o. twice daily, Boost Plus once/day; They will be ordering Magic Cup BID with meals -The speech therapist is recommending patient consuming liquids via teaspoon for the day and medications crushed with Ensure and they are completing an MBS in the next day  DVT prophylaxis: Enoxaparin 40 mg sq q24h Code Status: FULL CODE  Family Communication: Discussed with Daughter at bedside  Disposition Plan: Pending further clinical improvement. Likley to go to SNF when bed is available  Status is: Inpatient  Remains inpatient appropriate because:Unsafe d/c plan, IV treatments appropriate due to intensity of illness or inability to take PO, and Inpatient level of care appropriate due to severity of illness  Dispo: The patient is from: Home              Anticipated d/c is to: SNF              Patient currently is not medically stable to d/c.   Difficult to place patient No  Consultants:  Orthopedic Surgery   Procedures: None   Antimicrobials:  Anti-infectives (From admission, onward)    None        Subjective: Seen and examined at bedside and he is resting and watching Seinfeld. No CP or SOB. Still intermittently confused. No lightheadedness or dizziness and pain is controlled. No other concerns or complaints at this time.   Objective: Vitals:   08/11/20  2111 08/12/20 0500 08/12/20 0513 08/12/20 1354  BP: (!) 116/45  (!) 103/39 (!) 116/53  Pulse: (!) 59  66 (!) 56  Resp: 17  17 20   Temp: 98.2 F (36.8 C)  97.6 F (36.4 C) 98.1 F (36.7 C)  TempSrc: Oral  Oral Oral  SpO2: 97%  96% 97%  Weight:  48.9 kg    Height:        Intake/Output Summary (Last 24 hours) at 08/12/2020 1405 Last data filed at 08/12/2020 1053 Gross per 24 hour  Intake 120 ml  Output 875 ml  Net -755 ml    Filed Weights   08/08/20 0500 08/09/20 0500 08/12/20 0500  Weight: 50.1 kg 48.9 kg 48.9 kg   Examination: Physical  Exam:  Constitutional: The patient is a thin cachetic chronically ill-appearing Caucasian male in NAD Eyes: Lids and conjunctivae normal, sclerae anicteric  ENMT: External Ears, Nose appear normal. Grossly normal hearing.  Neck: Appears normal, supple, no cervical masses, normal ROM, no appreciable thyromegaly; no JVD Respiratory: Diminished to auscultation bilaterally, no wheezing, rales, rhonchi or crackles. Normal respiratory effort and patient is not tachypenic. No accessory muscle use. Unlabored breathing  Cardiovascular: RRR, no murmurs / rubs / gallops. S1 and S2 auscultated. No extremity edema.  Abdomen: Soft, non-tender, non-distended. Bowel sounds positive.  GU: Deferred. Musculoskeletal: No clubbing / cyanosis of digits/nails.  Skin: No rashes, lesions, ulcers but left hand has a Mepilex Pad on it. No induration; Warm and dry.  Neurologic: CN 2-12 grossly intact with no focal deficits. Romberg sign and cerebellar reflexes not assessed.  Psychiatric: Impaired judgment and insight. Alert and oriented x 2. Normal mood and appropriate affect.   Data Reviewed: I have personally reviewed following labs and imaging studies  CBC: Recent Labs  Lab 08/08/20 0326 08/09/20 0917 08/10/20 0415 08/11/20 0407 08/12/20 0405  WBC 8.8 10.2 8.8 9.7 8.8  NEUTROABS 6.8 8.2* 6.9 7.4 6.8  HGB 8.2* 8.2* 8.0* 8.1* 8.2*  HCT 26.4* 26.0* 24.6* 25.0* 25.4*  MCV 108.6* 109.2* 108.4* 108.2* 107.2*  PLT 130* 141* 140* 151 277    Basic Metabolic Panel: Recent Labs  Lab 08/08/20 0326 08/09/20 0917 08/10/20 0415 08/11/20 0407 08/12/20 0405  NA 138 138 140 138 137  K 3.9 4.0 4.6 4.8 4.6  CL 107 103 107 103 102  CO2 27 31 30 31  32  GLUCOSE 100* 104* 95 101* 98  BUN 13 13 16 22  27*  CREATININE 0.73 0.78 0.79 0.83 0.92  CALCIUM 8.9 8.8* 9.0 9.3 9.4  MG 1.7 2.0 1.9 2.0 2.1  PHOS 2.6 2.2* 2.6 3.5 3.3    GFR: Estimated Creatinine Clearance: 45 mL/min (by C-G formula based on SCr of 0.92  mg/dL). Liver Function Tests: Recent Labs  Lab 08/08/20 0326 08/09/20 0917 08/10/20 0415 08/11/20 0407 08/12/20 0405  AST 22 24 28 20 29   ALT 21 23 24 22 22   ALKPHOS 78 84 84 85 90  BILITOT 0.6 0.5 0.6 0.4 0.8  PROT 5.8* 6.0* 5.9* 6.2* 6.4*  ALBUMIN 2.4* 2.5* 2.4* 2.5* 2.6*    No results for input(s): LIPASE, AMYLASE in the last 168 hours. No results for input(s): AMMONIA in the last 168 hours. Coagulation Profile: No results for input(s): INR, PROTIME in the last 168 hours. Cardiac Enzymes: No results for input(s): CKTOTAL, CKMB, CKMBINDEX, TROPONINI in the last 168 hours. BNP (last 3 results) No results for input(s): PROBNP in the last 8760 hours. HbA1C: No results for input(s): HGBA1C in  the last 72 hours. CBG: No results for input(s): GLUCAP in the last 168 hours. Lipid Profile: No results for input(s): CHOL, HDL, LDLCALC, TRIG, CHOLHDL, LDLDIRECT in the last 72 hours. Thyroid Function Tests: No results for input(s): TSH, T4TOTAL, FREET4, T3FREE, THYROIDAB in the last 72 hours. Anemia Panel: No results for input(s): VITAMINB12, FOLATE, FERRITIN, TIBC, IRON, RETICCTPCT in the last 72 hours.  Sepsis Labs: No results for input(s): PROCALCITON, LATICACIDVEN in the last 168 hours.  Recent Results (from the past 240 hour(s))  Resp Panel by RT-PCR (Flu A&B, Covid) Nasopharyngeal Swab     Status: None   Collection Time: 08/06/20  8:26 AM   Specimen: Nasopharyngeal Swab; Nasopharyngeal(NP) swabs in vial transport medium  Result Value Ref Range Status   SARS Coronavirus 2 by RT PCR NEGATIVE NEGATIVE Final    Comment: (NOTE) SARS-CoV-2 target nucleic acids are NOT DETECTED.  The SARS-CoV-2 RNA is generally detectable in upper respiratory specimens during the acute phase of infection. The lowest concentration of SARS-CoV-2 viral copies this assay can detect is 138 copies/mL. A negative result does not preclude SARS-Cov-2 infection and should not be used as the sole basis  for treatment or other patient management decisions. A negative result may occur with  improper specimen collection/handling, submission of specimen other than nasopharyngeal swab, presence of viral mutation(s) within the areas targeted by this assay, and inadequate number of viral copies(<138 copies/mL). A negative result must be combined with clinical observations, patient history, and epidemiological information. The expected result is Negative.  Fact Sheet for Patients:  EntrepreneurPulse.com.au  Fact Sheet for Healthcare Providers:  IncredibleEmployment.be  This test is no t yet approved or cleared by the [place] FDA and  has been authorized for detection and/or diagnosis of SARS-CoV-2 by FDA under an Emergency Use Authorization (EUA). This EUA will remain  in effect (meaning this test can be used) for the duration of the COVID-19 declaration under Section 564(b)(1) of the Act, 21 U.S.C.section 360bbb-3(b)(1), unless the authorization is terminated  or revoked sooner.       Influenza A by PCR NEGATIVE NEGATIVE Final   Influenza B by PCR NEGATIVE NEGATIVE Final    Comment: (NOTE) The Xpert Xpress SARS-CoV-2/FLU/RSV plus assay is intended as an aid in the diagnosis of influenza from Nasopharyngeal swab specimens and should not be used as a sole basis for treatment. Nasal washings and aspirates are unacceptable for Xpert Xpress SARS-CoV-2/FLU/RSV testing.  Fact Sheet for Patients: EntrepreneurPulse.com.au  Fact Sheet for Healthcare Providers: IncredibleEmployment.be  This test is not yet approved or cleared by the [place] FDA and has been authorized for detection and/or diagnosis of SARS-CoV-2 by FDA under an Emergency Use Authorization (EUA). This EUA will remain in effect (meaning this test can be used) for the duration of the COVID-19 declaration under Section 564(b)(1) of the Act, 21  U.S.C. section 360bbb-3(b)(1), unless the authorization is terminated or revoked.  Performed at [facility], [address], [place], [place] [address]    RN Pressure Injury Documentation:     Estimated body mass index is 15.92 kg/m as calculated from the following:   Height as of this encounter: 5\' 9"  (1.753 m).   Weight as of this encounter: 48.9 kg.  Malnutrition Type:  Nutrition Problem: Severe Malnutrition Etiology: dysphagia, chronic illness  Malnutrition Characteristics:  Signs/Symptoms: energy intake < or equal to 75% for > or equal to 1 month, severe fat depletion  Nutrition Interventions:  Interventions: Ensure Enlive (each supplement provides  350kcal and 20 grams of protein), Hormel Shake, MVI, Magic cup, Boost Plus   Radiology Studies: DG Pelvis Portable  Result Date: 08/12/2020 CLINICAL DATA:  Right hip fracture. EXAM: PORTABLE PELVIS 1-2 VIEWS COMPARISON:  CT 08/06/2020.  Pelvis 08/06/2020. FINDINGS: Fracture of the tip of the right greater trochanter again noted. Fracture of the right inferior pubic ramus again noted. These fractures and the right acetabular fractures best identified on prior CT. Bilateral total hip replacements. Visualized hardware intact. Diffuse osteopenia. Degenerative changes lumbar spine. Stable calcific density noted over the left hip. Peripheral vascular calcification. IMPRESSION: 1. Fracture of the tip of the right greater trochanter again noted. Fracture of the right inferior pubic ramus again noted. These fractures and the right acetabular fractures best identified by prior CT. Again a proximal right femoral shaft fracture was identified on today's right femur exam. 2.  Bilateral total hip replacement.  Visualized hardware intact. Electronically Signed   By: Marcello Moores  Register   On: 08/12/2020 07:14   DG FEMUR, MIN 2 VIEWS RIGHT  Result Date: 08/12/2020 CLINICAL DATA:  Right hip fracture. EXAM: RIGHT FEMUR 2 VIEWS  COMPARISON:  CT 08/06/2020.  Right femur 08/06/2020. FINDINGS: Total right hip replacement. Hardware intact. Fracture of the tip of the right greater trochanter tip, acetabulum, right inferior pubic ramus best identified by prior CT. A fracture of the proximal right femoral shaft is noted on today's exam. Severe degenerative changes right knee. Peripheral vascular calcification. IMPRESSION: 1. Fracture of the right proximal femoral shaft noted on today's exam. 2. Fractures of the right greater trochanter tip, acetabulum, right inferior pubic ramus best identified by prior CT. 3. Total right hip replacement. Hardware intact. Severe degenerative changes right knee. 4.  Peripheral vascular disease. These results will be called to the ordering clinician or representative by the Radiologist Assistant, and communication documented in the PACS or [employer]. These results will be called to the ordering clinician or representative by the Radiologist Assistant, and communication documented in the PACS or [employer]. Electronically Signed   By: Marcello Moores  Register   On: 08/12/2020 07:06    Scheduled Meds:  aspirin EC  81 mg Oral Daily   atorvastatin  40 mg Oral QPM   enoxaparin (LOVENOX) injection  40 mg Subcutaneous Q24H   famotidine  20 mg Oral BID   feeding supplement  237 mL Oral BID BM   lactose free nutrition  237 mL Oral Q24H   metoprolol tartrate  25 mg Oral BID   polyethylene glycol  17 g Oral BID   senna-docusate  1 tablet Oral BID   tamsulosin  0.8 mg Oral QPM   Continuous Infusions:   LOS: 6 days   Kerney Elbe, DO [facility] PAGER is on AMION  If 7PM-7AM, please contact night-coverage [email or web address]

## 2020-08-12 NOTE — Progress Notes (Signed)
Nutrition Follow-up  DOCUMENTATION CODES:   Severe malnutrition in context of chronic illness, Underweight  INTERVENTION:  - continue Ensure Enlive BID and Boost Plus once/day.  - will order Magic Cup BID with meals, each supplement provides 290 kcal and 9 grams of protein   NUTRITION DIAGNOSIS:   Severe Malnutrition related to dysphagia, chronic illness as evidenced by energy intake < or equal to 75% for > or equal to 1 month, severe fat depletion. -ongoing  GOAL:   Patient will meet greater than or equal to 90% of their needs -minimally met on average.   MONITOR:   PO intake, Supplement acceptance, Weight trends, I & O's, Labs, Skin   ASSESSMENT:   80 y.o. male with history of hearing loss in left ear, coronary artery disease, constipation, osteoporosis, pulmonary fibrosis, anemia, cancer, memory loss who complains of right hip pain after fall. Admitted for Right hip periprosthetic greater trochanter fracture, acetabular fractures, right inferior pubic ramus fracture.  He is noted to be out of the room to Diagnostic Radiology. He is noted to be a/o to self only. Diet downgraded from Heart Healthy to [place] on 6/20 at 0915. He had been consuming 20-40% on Heart Healthy diet and the only meal completion documented since downgrade was 25% of breakfast today.  He has been accepting Boost Plus 100% of the time offered and Ensure 90% of the time offered.  Weight is trending down and weight today is -7 lb compared to admission weight. No information documented in the edema section of flow sheet this admission.   Labs reviewed; BUN: 27 mg/dl. Medications reviewed; 20 mg oral pepcid BID, 17 g miralax BID, 1 tablet senokot BID.     Diet Order:   Diet Order             Diet full liquid Room service appropriate? Yes; Fluid consistency: Nectar Thick  Diet effective now                   EDUCATION NEEDS:   Education needs have been addressed  Skin:  Skin Assessment: Reviewed  RN Assessment  Last BM:  6/21 (type 1 x2)  Height:   Ht Readings from Last 1 Encounters:  08/06/20 _0  (1.753 m)    Weight:   Wt Readings from Last 1 Encounters:  08/12/20 48.9 kg     Estimated Nutritional Needs:  Kcal:  1850-2050 Protein:  85-100g Fluid:  2L/day     Jarome Matin, MS, RD, LDN, CNSC Inpatient Clinical Dietitian RD pager # available in AMION  After hours/weekend pager # available in [facility]

## 2020-08-12 NOTE — Telephone Encounter (Signed)
CN confirmed with Allyn Kenner that meeting was arranged with [facility]us John for 08/11/20 at [facility].

## 2020-08-12 NOTE — Progress Notes (Signed)
Physical Therapy Treatment Patient Details Name: Adam Owen MRN: [id number] DOB: [birth year] Today's Date: 08/12/2020    History of Present Illness Patient is a 80 year old male with fall at home resulting in nondisplaced fracture of the right greater trochanter, nondisplaced fractures of the right medial and posterior acetabulum and mildly displaced fracture of the right inferior pubic ramus. Pt treated non-operatively. PMH includes previous falls, R hip replacement, hearing loss in left ear, coronary artery disease, constipation, osteoporosis, pulmonary fibrosis, anemia, cancer, memory loss    PT Comments    Pt progressing toward PT goals. Incr tol to gait/activity as well as standing today. Continue to recommend  SNF    Follow Up Recommendations  SNF     Equipment Recommendations  None recommended by PT    Recommendations for Other Services       Precautions / Restrictions Precautions Precautions: Fall Precaution Comments: HOH; better in R ear Restrictions Weight Bearing Restrictions: No RLE Weight Bearing: Weight bearing as tolerated Other Position/Activity Restrictions: WBAT    Mobility  Bed Mobility Overal bed mobility: Needs Assistance             General bed mobility comments: Pt OOB in recliner upon entry    Transfers Overall transfer level: Needs assistance Equipment used: Rolling walker (2 wheeled) Transfers: Sit to/from Stand Sit to Stand: Min assist;Mod assist;+2 physical assistance;+2 safety/equipment Stand pivot transfers: Min assist;+2 safety/equipment       General transfer comment: min to mod assist +2  for safety and power up to stand with cues for safe hand placement and forward weight shift in prep for sit to stand. Pt demonstrating retropulsion in standing and narrow BOS requiring cues to promote upright posure and increased asssist for balance.  Ambulation/Gait Ambulation/Gait assistance: Min assist;Mod assist;+2  safety/equipment Gait Distance (Feet): 20 Feet (x2) Assistive device: Rolling walker (2 wheeled) Gait Pattern/deviations: Step-to pattern;Decreased step length - right;Decreased step length - left;Trunk flexed;Narrow base of support Gait velocity: decr   General Gait Details: MIN-MOD assist for balance and RW stability, required seated rest after 20'   Stairs             Wheelchair Mobility    Modified Rankin (Stroke Patients Only)       Balance Overall balance assessment: Needs assistance Sitting-balance support: Feet supported;Bilateral upper extremity supported Sitting balance-Leahy Scale: Poor Sitting balance - Comments: has a tendency to lean to the left. Postural control: Posterior lean Standing balance support: Bilateral upper extremity supported Standing balance-Leahy Scale: Poor Standing balance comment: use of external support to maintain standing balance                            Cognition Arousal/Alertness: Awake/alert Behavior During Therapy: WFL for tasks assessed/performed Overall Cognitive Status: History of cognitive impairments - at baseline                                 General Comments: Pt appropriate during therapy, required intermittent repeated cues for command following.      Exercises General Exercises - Lower Extremity Long Arc Quad: AROM;Both;20 reps;Seated Straight Leg Raises: AROM;Both;5 reps;Seated Hip Flexion/Marching: AROM;Both;20 reps;Seated    General Comments        Pertinent Vitals/Pain Pain Assessment: Faces Faces Pain Scale: Hurts a little bit Pain Location: R hip/pelvis region Pain Descriptors / Indicators: Grimacing;Aching Pain Intervention(s): Limited activity within  patient's tolerance;Monitored during session;Premedicated before session;Repositioned    Home Living                      Prior Function            PT Goals (current goals can now be found in the care plan  section) Acute Rehab PT Goals Patient Stated Goal: none stated this session PT Goal Formulation: With patient/family Time For Goal Achievement: 08/21/20 Potential to Achieve Goals: Fair Progress towards PT goals: Progressing toward goals    Frequency    Min 3X/week      PT Plan Current plan remains appropriate    Co-evaluation PT/OT/SLP Co-Evaluation/Treatment: Yes Reason for Co-Treatment: To address functional/ADL transfers PT goals addressed during session: Mobility/safety with mobility;Proper use of DME;Balance OT goals addressed during session: ADL's and self-care;Proper use of Adaptive equipment and DME      AM-PAC PT "6 Clicks" Mobility   Outcome Measure  Help needed turning from your back to your side while in a flat bed without using bedrails?: A Lot Help needed moving from lying on your back to sitting on the side of a flat bed without using bedrails?: A Lot Help needed moving to and from a bed to a chair (including a wheelchair)?: A Lot Help needed standing up from a chair using your arms (e.g., wheelchair or bedside chair)?: A Lot Help needed to walk in hospital room?: A Lot Help needed climbing 3-5 steps with a railing? : Total 6 Click Score: 11    End of Session Equipment Utilized During Treatment: Gait belt Activity Tolerance: Patient tolerated treatment well;Patient limited by fatigue Patient left: in chair;with call bell/phone within reach;with chair alarm set;with family/visitor present Nurse Communication: Mobility status PT Visit Diagnosis: Unsteadiness on feet (R26.81);Muscle weakness (generalized) (M62.81);History of falling (Z91.81);Difficulty in walking, not elsewhere classified (R26.2);Pain Pain - Right/Left: Right Pain - part of body: Hip     Time: 4401-0272 PT Time Calculation (min) (ACUTE ONLY): 23 min  Charges:  $Gait Training: 8-22 mins                     Baxter Flattery, PT  Acute Rehab Dept ([facility]) [phone] Pager  [phone]  08/12/2020    [facility] 08/12/2020, 1:50 PM

## 2020-08-13 DIAGNOSIS — R627 Adult failure to thrive: Secondary | ICD-10-CM

## 2020-08-13 LAB — CBC WITH DIFFERENTIAL/PLATELET
Abs Immature Granulocytes: 0.14 10*3/uL — ABNORMAL HIGH (ref 0.00–0.07)
Basophils Absolute: 0 10*3/uL (ref 0.0–0.1)
Basophils Relative: 0 %
Eosinophils Absolute: 0.2 10*3/uL (ref 0.0–0.5)
Eosinophils Relative: 2 %
HCT: 25.1 % — ABNORMAL LOW (ref 39.0–52.0)
Hemoglobin: 8 g/dL — ABNORMAL LOW (ref 13.0–17.0)
Immature Granulocytes: 1 %
Lymphocytes Relative: 9 %
Lymphs Abs: 0.9 10*3/uL (ref 0.7–4.0)
MCH: 34.2 pg — ABNORMAL HIGH (ref 26.0–34.0)
MCHC: 31.9 g/dL (ref 30.0–36.0)
MCV: 107.3 fL — ABNORMAL HIGH (ref 80.0–100.0)
Monocytes Absolute: 1.1 10*3/uL — ABNORMAL HIGH (ref 0.1–1.0)
Monocytes Relative: 11 %
Neutro Abs: 8.4 10*3/uL — ABNORMAL HIGH (ref 1.7–7.7)
Neutrophils Relative %: 77 %
Platelets: 182 10*3/uL (ref 150–400)
RBC: 2.34 MIL/uL — ABNORMAL LOW (ref 4.22–5.81)
RDW: 19.5 % — ABNORMAL HIGH (ref 11.5–15.5)
WBC: 10.8 10*3/uL — ABNORMAL HIGH (ref 4.0–10.5)
nRBC: 0 % (ref 0.0–0.2)

## 2020-08-13 LAB — COMPREHENSIVE METABOLIC PANEL
ALT: 22 U/L (ref 0–44)
AST: 21 U/L (ref 15–41)
Albumin: 2.6 g/dL — ABNORMAL LOW (ref 3.5–5.0)
Alkaline Phosphatase: 77 U/L (ref 38–126)
Anion gap: 4 — ABNORMAL LOW (ref 5–15)
BUN: 32 mg/dL — ABNORMAL HIGH (ref 8–23)
CO2: 32 mmol/L (ref 22–32)
Calcium: 9.4 mg/dL (ref 8.9–10.3)
Chloride: 102 mmol/L (ref 98–111)
Creatinine, Ser: 0.89 mg/dL (ref 0.61–1.24)
GFR, Estimated: >60 mL/min (ref >60)
Glucose, Bld: 102 mg/dL — ABNORMAL HIGH (ref 70–99)
Potassium: 4.5 mmol/L (ref 3.5–5.1)
Sodium: 138 mmol/L (ref 135–145)
Total Bilirubin: 0.6 mg/dL (ref 0.3–1.2)
Total Protein: 6.2 g/dL — ABNORMAL LOW (ref 6.5–8.1)

## 2020-08-13 LAB — PHOSPHORUS: Phosphorus: 3 mg/dL (ref 2.5–4.6)

## 2020-08-13 LAB — MAGNESIUM: Magnesium: 1.9 mg/dL (ref 1.7–2.4)

## 2020-08-13 MED ORDER — ZINC OXIDE 40 % EX OINT
TOPICAL_OINTMENT | Freq: Two times a day (BID) | CUTANEOUS | Status: DC
  Administered 2020-08-13 – 2020-08-17 (×6): 1 via TOPICAL
  Filled 2020-08-13: qty 57

## 2020-08-13 MED ORDER — VITAMIN B-12 1000 MCG PO TABS
1000.0000 ug | ORAL_TABLET | Freq: Every day | ORAL | Status: DC
  Administered 2020-08-14 – 2020-08-18 (×5): 1000 ug via ORAL
  Filled 2020-08-13 (×5): qty 1

## 2020-08-13 NOTE — TOC Progression Note (Signed)
Transition of Care [facility]) - Progression Note   Patient Details  Name: Adam Owen MRN: [id number] Date of Birth: 1940-09-22  [facility] CM/SW [place], LCSW Phone Number: 08/13/2020, 3:12 PM  Clinical Narrative: CSW spoke with Jeneen Rinks through [facility] regarding insurance authorization for the single case agreement. Per Jeneen Rinks, the clinicals were received but approval cannot move forward without the family selecting a SNF and a new authorization will need to be started if the selected facility pulls out of the agreement. Family has selected [facility]. CSW provided facility to [place]. TOC awaiting approval from [facility] and an official bed offer from [facility].  Expected Discharge Plan: [place] Barriers to Discharge: Continued Medical Work up, SNF Pending bed offer, Ship broker  Expected Discharge Plan and Services Expected Discharge Plan: [place] In-house Referral: Clinical Social Work Post Acute Care Choice: [place] Living arrangements for the past 2 months: Single Family Home             DME Arranged: N/A DME Agency: NA  Readmission Risk Interventions No flowsheet data found.

## 2020-08-13 NOTE — Progress Notes (Signed)
PROGRESS NOTE    Adam Owen  [id number] DOB: [birth year] DOA: 08/06/2020 PCP: Pcp, No    Chief Complaint  Patient presents with   Fall    Brief Narrative:  History of CAD, pulmonary hypertension, pulmonary fibrosis, memory loss, gout, hypertension , dysphagia, weight loss, patient has been having more frequent falls lately and due to his social situations, he has been living with his daughter.  He had an unwitnessed fall at home where he landed on his right hip and was unable to get up or bear any weight. Found to have right inferior pubic ramus fracture , right greater trochanter fracture , right acetabular fractures and right femur fracture, due to patient borderline normal ambulatory status at baseline Ortho recommended conservative management.  Weightbearing as tolerated per Ortho recommendation ,awaiting for skilled nursing facility placement  Subjective:  He is laying in bed, currently denies pain, is hard of hearing, daughter at bedside  Assessment & Plan:   Active Problems:   Closed right hip fracture (HCC)   Protein-calorie malnutrition, severe   Acute nondisplaced fracture of right greater trochanter, right medial and posterior acetabulum, right femur Acute mildly displaced fracture of right inferior pubic ramus -Ortho Dr. Mardelle Matte consulted who recommended serial x-rays, conservative management, currently recommend weight-bear as tolerated, skilled nursing facility placement -Currently on Lovenox subcu injection for DVT prophylaxis -Patient has out-of-state insurance, appreciate social worker assistance  Dysphagia -Currently on full liquid diet with nectar thick liquid, medication to be crushed with pure, Hard cough after swallow;Multiple dry swallows after each bite/sip  Macrocytic anemia Hemoglobin around 8 in the last few days, MCV 107 B12 in 400 range, will start R00 supplement, folic acid unremarkable ,will check retic counts, and iron  panel  Thrombocytopenia Platelet nadir at 126, has been normal for the last few days  Hypertension/hyperlipidemia Continue home medication Lopressor and atorvastatin  Constipation Appear has resolved, BM documented  BPH Continue Flomax  History of gout Uric acid 4.3 Does not appear to have acute issues    Severe malnutrition in the context of chronic illness/underweight Nutritional Assessment: The patient's BMI is: Body mass index is 15.92 kg/m.Marland Kitchen Seen by dietician.  I agree with the assessment and plan as outlined below: Nutrition Status: Nutrition Problem: Severe Malnutrition Etiology: dysphagia, chronic illness Signs/Symptoms: energy intake < or equal to 75% for > or equal to 1 month, severe fat depletion Interventions: Ensure Enlive (each supplement provides 350kcal and 20 grams of protein), Hormel Shake, MVI, Magic cup, Boost Plus  .Left hand wound and skin tear Patient has a full-thickness skin tear which occurred when he hit his hand.  His skin is very thin and fragile with several bruised areas from the location.  The nursing staff has applied 3 Steri-Strips and Xeroform gauze to promote healing and recommending topical treatment orders continue the Xeroform gauze to the left hand every day and leave the Steri-Strips in place until they fall off and covering with foam dressings for 3 days  Pressure Injury 08/13/20 Coccyx Mid Stage 2 -  Partial thickness loss of dermis presenting as a shallow open injury with a red, pink wound bed without slough. (Active)  08/13/20 1200  Location: Coccyx  Location Orientation: Mid  Staging: Stage 2 -  Partial thickness loss of dermis presenting as a shallow open injury with a red, pink wound bed without slough.  Wound Description (Comments):   Present on Admission: No (on admission per daughter, it was a stage 1)    Unresulted  Labs (From admission, onward)     Start     Ordered   08/14/20 0500  Iron and TIBC  Tomorrow morning,   R        Question:  Specimen collection method  Answer:  Lab=Lab collect   08/13/20 1527   08/14/20 0500  Reticulocytes  Tomorrow morning,   R       Question:  Specimen collection method  Answer:  Lab=Lab collect   08/13/20 1527   08/14/20 0500  CBC with Differential/Platelet  Tomorrow morning,   R       Question:  Specimen collection method  Answer:  Lab=Lab collect   08/13/20 1527   08/14/20 8119  Basic metabolic panel  Tomorrow morning,   R       Question:  Specimen collection method  Answer:  Lab=Lab collect   08/13/20 1527   08/14/20 0500  Prealbumin  Tomorrow morning,   R       Question:  Specimen collection method  Answer:  Lab=Lab collect   08/13/20 1527              DVT prophylaxis: enoxaparin (LOVENOX) injection 40 mg Start: 08/06/20 1600   Code Status: Full Family Communication: Daughter at bedside Disposition:   Status is: Inpatient  Dispo: The patient is from: Home              Anticipated d/c is to: SNF              Anticipated d/c date is: He is medically ready to discharge, however he has out-of-state insurance, awaiting for insurance approval and SNF bed                Consultants:  Orthopedics Dr. Mardelle Matte Wound care  Procedures:  None  Antimicrobials:   Anti-infectives (From admission, onward)    None           Objective: Vitals:   08/12/20 2117 08/13/20 0545 08/13/20 0547 08/13/20 1355  BP: (!) 103/57 (!) 101/57 (!) 190/65 102/64  Pulse: 70 74 69 75  Resp: 17 16 17 16   Temp: 98 F (36.7 C) 98 F (36.7 C) 98 F (36.7 C) 97.9 F (36.6 C)  TempSrc: Oral Oral Oral Oral  SpO2: 94% 95% 96% 97%  Weight:      Height:        Intake/Output Summary (Last 24 hours) at 08/13/2020 1529 Last data filed at 08/13/2020 1441 Gross per 24 hour  Intake 600 ml  Output 1225 ml  Net -625 ml   Filed Weights   08/08/20 0500 08/09/20 0500 08/12/20 0500  Weight: 50.1 kg 48.9 kg 48.9 kg    Examination:  General exam: Thin , underweight, frail ,  chronically ill-appearing ,hard of hearing , not oriented to time but to place and person  Respiratory system: Clear to auscultation. Respiratory effort normal. Cardiovascular system: S1 & S2 heard, RRR.  No pedal edema. Gastrointestinal system: Abdomen is nondistended, soft and nontender.  Normal bowel sounds heard. Central nervous system: Alert and oriented.  Place and person, not to time. Extremities: Generalized weakness, baseline largely nonambulatory Skin: Scattered ecchymosis upper extremities Psychiatry: Calm and cooperative, does appear to have memory impairment    Data Reviewed: I have personally reviewed following labs and imaging studies  CBC: Recent Labs  Lab 08/09/20 0917 08/10/20 0415 08/11/20 0407 08/12/20 0405 08/13/20 0326  WBC 10.2 8.8 9.7 8.8 10.8*  NEUTROABS 8.2* 6.9 7.4 6.8 8.4*  HGB 8.2* 8.0* 8.1*  8.2* 8.0*  HCT 26.0* 24.6* 25.0* 25.4* 25.1*  MCV 109.2* 108.4* 108.2* 107.2* 107.3*  PLT 141* 140* 151 162 448    Basic Metabolic Panel: Recent Labs  Lab 08/09/20 0917 08/10/20 0415 08/11/20 0407 08/12/20 0405 08/13/20 0326  NA 138 140 138 137 138  K 4.0 4.6 4.8 4.6 4.5  CL 103 107 103 102 102  CO2 31 30 31  32 32  GLUCOSE 104* 95 101* 98 102*  BUN 13 16 22  27* 32*  CREATININE 0.78 0.79 0.83 0.92 0.89  CALCIUM 8.8* 9.0 9.3 9.4 9.4  MG 2.0 1.9 2.0 2.1 1.9  PHOS 2.2* 2.6 3.5 3.3 3.0    GFR: Estimated Creatinine Clearance: 46.5 mL/min (by C-G formula based on SCr of 0.89 mg/dL).  Liver Function Tests: Recent Labs  Lab 08/09/20 0917 08/10/20 0415 08/11/20 0407 08/12/20 0405 08/13/20 0326  AST 24 28 20 29 21   ALT 23 24 22 22 22   ALKPHOS 84 84 85 90 77  BILITOT 0.5 0.6 0.4 0.8 0.6  PROT 6.0* 5.9* 6.2* 6.4* 6.2*  ALBUMIN 2.5* 2.4* 2.5* 2.6* 2.6*    CBG: No results for input(s): GLUCAP in the last 168 hours.   Recent Results (from the past 240 hour(s))  Resp Panel by RT-PCR (Flu A&B, Covid) Nasopharyngeal Swab     Status: None    Collection Time: 08/06/20  8:26 AM   Specimen: Nasopharyngeal Swab; Nasopharyngeal(NP) swabs in vial transport medium  Result Value Ref Range Status   SARS Coronavirus 2 by RT PCR NEGATIVE NEGATIVE Final    Comment: (NOTE) SARS-CoV-2 target nucleic acids are NOT DETECTED.  The SARS-CoV-2 RNA is generally detectable in upper respiratory specimens during the acute phase of infection. The lowest concentration of SARS-CoV-2 viral copies this assay can detect is 138 copies/mL. A negative result does not preclude SARS-Cov-2 infection and should not be used as the sole basis for treatment or other patient management decisions. A negative result may occur with  improper specimen collection/handling, submission of specimen other than nasopharyngeal swab, presence of viral mutation(s) within the areas targeted by this assay, and inadequate number of viral copies(<138 copies/mL). A negative result must be combined with clinical observations, patient history, and epidemiological information. The expected result is Negative.  Fact Sheet for Patients:  EntrepreneurPulse.com.au  Fact Sheet for Healthcare Providers:  IncredibleEmployment.be  This test is no t yet approved or cleared by the [place] FDA and  has been authorized for detection and/or diagnosis of SARS-CoV-2 by FDA under an Emergency Use Authorization (EUA). This EUA will remain  in effect (meaning this test can be used) for the duration of the COVID-19 declaration under Section 564(b)(1) of the Act, 21 U.S.C.section 360bbb-3(b)(1), unless the authorization is terminated  or revoked sooner.       Influenza A by PCR NEGATIVE NEGATIVE Final   Influenza B by PCR NEGATIVE NEGATIVE Final    Comment: (NOTE) The Xpert Xpress SARS-CoV-2/FLU/RSV plus assay is intended as an aid in the diagnosis of influenza from Nasopharyngeal swab specimens and should not be used as a sole basis for treatment.  Nasal washings and aspirates are unacceptable for Xpert Xpress SARS-CoV-2/FLU/RSV testing.  Fact Sheet for Patients: EntrepreneurPulse.com.au  Fact Sheet for Healthcare Providers: IncredibleEmployment.be  This test is not yet approved or cleared by the [place] FDA and has been authorized for detection and/or diagnosis of SARS-CoV-2 by FDA under an Emergency Use Authorization (EUA). This EUA will remain in effect (meaning this test can  be used) for the duration of the COVID-19 declaration under Section 564(b)(1) of the Act, 21 U.S.C. section 360bbb-3(b)(1), unless the authorization is terminated or revoked.  Performed at [facility], [address] [place],  [address]          Radiology Studies: DG Pelvis Portable  Result Date: 08/12/2020 CLINICAL DATA:  Right hip fracture. EXAM: PORTABLE PELVIS 1-2 VIEWS COMPARISON:  CT 08/06/2020.  Pelvis 08/06/2020. FINDINGS: Fracture of the tip of the right greater trochanter again noted. Fracture of the right inferior pubic ramus again noted. These fractures and the right acetabular fractures best identified on prior CT. Bilateral total hip replacements. Visualized hardware intact. Diffuse osteopenia. Degenerative changes lumbar spine. Stable calcific density noted over the left hip. Peripheral vascular calcification. IMPRESSION: 1. Fracture of the tip of the right greater trochanter again noted. Fracture of the right inferior pubic ramus again noted. These fractures and the right acetabular fractures best identified by prior CT. Again a proximal right femoral shaft fracture was identified on today's right femur exam. 2.  Bilateral total hip replacement.  Visualized hardware intact. Electronically Signed   By: Marcello Moores  Register   On: 08/12/2020 07:14   DG FEMUR, MIN 2 VIEWS RIGHT  Result Date: 08/12/2020 CLINICAL DATA:  Right hip fracture. EXAM: RIGHT FEMUR 2 VIEWS COMPARISON:   CT 08/06/2020.  Right femur 08/06/2020. FINDINGS: Total right hip replacement. Hardware intact. Fracture of the tip of the right greater trochanter tip, acetabulum, right inferior pubic ramus best identified by prior CT. A fracture of the proximal right femoral shaft is noted on today's exam. Severe degenerative changes right knee. Peripheral vascular calcification. IMPRESSION: 1. Fracture of the right proximal femoral shaft noted on today's exam. 2. Fractures of the right greater trochanter tip, acetabulum, right inferior pubic ramus best identified by prior CT. 3. Total right hip replacement. Hardware intact. Severe degenerative changes right knee. 4.  Peripheral vascular disease. These results will be called to the ordering clinician or representative by the Radiologist Assistant, and communication documented in the PACS or [employer]. These results will be called to the ordering clinician or representative by the Radiologist Assistant, and communication documented in the PACS or [employer]. Electronically Signed   By: Marcello Moores  Register   On: 08/12/2020 07:06        Scheduled Meds:  aspirin EC  81 mg Oral Daily   atorvastatin  40 mg Oral QPM   enoxaparin (LOVENOX) injection  40 mg Subcutaneous Q24H   famotidine  20 mg Oral BID   feeding supplement  237 mL Oral BID BM   lactose free nutrition  237 mL Oral Q24H   liver oil-zinc oxide   Topical BID   metoprolol tartrate  25 mg Oral BID   polyethylene glycol  17 g Oral BID   senna-docusate  1 tablet Oral BID   tamsulosin  0.8 mg Oral QPM   [START ON 08/14/2020] vitamin B-12  1,000 mcg Oral Daily   Continuous Infusions:   LOS: 7 days   Time spent: 47mins Greater than 50% of this time was spent in counseling, explanation of diagnosis, planning of further management, and coordination of care.   Voice Recognition Viviann Spare dictation system was used to create this note, attempts have been made to correct errors. Please contact the  author with questions and/or clarifications.   Florencia Reasons, MD PhD FACP [facility]  Available via Epic secure chat 7am-7pm for nonurgent issues Please page for urgent issues To page  the attending provider between 7A-7P or the covering provider during after hours 7P-7A, please log into the web site [email or web address] and access using universal [place] password for that web site. If you do not have the password, please call the hospital operator.    08/13/2020, 3:29 PM

## 2020-08-14 ENCOUNTER — Telehealth: Payer: Self-pay | Admitting: *Deleted

## 2020-08-14 ENCOUNTER — Encounter: Payer: Self-pay | Admitting: *Deleted

## 2020-08-14 LAB — CBC WITH DIFFERENTIAL/PLATELET
Abs Immature Granulocytes: 0.08 10*3/uL — ABNORMAL HIGH (ref 0.00–0.07)
Basophils Absolute: 0 10*3/uL (ref 0.0–0.1)
Basophils Relative: 0 %
Eosinophils Absolute: 0.1 10*3/uL (ref 0.0–0.5)
Eosinophils Relative: 1 %
HCT: 25.2 % — ABNORMAL LOW (ref 39.0–52.0)
Hemoglobin: 8 g/dL — ABNORMAL LOW (ref 13.0–17.0)
Immature Granulocytes: 1 %
Lymphocytes Relative: 8 %
Lymphs Abs: 0.9 10*3/uL (ref 0.7–4.0)
MCH: 34.6 pg — ABNORMAL HIGH (ref 26.0–34.0)
MCHC: 31.7 g/dL (ref 30.0–36.0)
MCV: 109.1 fL — ABNORMAL HIGH (ref 80.0–100.0)
Monocytes Absolute: 1.3 10*3/uL — ABNORMAL HIGH (ref 0.1–1.0)
Monocytes Relative: 12 %
Neutro Abs: 8 10*3/uL — ABNORMAL HIGH (ref 1.7–7.7)
Neutrophils Relative %: 78 %
Platelets: 187 10*3/uL (ref 150–400)
RBC: 2.31 MIL/uL — ABNORMAL LOW (ref 4.22–5.81)
RDW: 19.9 % — ABNORMAL HIGH (ref 11.5–15.5)
WBC: 10.4 10*3/uL (ref 4.0–10.5)
nRBC: 0 % (ref 0.0–0.2)

## 2020-08-14 LAB — IRON AND TIBC
Iron: 22 ug/dL — ABNORMAL LOW (ref 45–182)
Saturation Ratios: 10 % — ABNORMAL LOW (ref 17.9–39.5)
TIBC: 217 ug/dL — ABNORMAL LOW (ref 250–450)
UIBC: 195 ug/dL

## 2020-08-14 LAB — BASIC METABOLIC PANEL
Anion gap: 2 — ABNORMAL LOW (ref 5–15)
BUN: 27 mg/dL — ABNORMAL HIGH (ref 8–23)
CO2: 31 mmol/L (ref 22–32)
Calcium: 9.1 mg/dL (ref 8.9–10.3)
Chloride: 103 mmol/L (ref 98–111)
Creatinine, Ser: 1.01 mg/dL (ref 0.61–1.24)
GFR, Estimated: >60 mL/min (ref >60)
Glucose, Bld: 104 mg/dL — ABNORMAL HIGH (ref 70–99)
Potassium: 4.5 mmol/L (ref 3.5–5.1)
Sodium: 136 mmol/L (ref 135–145)

## 2020-08-14 LAB — RETICULOCYTES
Immature Retic Fract: 31.6 % — ABNORMAL HIGH (ref 2.3–15.9)
RBC.: 2.3 MIL/uL — ABNORMAL LOW (ref 4.22–5.81)
Retic Count, Absolute: 54.1 10*3/uL (ref 19.0–186.0)
Retic Ct Pct: 2.4 % (ref 0.4–3.1)

## 2020-08-14 LAB — PREALBUMIN: Prealbumin: 9.8 mg/dL — ABNORMAL LOW (ref 18–38)

## 2020-08-14 MED ORDER — RESOURCE INSTANT PROTEIN PO PWD PACKET
1.0000 | Freq: Three times a day (TID) | ORAL | Status: AC
  Administered 2020-08-14 – 2020-08-16 (×3): 6 g via ORAL
  Filled 2020-08-14 (×6): qty 6

## 2020-08-14 NOTE — Telephone Encounter (Signed)
Patient's daughter, Broadus John telephoned to inform CN  on 08/12/20 that she and her sister, Benjamine Mola have found two options for SNF that [employer] might cover.  They are [facility] [address] and [facility] [place]. Her sister Benjamine Mola is communicating with insurance company about coverage as there ia a question that [facility] will cover a [facility].

## 2020-08-14 NOTE — Congregational Nurse Program (Signed)
CN met with Mrs. Adam Owen this morning, 08/11/20 at [employer] to discuss her father, Adam Owen.  Adam Owen is originally from [place].  He is married, but his daughter transported him from the [place] area on 08/03/20.  Mrs. Owen reports that her father had a fall in the home he shared with his wife on 5/19 and EMS was called.  There was no definitive information given in regard to any fractures patient sustained when he fell in [place].  Daughter reports that her father stated to her and her sister, Adam Owen that he was in fear for his life and desired to leave [place].  This is when she brought him to [place] on 6/12./22.  On 08/05/20, daughter reports that her father fell in the front room of her home, was transported to [facility] by EMS, admitted to [facility] and diagnosed with fracture of femur.  Physician  notes state patient does not require surgery at this time.  Patient is presently waiting to be transferred to a SNF.  Social worker is seeking to have patient be admitted to [facility] in [place], [place] or [facility] or [facility] in [place].  Awaiting insurance approvals.  CN will follow up with daughter and offer support. 

## 2020-08-14 NOTE — Plan of Care (Signed)
Plan of care reviewed. 

## 2020-08-14 NOTE — Progress Notes (Signed)
  Speech Language Pathology Treatment: Dysphagia  Patient Details Name: Adam Owen MRN: [id number] DOB: 1940-06-27 Today's Date: 08/14/2020 Time: 8588-5027 SLP Time Calculation (min) (ACUTE ONLY): 20 min  Assessment / Plan / Recommendation Clinical Impression  Second session with daughter and pt to discuss risk factors for aspiratios pneumonias, compensation strategies and increased asp risk.  Pt and daughter agree to increased risk and desire advancement.  SLP advised they consider a pallaitive consult to establish GOC, Adam Owen, daughter advised that she wishes to talk to her siblings prior to contemplating this meeting.  Daughter and pt report the family and pt want pt to remain a full code. Support offered to daughter as she became tearful during discussion with multiple social situations/pressures.  SLP advised would relay information and requests to MD.    HPI HPI: Patient is a 80 yo male adm to [facility] after fall at his daughter's home.  Found to have hip fx but no surgical intervention warranted. 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 Pt reportedly has occasional nasal regurgitation and coughing with po intake.  He saw ENT with plans for swallow study but this was not completed as patient now admitted to hospital after a fall. Per ENT's note, pt has acquired velopharyngeal incompetence and no recurrence of lesions.  Weight loss reported prior to pt moving to [place] with his daughter.  He appears to be frail and deconditioned.  SLP was reordered to re-evaluate swallow as pt desires advanced diet.      SLP Plan  Continue with current plan of care       Recommendations  Diet recommendations: Dysphagia 3 (mechanical soft);Thin liquid;Nectar-thick liquid Liquids provided via: Cup;Straw Medication Administration: Crushed with  puree Supervision: Full supervision/cueing for compensatory strategies;Staff to assist with self feeding Compensations: Slow rate;Small sips/bites;Other (Comment);Hard cough after swallow;Multiple dry swallows after each bite/sip;Effortful swallow (cough and hock or cough/re-swallow if pt is clearing his throat) Postural Changes and/or Swallow Maneuvers: Upright 30-60 min after meal                Oral Care Recommendations: Oral care QID;Staff/trained caregiver to provide oral care Follow up Recommendations: Skilled Nursing facility SLP Visit Diagnosis: Dysphagia, oropharyngeal phase (R13.12);Dysphagia, pharyngoesophageal phase (R13.14) Plan: Continue with current plan of care       GO              Adam Lime, MS [place] Office [phone] Pager [phone]   Adam Owen 08/14/2020, 6:39 PM

## 2020-08-14 NOTE — Progress Notes (Signed)
  Speech Language Pathology Treatment: Dysphagia  Patient Details Name: Adam Owen MRN: [id number] DOB: March 08, 1940 Today's Date: 08/14/2020 Time: 8119-1478 SLP Time Calculation (min) (ACUTE ONLY): 40 min  Assessment / Plan / Recommendation Clinical Impression  Upon discussion with pt and his daughter regarding pt's dysphagia with options to repeat MBS *which will certainly only demonstrate ongoing severe dysphagia* or advance diet texture with strict aspiration precautions to determine tolerance *given pt with baseline chronic dysphagia which he has managed evidenced by negative CXRs. etc.    SLP does not advise repeating MBS and daughter/pt report understanding to potential increased aspiration and aspiration pna risk with advancement.  SLP observed pt with intake using a normal posture of sitting upright documenting s/s of aspiration/dysphagia.  Pt agreeable to consume po for trials but needed to readjust his posture twice during session.    Pt self fed advanced textures of graham cracker boluses x4, cereal bar bolus x2, banana x3, applesauce boluses x5 and Ensure boluses x2.   He demonstratres multiple swallows across all consistencies followed by throat clearing.    Upon viewing, applesauce retention noted on posterior pharyngeal wall without pt sensation.  She benefited from verbal cues to cough and expectorate applesauce and graham cracker retention or cough and re=swallow to help clear.  Pt reports desire for advanced textures with precautions.     HPI HPI: Patient is a 80 yo male adm to [facility] after fall at his daughter's home.  Found to have hip fx but no surgical intervention warranted. 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 Pt reportedly has occasional nasal regurgitation and coughing with po intake.  He saw ENT with plans for  swallow study but this was not completed as patient now admitted to hospital after a fall. Per ENT's note, pt has acquired velopharyngeal incompetence and no recurrence of lesions.  Weight loss reported prior to pt moving to [place] with his daughter.  He appears to be frail and deconditioned.  SLP was reordered to re-evaluate swallow as pt desires advanced diet.      SLP Plan  Continue with current plan of care       Recommendations  Liquids provided via: Cup;Straw Medication Administration: Crushed with puree Supervision: Full supervision/cueing for compensatory strategies;Staff to assist with self feeding Compensations: Slow rate;Small sips/bites;Other (Comment);Hard cough after swallow;Multiple dry swallows after each bite/sip;Effortful swallow (if clearing his throat, cough and expectorate or cough and re-swallow) Postural Changes and/or Swallow Maneuvers: Upright 30-60 min after meal                Oral Care Recommendations: Oral care QID;Staff/trained caregiver to provide oral care Follow up Recommendations: Skilled Nursing facility SLP Visit Diagnosis: Dysphagia, unspecified (R13.10) Plan: Continue with current plan of care       GO              Adam Lime, MS [place] Office [phone] Pager [phone]   Adam Owen 08/14/2020, 6:27 PM

## 2020-08-14 NOTE — Telephone Encounter (Signed)
CN spoke  today with daughter, [name] who said her dad had a nice visit with Rhea Belton from [facility] yesterday morning.  She reports that yesterday afternoon/evening he showed some confusion and was upset, but seems a little better this morning.  She notes that he is experiencing some stiffness so is encouraging him so move under guidance of medical personnel.

## 2020-08-14 NOTE — Progress Notes (Signed)
Occupational Therapy Treatment Patient Details Name: Adam Owen MRN: [id number] DOB: 10-31-1940 Today's Date: 08/14/2020    History of present illness Patient is a 80 year old male with fall at home resulting in nondisplaced fracture of the right greater trochanter, nondisplaced fractures of the right medial and posterior acetabulum and mildly displaced fracture of the right inferior pubic ramus. Pt treated non-operatively. PMH includes previous falls, R hip replacement, hearing loss in left ear, coronary artery disease, constipation, osteoporosis, pulmonary fibrosis, anemia, cancer, memory loss   OT comments  Patient with significant rigidity in lower extremities, unable to slide along bed to get up and needing assist to slide together to edge of bed while tech provide heavy mod A at trunk. With cues for body mechanics patient mod x2 to stand and needing increased time and tactile cues to correct standing posture for balance. Attempt ambulation with walker however after ~6-7 steps and cues to sequence patient states "I can't I have to sit," reporting pain in bilateral knees. Patient requesting to use bedside commode needing mod x2 to pivot and total A for perianal care after small bowel movement, notified RN patient unable to go much.     Follow Up Recommendations  SNF    Equipment Recommendations  None recommended by OT       Precautions / Restrictions Precautions Precautions: Fall Precaution Comments: HOH; better in R ear Restrictions RLE Weight Bearing: Weight bearing as tolerated       Mobility Bed Mobility Overal bed mobility: Needs Assistance Bed Mobility: Supine to Sit     Supine to sit: Mod assist;+2 for physical assistance;HOB elevated     General bed mobility comments: significant rigidity in lower extremities having to mobilize together while tech support at trunk level    Transfers Overall transfer level: Needs assistance Equipment used: Rolling walker  (2 wheeled);None Transfers: Sit to/from [facility] Sit to Stand: Mod assist;+2 physical assistance;+2 safety/equipment Stand pivot transfers: Mod assist;+2 physical assistance;+2 safety/equipment       General transfer comment: limited strength and standing tolerance 2* knee pain, needing verbal cues for body mechanics    Balance Overall balance assessment: Needs assistance Sitting-balance support: Feet supported;Bilateral upper extremity supported Sitting balance-Leahy Scale: Poor     Standing balance support: Bilateral upper extremity supported Standing balance-Leahy Scale: Poor Standing balance comment: needs B UE support and up to mod-max A                           ADL either performed or assessed with clinical judgement   ADL Overall ADL's : Needs assistance/impaired                         Toilet Transfer: Moderate assistance;+2 for physical assistance;+2 for safety/equipment;Stand-pivot;BSC Toilet Transfer Details (indicate cue type and reason): significant difficulty trying to pivot on feet to and from commode, rigidity in B LEs and reporting knee pain Toileting- Clothing Manipulation and Hygiene: Total assistance;Sit to/from stand;+2 for safety/equipment;+2 for physical assistance Toileting - Clothing Manipulation Details (indicate cue type and reason): reliant on B UE support and mod to max A to stand while peri care performed     Functional mobility during ADLs: Moderate assistance;+2 for physical assistance;+2 for safety/equipment;Cueing for safety;Cueing for sequencing;Rolling walker General ADL Comments: decreased standing tolerance due to knee pain      Cognition Arousal/Alertness: Awake/alert Behavior During Therapy: [facility] for tasks assessed/performed Overall Cognitive  Status: History of cognitive impairments - at baseline                                                     Pertinent Vitals/ Pain        Pain Assessment: Faces Faces Pain Scale: Hurts even more Pain Location: bilateral knees Pain Descriptors / Indicators: Grimacing;Aching Pain Intervention(s): Monitored during session;Limited activity within patient's tolerance         Frequency  Min 2X/week        Progress Toward Goals  OT Goals(current goals can now be found in the care plan section)  Progress towards OT goals: Progressing toward goals  Acute Rehab OT Goals Patient Stated Goal: use commode OT Goal Formulation: With patient Time For Goal Achievement: 08/21/20 Potential to Achieve Goals: Good ADL Goals Pt Will Transfer to Toilet: with min assist;ambulating;bedside commode (walker) Pt/caregiver will Perform Home Exercise Program: Increased strength;Both right and left upper extremity;With written HEP provided Additional ADL Goal #1: Patient will require min A for bed mobility in preparation for self care tasks. Additional ADL Goal #2: Patient will require min A for standing activity for 5 minutes in order to participate in daily routine.  Plan Discharge plan remains appropriate       AM-PAC OT "6 Clicks" Daily Activity     Outcome Measure   Help from another person eating meals?: A Little Help from another person taking care of personal grooming?: A Little Help from another person toileting, which includes using toliet, bedpan, or urinal?: Total Help from another person bathing (including washing, rinsing, drying)?: A Lot Help from another person to put on and taking off regular upper body clothing?: A Little Help from another person to put on and taking off regular lower body clothing?: Total 6 Click Score: 13    End of Session Equipment Utilized During Treatment: Gait belt;Rolling walker  OT Visit Diagnosis: Unsteadiness on feet (R26.81);Other abnormalities of gait and mobility (R26.89);History of falling (Z91.81);Muscle weakness (generalized) (M62.81);Pain Pain - Right/Left:  (bilateral) Pain -  part of body: Knee   Activity Tolerance Patient limited by pain   Patient Left in chair;with call bell/phone within reach;with family/visitor present;with chair alarm set   Nurse Communication Mobility status;Patient requests pain meds        Time: 1001-1030 OT Time Calculation (min): 29 min  Charges: OT General Charges $OT Visit: 1 Visit OT Treatments $Self Care/Home Management : 23-37 mins  Delbert Phenix OT OT pager: [phone]   Rosemary Holms 08/14/2020, 12:56 PM

## 2020-08-14 NOTE — Progress Notes (Addendum)
PROGRESS NOTE    Adam Owen  [id number] DOB: [birth year] DOA: 08/06/2020 PCP: Pcp, No    Chief Complaint  Patient presents with   Fall    Brief Narrative:  History of CAD, pulmonary hypertension, pulmonary fibrosis, memory loss, gout, hypertension , dysphagia, weight loss, patient has been having more frequent falls lately and due to his social situations, he has been living with his daughter.  He had an unwitnessed fall at home where he landed on his right hip and was unable to get up or bear any weight. Found to have right inferior pubic ramus fracture , right greater trochanter fracture , right acetabular fractures and right femur fracture, due to patient borderline normal ambulatory status at baseline Ortho recommended conservative management.  Weightbearing as tolerated per Ortho recommendation ,awaiting for skilled nursing facility placement  Subjective:  He is sitting up in chair, c/o generalized pain, c/o being constipated, daughter at bedside  is hard of hearing, daughter at bedside  Assessment & Plan:   Active Problems:   Closed right hip fracture (HCC)   Protein-calorie malnutrition, severe   Acute nondisplaced fracture of right greater trochanter, right medial and posterior acetabulum, right femur Acute mildly displaced fracture of right inferior pubic ramus -Ortho Dr. Mardelle Matte consulted who recommended serial x-rays, conservative management, currently recommend weight-bear as tolerated, skilled nursing facility placement -Currently on Lovenox subcu injection for DVT prophylaxis -Patient has out-of-state insurance, appreciate social worker assistance  Dysphagia -Currently on full liquid diet with nectar thick liquid, medication to be crushed with pure, Hard cough after swallow;Multiple dry swallows after each bite/sip  Macrocytic anemia, likely from nutrition deficit and anemia of chronic disease Hemoglobin around 8 in the last few days, MCV 107 B12 in  400 range,  started on D97 supplement, folic acid unremarkable ,retic counts inappropriately low, and iron panel iron sat 10% Plan to start iron supplement once constipation resolves  Thrombocytopenia Platelet nadir at 126, has been normal for the last few days  Hypertension/hyperlipidemia Continue home medication Lopressor and atorvastatin  Irregular heart beat, no chest pain, no sob, no syncope, will get an ekg  Addendum: ekg showed sinus rhythm Irregular heart beat likely from occasional ectopic beats  Constipation Remain constipated, continue stool softener, add on suppository prn  BPH Continue Flomax  History of gout Uric acid 4.3 Does not appear to have acute issues    Severe malnutrition in the context of chronic illness/underweight Nutritional Assessment: The patient's BMI is: Body mass index is 15.92 kg/m.Marland Kitchen Seen by dietician.  I agree with the assessment and plan as outlined below: Nutrition Status: Nutrition Problem: Severe Malnutrition Etiology: dysphagia, chronic illness Signs/Symptoms: energy intake < or equal to 75% for > or equal to 1 month, severe fat depletion Interventions: Ensure Enlive (each supplement provides 350kcal and 20 grams of protein), Hormel Shake, MVI, Magic cup, Boost Plus  .Left hand wound and skin tear Patient has a full-thickness skin tear which occurred when he hit his hand.  His skin is very thin and fragile with several bruised areas from the location.  The nursing staff has applied 3 Steri-Strips and Xeroform gauze to promote healing and recommending topical treatment orders continue the Xeroform gauze to the left hand every day and leave the Steri-Strips in place until they fall off and covering with foam dressings for 3 days  Sacral wound appear to be moisture related, wound care input appreciated   Unresulted Labs (From admission, onward)    None  DVT prophylaxis: enoxaparin (LOVENOX) injection 40 mg Start: 08/06/20  1600   Code Status: Full Family Communication: Daughter at bedside Disposition:   Status is: Inpatient  Dispo: The patient is from: Home              Anticipated d/c is to: SNF              Anticipated d/c date is: He is medically ready to discharge, however he has out-of-state insurance, awaiting for insurance approval and SNF bed                Consultants:  Orthopedics Dr. Mardelle Matte Wound care  Procedures:  None  Antimicrobials:   Anti-infectives (From admission, onward)    None           Objective: Vitals:   08/13/20 0547 08/13/20 1355 08/13/20 2134 08/14/20 0639  BP: (!) 190/65 102/64 (!) 113/54 (!) 107/38  Pulse: 69 75 77 66  Resp: 17 16 (!) 24 14  Temp: 98 F (36.7 C) 97.9 F (36.6 C) 99.4 F (37.4 C) 98 F (36.7 C)  TempSrc: Oral Oral Oral Oral  SpO2: 96% 97% 98% 97%  Weight:      Height:        Intake/Output Summary (Last 24 hours) at 08/14/2020 1051 Last data filed at 08/14/2020 2952 Gross per 24 hour  Intake 840 ml  Output 1150 ml  Net -310 ml   Filed Weights   08/08/20 0500 08/09/20 0500 08/12/20 0500  Weight: 50.1 kg 48.9 kg 48.9 kg    Examination:  General exam: Thin , underweight, frail , chronically ill-appearing ,hard of hearing , not oriented to time but to place and person , oriented to situation  Respiratory system: Clear to auscultation. Respiratory effort normal. Cardiovascular system: S1 & S2 heard, appear irregular,  No pedal edema. Gastrointestinal system: Abdomen is nondistended, soft and nontender.  Normal bowel sounds heard. Central nervous system: Alert and oriented.  Place and person, not to time. Extremities: Generalized weakness, baseline largely nonambulatory Skin: Scattered ecchymosis upper extremities Psychiatry: Calm and cooperative, does appear to have memory impairment    Data Reviewed: I have personally reviewed following labs and imaging studies  CBC: Recent Labs  Lab 08/10/20 0415 08/11/20 0407  08/12/20 0405 08/13/20 0326 08/14/20 0339  WBC 8.8 9.7 8.8 10.8* 10.4  NEUTROABS 6.9 7.4 6.8 8.4* 8.0*  HGB 8.0* 8.1* 8.2* 8.0* 8.0*  HCT 24.6* 25.0* 25.4* 25.1* 25.2*  MCV 108.4* 108.2* 107.2* 107.3* 109.1*  PLT 140* 151 162 182 841    Basic Metabolic Panel: Recent Labs  Lab 08/09/20 0917 08/10/20 0415 08/11/20 0407 08/12/20 0405 08/13/20 0326 08/14/20 0339  NA 138 140 138 137 138 136  K 4.0 4.6 4.8 4.6 4.5 4.5  CL 103 107 103 102 102 103  CO2 31 30 31  32 32 31  GLUCOSE 104* 95 101* 98 102* 104*  BUN 13 16 22  27* 32* 27*  CREATININE 0.78 0.79 0.83 0.92 0.89 1.01  CALCIUM 8.8* 9.0 9.3 9.4 9.4 9.1  MG 2.0 1.9 2.0 2.1 1.9  --   PHOS 2.2* 2.6 3.5 3.3 3.0  --     GFR: Estimated Creatinine Clearance: 41 mL/min (by C-G formula based on SCr of 1.01 mg/dL).  Liver Function Tests: Recent Labs  Lab 08/09/20 0917 08/10/20 0415 08/11/20 0407 08/12/20 0405 08/13/20 0326  AST 24 28 20 29 21   ALT 23 24 22 22 22   ALKPHOS 84 84 85 90 77  BILITOT 0.5 0.6 0.4 0.8 0.6  PROT 6.0* 5.9* 6.2* 6.4* 6.2*  ALBUMIN 2.5* 2.4* 2.5* 2.6* 2.6*    CBG: No results for input(s): GLUCAP in the last 168 hours.   Recent Results (from the past 240 hour(s))  Resp Panel by RT-PCR (Flu A&B, Covid) Nasopharyngeal Swab     Status: None   Collection Time: 08/06/20  8:26 AM   Specimen: Nasopharyngeal Swab; Nasopharyngeal(NP) swabs in vial transport medium  Result Value Ref Range Status   SARS Coronavirus 2 by RT PCR NEGATIVE NEGATIVE Final    Comment: (NOTE) SARS-CoV-2 target nucleic acids are NOT DETECTED.  The SARS-CoV-2 RNA is generally detectable in upper respiratory specimens during the acute phase of infection. The lowest concentration of SARS-CoV-2 viral copies this assay can detect is 138 copies/mL. A negative result does not preclude SARS-Cov-2 infection and should not be used as the sole basis for treatment or other patient management decisions. A negative result may occur with   improper specimen collection/handling, submission of specimen other than nasopharyngeal swab, presence of viral mutation(s) within the areas targeted by this assay, and inadequate number of viral copies(<138 copies/mL). A negative result must be combined with clinical observations, patient history, and epidemiological information. The expected result is Negative.  Fact Sheet for Patients:  EntrepreneurPulse.com.au  Fact Sheet for Healthcare Providers:  IncredibleEmployment.be  This test is no t yet approved or cleared by the [place] FDA and  has been authorized for detection and/or diagnosis of SARS-CoV-2 by FDA under an Emergency Use Authorization (EUA). This EUA will remain  in effect (meaning this test can be used) for the duration of the COVID-19 declaration under Section 564(b)(1) of the Act, 21 U.S.C.section 360bbb-3(b)(1), unless the authorization is terminated  or revoked sooner.       Influenza A by PCR NEGATIVE NEGATIVE Final   Influenza B by PCR NEGATIVE NEGATIVE Final    Comment: (NOTE) The Xpert Xpress SARS-CoV-2/FLU/RSV plus assay is intended as an aid in the diagnosis of influenza from Nasopharyngeal swab specimens and should not be used as a sole basis for treatment. Nasal washings and aspirates are unacceptable for Xpert Xpress SARS-CoV-2/FLU/RSV testing.  Fact Sheet for Patients: EntrepreneurPulse.com.au  Fact Sheet for Healthcare Providers: IncredibleEmployment.be  This test is not yet approved or cleared by the [place] FDA and has been authorized for detection and/or diagnosis of SARS-CoV-2 by FDA under an Emergency Use Authorization (EUA). This EUA will remain in effect (meaning this test can be used) for the duration of the COVID-19 declaration under Section 564(b)(1) of the Act, 21 U.S.C. section 360bbb-3(b)(1), unless the authorization is terminated  or revoked.  Performed at [facility], [address], [place], [place] [address]          Radiology Studies: No results found.      Scheduled Meds:  aspirin EC  81 mg Oral Daily   atorvastatin  40 mg Oral QPM   enoxaparin (LOVENOX) injection  40 mg Subcutaneous Q24H   famotidine  20 mg Oral BID   feeding supplement  237 mL Oral BID BM   lactose free nutrition  237 mL Oral Q24H   liver oil-zinc oxide   Topical BID   metoprolol tartrate  25 mg Oral BID   polyethylene glycol  17 g Oral BID   senna-docusate  1 tablet Oral BID   tamsulosin  0.8 mg Oral QPM   vitamin B-12  1,000 mcg Oral Daily   Continuous Infusions:  LOS: 8 days   Time spent: 28mins Greater than 50% of this time was spent in counseling, explanation of diagnosis, planning of further management, and coordination of care.   Voice Recognition Viviann Spare dictation system was used to create this note, attempts have been made to correct errors. Please contact the author with questions and/or clarifications.   Florencia Reasons, MD PhD FACP [facility]  Available via Epic secure chat 7am-7pm for nonurgent issues Please page for urgent issues To page the attending provider between 7A-7P or the covering provider during after hours 7P-7A, please log into the web site [email or web address] and access using universal [place] password for that web site. If you do not have the password, please call the hospital operator.    08/14/2020, 10:51 AM

## 2020-08-14 NOTE — Consult Note (Signed)
WOC Nurse Consult Note: Reason for Consult: Assessment of ecchymosis vs DTPI to left hip and guidance for care, also assessment of MASD at coccygeal area/apex of gluteal cleft (not pressure). Wound type: trauma, moisture Pressure Injury POA: N/A Measurement: Left hip protuberance with 3cm x 2cm area of ecchymosis vs DTPI.  Suspect ecchymosis due to recent fall on this location. Apex of gluteal cleft with discoloration (deeper hues), maceration and minute areas of partial thickness skin loss, pink, no exudate. This is consistent with MASD. Wound bed: As described above Drainage (amount, consistency, odor) As described above Periwound: intact, dry Dressing procedure/placement/frequency: Patient with frailty, nonambulatory status, muscle wasting. My associate saw him last Friday for a skin tear on the hand; see note from that encounter.  I will add a mattress replacement with low air loss feature, bilateral Prevalon pressure redistribution heel boots and provide guidance for care of the left hip lesion and coccygeal area. Turning and repositioning from side to side with minimizing time in the supine position is recommended.  [place] nursing team will not follow, but will remain available to this patient, the nursing and medical teams.  Please re-consult if needed. Thanks, Maudie Flakes, MSN, RN, [place], Arther Abbott  Pager# [phone]

## 2020-08-14 NOTE — TOC Progression Note (Signed)
Transition of Care [facility]) - Progression Note   Patient Details  Name: Adam Owen MRN: [id number] Date of Birth: May 06, 1940  Transition of Care [facility]) CM/SW [place], LCSW Phone Number: 08/14/2020, 3:42 PM  Clinical Narrative: CSW called [facility] and spoke with Bella Kennedy regarding SNF approval for the patient. Per Bella Kennedy, the patient has been authorized for 3 days of rehab and the auth # is [id number]. Continued stay clinicals will need to be faxed to [phone]. When asked if patient was officially approved for SNF, Bella Kennedy stated there is no start date as "it is not a done deal yet." CSW asked if patient will need a separate authorization for EMS. Bella Kennedy was unable to answer this question, so CSW was told someone else from [place] would call CSW. Bella Kennedy initially stated CSW would have to use a preferred EMS provider in [place]. CSW explained patient is in [place].  CSW received call from [facility] with [facility] requesting Tax ID information, a contact person, and email address for sending the single case agreement. CSW requested the agreement be faxed to [facility]. Jocelyn refused to send it by fax and requested an email.  CSW left message for Armanda Heritage [phone]) with [facility] requesting call back. CSW received call back and explained the disjointed process of trying to get approval for SNF and EMS. CSW also explained patient is medically ready, so the hospital will need to know if the patient will be here through the weekend awaiting approval. TOC awaiting update from [facility].  Expected Discharge Plan: [place] Barriers to Discharge: Continued Medical Work up, SNF Pending bed offer, Ship broker  Expected Discharge Plan and Services Expected Discharge Plan: [place] In-house Referral: Clinical Social Work Post Acute Care Choice: [place] Living arrangements for the past 2 months: Single Family Home               DME Arranged: N/A DME Agency: NA  Readmission Risk Interventions No flowsheet data found.

## 2020-08-14 NOTE — Telephone Encounter (Signed)
CN contacted Broadus John on progress of SNF for dad.  She reports that they are still waiting to see if [facility] will accept [facility]'s offer.  If they don't, daughters will try [facility] in [place].  Social worker, Jinny Blossom reports that she did not hear back from [facility] in [place] and [facility] in [place] denied entry for patient.

## 2020-08-15 MED ORDER — ENOXAPARIN SODIUM 40 MG/0.4ML IJ SOSY: 40.0000 mg | PREFILLED_SYRINGE | INTRAMUSCULAR | 0 refills | Status: AC

## 2020-08-15 NOTE — Progress Notes (Signed)
PROGRESS NOTE    Adam Owen  [id number] DOB: 01/22/1941 DOA: 08/06/2020 PCP: Pcp, No    Chief Complaint  Patient presents with   Fall    Brief Narrative:  History of CAD, pulmonary hypertension, pulmonary fibrosis, memory loss, gout, hypertension , dysphagia, weight loss, patient has been having more frequent falls lately and due to his social situations, he has been living with his daughter.  He had an unwitnessed fall at home where he landed on his right hip and was unable to get up or bear any weight. Found to have right inferior pubic ramus fracture , right greater trochanter fracture , right acetabular fractures and right femur fracture, due to patient borderline normal ambulatory status at baseline Ortho recommended conservative management.  Weightbearing as tolerated per Ortho recommendation ,awaiting for skilled nursing facility placement  Subjective:  He is taking a nap in bed when I entered the room, I woke him up, he is sleepy but does not appear to be in acute distress, his daughter Is not in room, he said his daughter will be back later today    is hard of hearing, Awaiting for snf placement  Assessment & Plan:   Active Problems:   Closed right hip fracture (HCC)   Protein-calorie malnutrition, severe   Acute nondisplaced fracture of right greater trochanter, right medial and posterior acetabulum, right femur Acute mildly displaced fracture of right inferior pubic ramus -Ortho Dr. Mardelle Matte consulted who recommended serial x-rays, conservative management, currently recommend weight-bear as tolerated, skilled nursing facility placement -Currently on Lovenox subcu injection for DVT prophylaxis -Patient has out-of-state insurance, appreciate social worker assistance  Dysphagia -speech initially recommended full liquid diet with nectar thick liquid, medication to be crushed with pure, Hard cough after swallow;Multiple dry swallows after each  bite/sip -daughter requested diet to be advanced, speech reval on 6/23 , now on dysphagia 3 diet, daughter is educated on risk of aspiration pneumonia, continue aspiration precaution   Macrocytic anemia, likely from nutrition deficit and anemia of chronic disease Hemoglobin around 8 in the last few days, MCV 107 B12 in 400 range,  started on Z30 supplement, folic acid unremarkable ,retic counts inappropriately low, and iron panel iron sat 10% Plan to start iron supplement once constipation resolves  Thrombocytopenia Platelet nadir at 126, has been normal for the last few days  Hypertension/hyperlipidemia Continue home medication Lopressor and atorvastatin  Irregular heart beat, no chest pain, no sob, no syncope, will get an ekg  Addendum: ekg showed sinus rhythm Irregular heart beat likely from occasional ectopic beats  Constipation Remain constipated, continue stool softener, add on suppository prn  BPH Continue Flomax  History of gout Uric acid 4.3 Does not appear to have acute issues    Severe malnutrition in the context of chronic illness/underweight Nutritional Assessment: The patient's BMI is: Body mass index is 15.92 kg/m.Marland Kitchen Seen by dietician.  I agree with the assessment and plan as outlined below: Nutrition Status: Nutrition Problem: Severe Malnutrition Etiology: dysphagia, chronic illness Signs/Symptoms: energy intake < or equal to 75% for > or equal to 1 month, severe fat depletion Interventions: Ensure Enlive (each supplement provides 350kcal and 20 grams of protein), Hormel Shake, MVI, Magic cup, Boost Plus  .Left hand wound and skin tear Patient has a full-thickness skin tear which occurred when he hit his hand.  His skin is very thin and fragile with several bruised areas from the location.  The nursing staff has applied 3 Steri-Strips and Xeroform gauze to promote  healing and recommending topical treatment orders continue the Xeroform gauze to the left hand  every day and leave the Steri-Strips in place until they fall off and covering with foam dressings for 3 days  Sacral wound appear to be moisture related, wound care input appreciated   Unresulted Labs (From admission, onward)    None         DVT prophylaxis: enoxaparin (LOVENOX) injection 40 mg Start: 08/06/20 1600   Code Status: Full Family Communication: Daughter over  the phone  Disposition:   Status is: Inpatient  Dispo: The patient is from: Home              Anticipated d/c is to: SNF              Anticipated d/c date is: He is medically ready to discharge, however he has out-of-state insurance, awaiting for insurance approval and SNF bed                Consultants:  Orthopedics Dr. Mardelle Matte Wound care  Procedures:  None  Antimicrobials:   Anti-infectives (From admission, onward)    None           Objective: Vitals:   08/14/20 1600 08/14/20 2223 08/15/20 0615 08/15/20 1344  BP:  (!) 106/54 (!) 100/50 129/64  Pulse:  71 66 65  Resp:  16 16 18   Temp:  97.9 F (36.6 C) 98.5 F (36.9 C) 97.8 F (36.6 C)  TempSrc:  Oral Oral   SpO2: 92% 98% 98% 90%  Weight:      Height:        Intake/Output Summary (Last 24 hours) at 08/15/2020 1715 Last data filed at 08/15/2020 1637 Gross per 24 hour  Intake 690 ml  Output 1270 ml  Net -580 ml   Filed Weights   08/08/20 0500 08/09/20 0500 08/12/20 0500  Weight: 50.1 kg 48.9 kg 48.9 kg    Examination:  General exam: Thin , underweight, frail , chronically ill-appearing ,hard of hearing , not oriented to time but to place and person , oriented to situation  Respiratory system: Clear to auscultation. Respiratory effort normal. Cardiovascular system: S1 & S2 heard, appear irregular,  No pedal edema. Gastrointestinal system: Abdomen is nondistended, soft and nontender.  Normal bowel sounds heard. Central nervous system: Alert and oriented.  Place and person, not to time. Extremities: Generalized weakness,  baseline largely nonambulatory Skin: Scattered ecchymosis upper extremities Psychiatry: Calm and cooperative, does appear to have memory impairment    Data Reviewed: I have personally reviewed following labs and imaging studies  CBC: Recent Labs  Lab 08/10/20 0415 08/11/20 0407 08/12/20 0405 08/13/20 0326 08/14/20 0339  WBC 8.8 9.7 8.8 10.8* 10.4  NEUTROABS 6.9 7.4 6.8 8.4* 8.0*  HGB 8.0* 8.1* 8.2* 8.0* 8.0*  HCT 24.6* 25.0* 25.4* 25.1* 25.2*  MCV 108.4* 108.2* 107.2* 107.3* 109.1*  PLT 140* 151 162 182 161    Basic Metabolic Panel: Recent Labs  Lab 08/09/20 0917 08/10/20 0415 08/11/20 0407 08/12/20 0405 08/13/20 0326 08/14/20 0339  NA 138 140 138 137 138 136  K 4.0 4.6 4.8 4.6 4.5 4.5  CL 103 107 103 102 102 103  CO2 31 30 31  32 32 31  GLUCOSE 104* 95 101* 98 102* 104*  BUN 13 16 22  27* 32* 27*  CREATININE 0.78 0.79 0.83 0.92 0.89 1.01  CALCIUM 8.8* 9.0 9.3 9.4 9.4 9.1  MG 2.0 1.9 2.0 2.1 1.9  --   PHOS 2.2* 2.6 3.5  3.3 3.0  --     GFR: Estimated Creatinine Clearance: 41 mL/min (by C-G formula based on SCr of 1.01 mg/dL).  Liver Function Tests: Recent Labs  Lab 08/09/20 0917 08/10/20 0415 08/11/20 0407 08/12/20 0405 08/13/20 0326  AST 24 28 20 29 21   ALT 23 24 22 22 22   ALKPHOS 84 84 85 90 77  BILITOT 0.5 0.6 0.4 0.8 0.6  PROT 6.0* 5.9* 6.2* 6.4* 6.2*  ALBUMIN 2.5* 2.4* 2.5* 2.6* 2.6*    CBG: No results for input(s): GLUCAP in the last 168 hours.   Recent Results (from the past 240 hour(s))  Resp Panel by RT-PCR (Flu A&B, Covid) Nasopharyngeal Swab     Status: None   Collection Time: 08/06/20  8:26 AM   Specimen: Nasopharyngeal Swab; Nasopharyngeal(NP) swabs in vial transport medium  Result Value Ref Range Status   SARS Coronavirus 2 by RT PCR NEGATIVE NEGATIVE Final    Comment: (NOTE) SARS-CoV-2 target nucleic acids are NOT DETECTED.  The SARS-CoV-2 RNA is generally detectable in upper respiratory specimens during the acute phase of  infection. The lowest concentration of SARS-CoV-2 viral copies this assay can detect is 138 copies/mL. A negative result does not preclude SARS-Cov-2 infection and should not be used as the sole basis for treatment or other patient management decisions. A negative result may occur with  improper specimen collection/handling, submission of specimen other than nasopharyngeal swab, presence of viral mutation(s) within the areas targeted by this assay, and inadequate number of viral copies(<138 copies/mL). A negative result must be combined with clinical observations, patient history, and epidemiological information. The expected result is Negative.  Fact Sheet for Patients:  EntrepreneurPulse.com.au  Fact Sheet for Healthcare Providers:  IncredibleEmployment.be  This test is no t yet approved or cleared by the [place] FDA and  has been authorized for detection and/or diagnosis of SARS-CoV-2 by FDA under an Emergency Use Authorization (EUA). This EUA will remain  in effect (meaning this test can be used) for the duration of the COVID-19 declaration under Section 564(b)(1) of the Act, 21 U.S.C.section 360bbb-3(b)(1), unless the authorization is terminated  or revoked sooner.       Influenza A by PCR NEGATIVE NEGATIVE Final   Influenza B by PCR NEGATIVE NEGATIVE Final    Comment: (NOTE) The Xpert Xpress SARS-CoV-2/FLU/RSV plus assay is intended as an aid in the diagnosis of influenza from Nasopharyngeal swab specimens and should not be used as a sole basis for treatment. Nasal washings and aspirates are unacceptable for Xpert Xpress SARS-CoV-2/FLU/RSV testing.  Fact Sheet for Patients: EntrepreneurPulse.com.au  Fact Sheet for Healthcare Providers: IncredibleEmployment.be  This test is not yet approved or cleared by the [place] FDA and has been authorized for detection and/or diagnosis of SARS-CoV-2  by FDA under an Emergency Use Authorization (EUA). This EUA will remain in effect (meaning this test can be used) for the duration of the COVID-19 declaration under Section 564(b)(1) of the Act, 21 U.S.C. section 360bbb-3(b)(1), unless the authorization is terminated or revoked.  Performed at [facility], [address] [place], [place] [address]          Radiology Studies: No results found.      Scheduled Meds:  aspirin EC  81 mg Oral Daily   atorvastatin  40 mg Oral QPM   enoxaparin (LOVENOX) injection  40 mg Subcutaneous Q24H   famotidine  20 mg Oral BID   feeding supplement  237 mL Oral BID BM   lactose free  nutrition  237 mL Oral Q24H   liver oil-zinc oxide   Topical BID   metoprolol tartrate  25 mg Oral BID   polyethylene glycol  17 g Oral BID   protein supplement  1 Scoop Oral TID WC   senna-docusate  1 tablet Oral BID   tamsulosin  0.8 mg Oral QPM   vitamin B-12  1,000 mcg Oral Daily   Continuous Infusions:   LOS: 9 days   Time spent: 17mins Greater than 50% of this time was spent in counseling, explanation of diagnosis, planning of further management, and coordination of care.   Voice Recognition Viviann Spare dictation system was used to create this note, attempts have been made to correct errors. Please contact the author with questions and/or clarifications.   Florencia Reasons, MD PhD FACP [facility]  Available via Epic secure chat 7am-7pm for nonurgent issues Please page for urgent issues To page the attending provider between 7A-7P or the covering provider during after hours 7P-7A, please log into the web site [email or web address] and access using universal [place] password for that web site. If you do not have the password, please call the hospital operator.    08/15/2020, 5:15 PM

## 2020-08-15 NOTE — Progress Notes (Signed)
Physical Therapy Treatment Patient Details Name: Adam Owen MRN: [id number] DOB: 06-04-1940 Today's Date: 08/15/2020    History of Present Illness Patient is a 80 year old male with fall at home resulting in nondisplaced fracture of the right greater trochanter, nondisplaced fractures of the right medial and posterior acetabulum and mildly displaced fracture of the right inferior pubic ramus. Pt treated non-operatively. PMH includes previous falls, R hip replacement, hearing loss in left ear, coronary artery disease, constipation, osteoporosis, pulmonary fibrosis, anemia, cancer, memory loss    PT Comments    Pt very cooperative but continues to fatigue easily.  Pt ambulated limited distance      Follow Up Recommendations  SNF     Equipment Recommendations  None recommended by PT    Recommendations for Other Services       Precautions / Restrictions Precautions Precautions: Fall Precaution Comments: HOH; better in R ear Restrictions Weight Bearing Restrictions: No RLE Weight Bearing: Weight bearing as tolerated    Mobility  Bed Mobility               General bed mobility comments: Pt up in chair and requests back to same    Transfers Overall transfer level: Needs assistance Equipment used: Rolling walker (2 wheeled);None Transfers: Sit to/from Stand Sit to Stand: Min assist;Mod assist;+2 physical assistance;+2 safety/equipment;From elevated surface         General transfer comment: cues for LE management and use of UEs to self assist.  Physical assist to bring wt up and fwd and to balance in standing.  Ambulation/Gait Ambulation/Gait assistance: Min assist;+2 safety/equipment Gait Distance (Feet): 23 Feet (13' + 10' with seated rest break) Assistive device: Rolling walker (2 wheeled) Gait Pattern/deviations: Step-to pattern;Decreased step length - right;Decreased step length - left;Trunk flexed;Narrow base of support Gait velocity: decr    General Gait Details: cues for posture and position from RW;   Chief Strategy Officer    Modified Rankin (Stroke Patients Only)       Balance Overall balance assessment: Needs assistance Sitting-balance support: Feet supported;Bilateral upper extremity supported Sitting balance-Leahy Scale: Fair     Standing balance support: Bilateral upper extremity supported Standing balance-Leahy Scale: Poor                              Cognition Arousal/Alertness: Awake/alert Behavior During Therapy: WFL for tasks assessed/performed Overall Cognitive Status: History of cognitive impairments - at baseline                                 General Comments: Pt appropriate during therapy, required intermittent repeated cues for command following.      Exercises      General Comments        Pertinent Vitals/Pain Pain Assessment: Faces Faces Pain Scale: Hurts little more Pain Location: bottom Pain Descriptors / Indicators: Grimacing;Aching Pain Intervention(s): Limited activity within patient's tolerance;Monitored during session;Premedicated before session    Home Living                      Prior Function            PT Goals (current goals can now be found in the care plan section) Acute Rehab PT Goals Patient Stated Goal: use commode PT Goal Formulation: With patient/family Time For  Goal Achievement: 08/21/20 Potential to Achieve Goals: Fair Progress towards PT goals: Progressing toward goals    Frequency    Min 3X/week      PT Plan Current plan remains appropriate    Co-evaluation              AM-PAC PT "6 Clicks" Mobility   Outcome Measure  Help needed turning from your back to your side while in a flat bed without using bedrails?: A Lot Help needed moving from lying on your back to sitting on the side of a flat bed without using bedrails?: A Lot Help needed moving to and from a bed to a chair  (including a wheelchair)?: A Lot Help needed standing up from a chair using your arms (e.g., wheelchair or bedside chair)?: A Lot Help needed to walk in hospital room?: A Lot Help needed climbing 3-5 steps with a railing? : Total 6 Click Score: 11    End of Session Equipment Utilized During Treatment: Gait belt Activity Tolerance: Patient tolerated treatment well;Patient limited by fatigue Patient left: in chair;with call bell/phone within reach;with chair alarm set;with family/visitor present Nurse Communication: Mobility status PT Visit Diagnosis: Unsteadiness on feet (R26.81);Muscle weakness (generalized) (M62.81);History of falling (Z91.81);Difficulty in walking, not elsewhere classified (R26.2);Pain Pain - Right/Left: Right Pain - part of body: Hip     Time: 1128-1203 PT Time Calculation (min) (ACUTE ONLY): 35 min  Charges:  $Gait Training: 23-37 mins                     [place] Pager [phone] Office [phone]    Rosana Farnell 08/15/2020, 1:39 PM

## 2020-08-15 NOTE — TOC Progression Note (Signed)
Transition of Care [facility]) - Progression Note   Patient Details  Name: Adam Owen MRN: [id number] Date of Birth: Jul 02, 1940  Transition of Care [facility]) CM/SW [place], LCSW Phone Number: 08/15/2020, 3:59 PM  Clinical Narrative: CSW spoke with Jenny Reichmann with [facility] and was informed the patient's SNF authorization is [id number]. EMS transport is pending approval. Once ambulance transport has been approved, CSW will need to call [phone] and request Emergency Care Management (ECM) and will need to explain the patient is out of the service area. [facility] is waiting for [facility] to accept the offer.  CSW called Juliann Pulse with [facility] and was informed the offer was not received by the facility. CSW called [facility] back and spoke with [place]. CSW requested that the offer be faxed to [facility] today. CSW left voicemail for daughter updating her regarding the delays. Patient will be here through the weekend. TOC to follow.  Expected Discharge Plan: [place] Barriers to Discharge: Continued Medical Work up, SNF Pending bed offer, Ship broker  Expected Discharge Plan and Services Expected Discharge Plan: [place] In-house Referral: Clinical Social Work Post Acute Care Choice: [place] Living arrangements for the past 2 months: Single Family Home            DME Arranged: N/A DME Agency: NA  Readmission Risk Interventions No flowsheet data found.

## 2020-08-16 MED ORDER — METOPROLOL TARTRATE 12.5 MG HALF TABLET
12.5000 mg | ORAL_TABLET | Freq: Two times a day (BID) | ORAL | Status: DC
  Administered 2020-08-16 – 2020-08-18 (×3): 12.5 mg via ORAL
  Filled 2020-08-16 (×3): qty 1

## 2020-08-16 MED ORDER — DICLOFENAC SODIUM 1 % EX GEL
2.0000 g | Freq: Four times a day (QID) | CUTANEOUS | Status: DC | PRN
  Filled 2020-08-16: qty 100

## 2020-08-16 NOTE — Progress Notes (Signed)
PROGRESS NOTE    Adam Owen  [id number] DOB: 01/04/1941 DOA: 08/06/2020 PCP: Pcp, No    Chief Complaint  Patient presents with   Fall    Brief Narrative:  History of CAD, pulmonary hypertension, pulmonary fibrosis, memory loss, gout, hypertension , dysphagia, weight loss, patient has been having more frequent falls lately and due to his social situations, he has been living with his daughter.  He had an unwitnessed fall at home where he landed on his right hip and was unable to get up or bear any weight. Found to have right inferior pubic ramus fracture , right greater trochanter fracture , right acetabular fractures and right femur fracture, due to patient borderline normal ambulatory status at baseline Ortho recommended conservative management.  Weightbearing as tolerated per Ortho recommendation ,awaiting for skilled nursing facility placement  Subjective:  Sitting up in chair, eat 80% of the breakfast No acute interval event Hard of hearing  Daughter at bedside Medically stable , awaiting for snf placement   Assessment & Plan:   Active Problems:   Closed right hip fracture (HCC)   Protein-calorie malnutrition, severe   Acute nondisplaced fracture of right greater trochanter, right medial and posterior acetabulum, right femur Acute mildly displaced fracture of right inferior pubic ramus -Ortho Dr. Mardelle Matte consulted who recommended serial x-rays, conservative management, currently recommend weight-bear as tolerated, skilled nursing facility placement -Currently on Lovenox subcu injection for DVT prophylaxis -Patient has out-of-state insurance, appreciate social worker assistance  Dysphagia -speech initially recommended full liquid diet with nectar thick liquid, medication to be crushed with pure, Hard cough after swallow;Multiple dry swallows after each bite/sip -daughter requested diet to be advanced, speech reval on 6/23 , now on dysphagia 3 diet, daughter is  educated on risk of aspiration pneumonia, continue aspiration precaution   Macrocytic anemia, likely from nutrition deficit and anemia of chronic disease Hemoglobin around 8 in the last few days, MCV 107 B12 in 400 range,  started on Y30 supplement, folic acid unremarkable ,retic counts inappropriately low, and iron panel iron sat 10% Plan to start iron supplement once constipation resolves  Thrombocytopenia Platelet nadir at 126, has been normal for the last few days  Hypertension/hyperlipidemia Continue home medication Lopressor and atorvastatin  EKG showed sinus rhythm, Irregular heart beat likely from occasional ectopic beats  Constipation Remain constipated, continue stool softener, add on suppository prn  BPH Continue Flomax  History of gout Uric acid 4.3 Does not appear to have acute issues    Severe malnutrition in the context of chronic illness/underweight Nutritional Assessment: The patient's BMI is: Body mass index is 15.92 kg/m.Marland Kitchen Seen by dietician.  I agree with the assessment and plan as outlined below: Nutrition Status: Nutrition Problem: Severe Malnutrition Etiology: dysphagia, chronic illness Signs/Symptoms: energy intake < or equal to 75% for > or equal to 1 month, severe fat depletion Interventions: Ensure Enlive (each supplement provides 350kcal and 20 grams of protein), Hormel Shake, MVI, Magic cup, Boost Plus  .Left hand wound and skin tear Patient has a full-thickness skin tear which occurred when he hit his hand.  His skin is very thin and fragile with several bruised areas from the location.  The nursing staff has applied 3 Steri-Strips and Xeroform gauze to promote healing and recommending topical treatment orders continue the Xeroform gauze to the left hand every day and leave the Steri-Strips in place until they fall off and covering with foam dressings for 3 days  Sacral wound appear to be moisture related,  wound care input appreciated    Unresulted Labs (From admission, onward)    None         DVT prophylaxis: enoxaparin (LOVENOX) injection 40 mg Start: 08/06/20 1600   Code Status: Full Family Communication: Daughter at bedside  Disposition:   Status is: Inpatient  Dispo: The patient is from: Home              Anticipated d/c is to: SNF              Anticipated d/c date is: He is medically ready to discharge, however he has out-of-state insurance, awaiting for insurance approval and SNF bed                Consultants:  Orthopedics Dr. Mardelle Matte Wound care  Procedures:  None  Antimicrobials:   Anti-infectives (From admission, onward)    None           Objective: Vitals:   08/15/20 0615 08/15/20 1344 08/15/20 2133 08/16/20 0531  BP: (!) 100/50 129/64 (!) 103/48 (!) 98/43  Pulse: 66 65 66 63  Resp: 16 18 18 14   Temp: 98.5 F (36.9 C) 97.8 F (36.6 C) 98.9 F (37.2 C) 97.6 F (36.4 C)  TempSrc: Oral  Oral Oral  SpO2: 98% 90% 97% 97%  Weight:      Height:        Intake/Output Summary (Last 24 hours) at 08/16/2020 1245 Last data filed at 08/16/2020 0900 Gross per 24 hour  Intake 710 ml  Output 900 ml  Net -190 ml   Filed Weights   08/08/20 0500 08/09/20 0500 08/12/20 0500  Weight: 50.1 kg 48.9 kg 48.9 kg    Examination:  General exam: Thin , underweight, frail , chronically ill-appearing ,hard of hearing , not oriented to time but to place and person , oriented to situation  Respiratory system: Clear to auscultation. Respiratory effort normal. Cardiovascular system: S1 & S2 heard, appear irregular,  No pedal edema. Gastrointestinal system: Abdomen is nondistended, soft and nontender.  Normal bowel sounds heard. Central nervous system: Alert and oriented.  Place and person, not to time. Extremities: Generalized weakness, baseline largely nonambulatory Skin: Scattered ecchymosis upper extremities Psychiatry: Calm and cooperative, does appear to have memory impairment    Data  Reviewed: I have personally reviewed following labs and imaging studies  CBC: Recent Labs  Lab 08/10/20 0415 08/11/20 0407 08/12/20 0405 08/13/20 0326 08/14/20 0339  WBC 8.8 9.7 8.8 10.8* 10.4  NEUTROABS 6.9 7.4 6.8 8.4* 8.0*  HGB 8.0* 8.1* 8.2* 8.0* 8.0*  HCT 24.6* 25.0* 25.4* 25.1* 25.2*  MCV 108.4* 108.2* 107.2* 107.3* 109.1*  PLT 140* 151 162 182 673    Basic Metabolic Panel: Recent Labs  Lab 08/10/20 0415 08/11/20 0407 08/12/20 0405 08/13/20 0326 08/14/20 0339  NA 140 138 137 138 136  K 4.6 4.8 4.6 4.5 4.5  CL 107 103 102 102 103  CO2 30 31 32 32 31  GLUCOSE 95 101* 98 102* 104*  BUN 16 22 27* 32* 27*  CREATININE 0.79 0.83 0.92 0.89 1.01  CALCIUM 9.0 9.3 9.4 9.4 9.1  MG 1.9 2.0 2.1 1.9  --   PHOS 2.6 3.5 3.3 3.0  --     GFR: Estimated Creatinine Clearance: 41 mL/min (by C-G formula based on SCr of 1.01 mg/dL).  Liver Function Tests: Recent Labs  Lab 08/10/20 0415 08/11/20 0407 08/12/20 0405 08/13/20 0326  AST 28 20 29 21   ALT 24 22 22  22  ALKPHOS 84 85 90 77  BILITOT 0.6 0.4 0.8 0.6  PROT 5.9* 6.2* 6.4* 6.2*  ALBUMIN 2.4* 2.5* 2.6* 2.6*    CBG: No results for input(s): GLUCAP in the last 168 hours.   No results found for this or any previous visit (from the past 240 hour(s)).        Radiology Studies: No results found.      Scheduled Meds:  aspirin EC  81 mg Oral Daily   atorvastatin  40 mg Oral QPM   enoxaparin (LOVENOX) injection  40 mg Subcutaneous Q24H   famotidine  20 mg Oral BID   feeding supplement  237 mL Oral BID BM   lactose free nutrition  237 mL Oral Q24H   liver oil-zinc oxide   Topical BID   metoprolol tartrate  12.5 mg Oral BID   polyethylene glycol  17 g Oral BID   protein supplement  1 Scoop Oral TID WC   senna-docusate  1 tablet Oral BID   tamsulosin  0.8 mg Oral QPM   vitamin B-12  1,000 mcg Oral Daily   Continuous Infusions:   LOS: 10 days   Time spent: 20mins Greater than 50% of this time was spent  in counseling, explanation of diagnosis, planning of further management, and coordination of care.   Voice Recognition Viviann Spare dictation system was used to create this note, attempts have been made to correct errors. Please contact the author with questions and/or clarifications.   Florencia Reasons, MD PhD FACP [facility]  Available via Epic secure chat 7am-7pm for nonurgent issues Please page for urgent issues To page the attending provider between 7A-7P or the covering provider during after hours 7P-7A, please log into the web site [email or web address] and access using universal [place] password for that web site. If you do not have the password, please call the hospital operator.    08/16/2020, 12:45 PM

## 2020-08-16 NOTE — Plan of Care (Signed)
  Problem: Pain Managment: Goal: General experience of comfort will improve Outcome: Progressing   

## 2020-08-16 NOTE — Progress Notes (Signed)
Foam to sacral area changed twice this shift due to loose bm's, area just below the coccyx is pink and excoriated, desitin was applied twice as well, patient was repositioned with side pillow support but did not always stay where positioned, pillow placed under legs to elevate heels but patient also did not keep there for very long, dressing to left hand changed twice after patient had removed, site with dried blood, no active drainage, and 1 steri-strip present, will continue to monitor and attempt to reorient patient as he has woke twice tonight calling out for his wife.

## 2020-08-16 NOTE — Progress Notes (Signed)
Physical Therapy Treatment Patient Details Name: Adam Owen MRN: [id number] DOB: [birth year] Today's Date: 08/16/2020    History of Present Illness Patient is a 80 year old male with fall at home resulting in nondisplaced fracture of the right greater trochanter, nondisplaced fractures of the right medial and posterior acetabulum and mildly displaced fracture of the right inferior pubic ramus. Pt treated non-operatively. PMH includes previous falls, R hip replacement, hearing loss in left ear, coronary artery disease, constipation, osteoporosis, pulmonary fibrosis, anemia, cancer, memory loss    PT Comments    Pt continues very cooperative and progressing slowly with mobility but requiring increased time and significant assist for most tasks and impeded by c/o increased L knee pain.   Follow Up Recommendations  SNF     Equipment Recommendations  None recommended by PT    Recommendations for Other Services       Precautions / Restrictions Precautions Precautions: Fall Precaution Comments: HOH; better in R ear Restrictions Weight Bearing Restrictions: No RLE Weight Bearing: Weight bearing as tolerated    Mobility  Bed Mobility               General bed mobility comments: Pt up in chair and requests back to same    Transfers Overall transfer level: Needs assistance Equipment used: Rolling walker (2 wheeled);None Transfers: Sit to/from Stand Sit to Stand: Mod assist;+2 physical assistance         General transfer comment: cues for LE management and use of UEs to self assist.  Physical assist to bring wt up and fwd and to balance in standing. up/down x 3  Ambulation/Gait Ambulation/Gait assistance: Min assist;+2 safety/equipment Gait Distance (Feet): 20 Feet Assistive device: Rolling walker (2 wheeled) Gait Pattern/deviations: Step-to pattern;Decreased step length - right;Decreased step length - left;Trunk flexed;Narrow base of support Gait velocity:  decr   General Gait Details: Increased time with cues for posture and position from RW;   Stairs             Wheelchair Mobility    Modified Rankin (Stroke Patients Only)       Balance Overall balance assessment: Needs assistance Sitting-balance support: Feet supported;Bilateral upper extremity supported Sitting balance-Leahy Scale: Fair   Postural control: Posterior lean Standing balance support: Bilateral upper extremity supported Standing balance-Leahy Scale: Poor Standing balance comment: needs B UE support and up to mod                            Cognition Arousal/Alertness: Awake/alert Behavior During Therapy: WFL for tasks assessed/performed Overall Cognitive Status: History of cognitive impairments - at baseline                                 General Comments: Pt appropriate during therapy, required intermittent repeated cues for command following.      Exercises General Exercises - Lower Extremity Ankle Circles/Pumps: AROM;Both;15 reps;Supine Long Arc Quad: AROM;Both;20 reps;Seated Heel Slides: AAROM;Both;15 reps;Supine Hip ABduction/ADduction: AAROM;AROM;Both;15 reps;Supine    General Comments        Pertinent Vitals/Pain Pain Assessment: Faces Pain Score: 4  Pain Location: L knee; R hip Pain Descriptors / Indicators: Grimacing;Guarding Pain Intervention(s): Limited activity within patient's tolerance;Monitored during session;Premedicated before session    Home Living                      Prior Function  PT Goals (current goals can now be found in the care plan section) Acute Rehab PT Goals PT Goal Formulation: With patient/family Time For Goal Achievement: 08/21/20 Potential to Achieve Goals: Fair Progress towards PT goals: Progressing toward goals    Frequency    Min 3X/week      PT Plan Current plan remains appropriate    Co-evaluation              AM-PAC PT "6 Clicks"  Mobility   Outcome Measure  Help needed turning from your back to your side while in a flat bed without using bedrails?: A Lot Help needed moving from lying on your back to sitting on the side of a flat bed without using bedrails?: A Lot Help needed moving to and from a bed to a chair (including a wheelchair)?: A Lot Help needed standing up from a chair using your arms (e.g., wheelchair or bedside chair)?: A Lot Help needed to walk in hospital room?: A Lot Help needed climbing 3-5 steps with a railing? : Total 6 Click Score: 11    End of Session Equipment Utilized During Treatment: Gait belt Activity Tolerance: Patient tolerated treatment well;Patient limited by fatigue Patient left: in chair;with call bell/phone within reach;with chair alarm set;with family/visitor present Nurse Communication: Mobility status PT Visit Diagnosis: Unsteadiness on feet (R26.81);Muscle weakness (generalized) (M62.81);History of falling (Z91.81);Difficulty in walking, not elsewhere classified (R26.2);Pain Pain - Right/Left: Right Pain - part of body: Hip     Time: 2567-2091 PT Time Calculation (min) (ACUTE ONLY): 42 min  Charges:  $Gait Training: 8-22 mins $Therapeutic Exercise: 8-22 mins $Therapeutic Activity: 8-22 mins                     Debe Coder PT Acute Rehabilitation Services Pager [phone] Office [phone]    Perl Kerney 08/16/2020, 1:43 PM

## 2020-08-16 NOTE — Plan of Care (Signed)
  Problem: Nutrition: Goal: Adequate nutrition will be maintained Outcome: Progressing   Problem: Activity: Goal: Risk for activity intolerance will decrease Outcome: Progressing    Problem: Skin Integrity: Goal: Risk for impaired skin integrity will decrease Outcome: Progressing

## 2020-08-17 MED ORDER — FERROUS SULFATE 325 (65 FE) MG PO TABS
325.0000 mg | ORAL_TABLET | Freq: Every day | ORAL | Status: DC
  Administered 2020-08-18: 325 mg via ORAL
  Filled 2020-08-17: qty 1

## 2020-08-17 MED ORDER — POLYETHYLENE GLYCOL 3350 17 G PO PACK
17.0000 g | PACK | Freq: Every day | ORAL | Status: DC
  Filled 2020-08-17: qty 1

## 2020-08-17 NOTE — Plan of Care (Signed)
  Problem: Health Behavior/Discharge Planning: Goal: Ability to manage health-related needs will improve Outcome: Progressing   Problem: Activity: Goal: Risk for activity intolerance will decrease Outcome: Progressing   

## 2020-08-17 NOTE — Progress Notes (Addendum)
PROGRESS NOTE    Adam Owen  [id number] DOB: 02/18/1941 DOA: 08/06/2020 PCP: Pcp, No    Chief Complaint  Patient presents with   Fall    Brief Narrative:  History of CAD, pulmonary hypertension, pulmonary fibrosis, memory loss, gout, hypertension , dysphagia, weight loss, patient has been having more frequent falls lately and due to his social situations, he has been living with his daughter.  He had an unwitnessed fall at home where he landed on his right hip and was unable to get up or bear any weight. Found to have right inferior pubic ramus fracture , right greater trochanter fracture , right acetabular fractures and right femur fracture, due to patient borderline normal ambulatory status at baseline Ortho recommended conservative management.  Weightbearing as tolerated per Ortho recommendation ,awaiting for skilled nursing facility placement  Subjective:  Sitting up in chair,  He eats 20-50% of his meal by documentation  Having bm No acute interval event Hard of hearing  Daughter at bedside Medically stable , awaiting for snf placement   Assessment & Plan:   Active Problems:   Closed right hip fracture (HCC)   Protein-calorie malnutrition, severe   Acute nondisplaced fracture of right greater trochanter, right medial and posterior acetabulum, right femur Acute mildly displaced fracture of right inferior pubic ramus -Ortho Dr. Mardelle Matte consulted who recommended serial x-rays, conservative management, currently recommend weight-bear as tolerated, skilled nursing facility placement -Currently on Lovenox subcu injection for DVT prophylaxis -Patient has out-of-state insurance, appreciate social worker assistance  Dysphagia -speech initially recommended full liquid diet with nectar thick liquid, medication to be crushed with pure, Hard cough after swallow;Multiple dry swallows after each bite/sip -daughter requested diet to be advanced, speech reval on 6/23 , now  on dysphagia 3 diet, daughter is educated on risk of aspiration pneumonia, continue aspiration precaution   Macrocytic anemia, likely from nutrition deficit and anemia of chronic disease Hemoglobin around 8 in the last few days, MCV 107 B12 in 400 range,  started on P23 supplement, folic acid unremarkable ,retic counts inappropriately low, and iron panel iron sat 10% start on iron supplement now that  constipation has resolved.   Thrombocytopenia Platelet nadir at 126, has been normal for the last few days  Hypertension/hyperlipidemia Continue home medication Lopressor and atorvastatin  EKG showed sinus rhythm, Irregular heart beat likely from occasional ectopic beats  Constipation Remain constipated, continue stool softener, add on suppository prn Having bm  BPH Continue Flomax  History of gout Uric acid 4.3 Does not appear to have acute issues    Severe malnutrition in the context of chronic illness/underweight Nutritional Assessment: The patient's BMI is: Body mass index is 15.92 kg/m.Marland Kitchen Seen by dietician.  I agree with the assessment and plan as outlined below: Nutrition Status: Nutrition Problem: Severe Malnutrition Etiology: dysphagia, chronic illness Signs/Symptoms: energy intake < or equal to 75% for > or equal to 1 month, severe fat depletion Interventions: Ensure Enlive (each supplement provides 350kcal and 20 grams of protein), Hormel Shake, MVI, Magic cup, Boost Plus  .Left hand wound and skin tear Patient has a full-thickness skin tear which occurred when he hit his hand.  His skin is very thin and fragile with several bruised areas from the location.  The nursing staff has applied 3 Steri-Strips and Xeroform gauze to promote healing and recommending topical treatment orders continue the Xeroform gauze to the left hand every day and leave the Steri-Strips in place until they fall off and covering with foam  dressings for 3 days  Sacral wound appear to be moisture  related, wound care input appreciated   FTT: to SNF, will benefit palliative care following [facility]  Unresulted Labs (From admission, onward)     Start     Ordered   08/17/20 1538  SARS CORONAVIRUS 2 (TAT 6-24 HRS) Nasopharyngeal Nasopharyngeal Swab  Once,   R       Question Answer Comment  Is this test for diagnosis or screening Screening   Symptomatic for COVID-19 as defined by CDC No   Hospitalized for COVID-19 No   Admitted to ICU for COVID-19 No   Previously tested for COVID-19 Yes   Resident in a congregate (group) care setting No   Employed in healthcare setting No   Has patient completed COVID vaccination(s) (2 doses of Pfizer/Moderna 1 dose of [employer]) Unknown      08/17/20 1537              DVT prophylaxis: enoxaparin (LOVENOX) injection 40 mg Start: 08/06/20 1600   Code Status: Full Family Communication: Daughter at bedside  Disposition:   Status is: Inpatient  Dispo: The patient is from: Home              Anticipated d/c is to: SNF, recommend palliative care at snf              Anticipated d/c date is: He is medically ready to discharge, however he has out-of-state insurance, awaiting for insurance approval and SNF bed, covid screening ordered on Sunday afternoon                Consultants:  Orthopedics Dr. Mardelle Matte Wound care  Procedures:  None  Antimicrobials:   Anti-infectives (From admission, onward)    None           Objective: Vitals:   08/16/20 0531 08/16/20 2153 08/17/20 0627 08/17/20 1345  BP: (!) 98/43 102/63 (!) 104/45 (!) 104/47  Pulse: 63 62 66 68  Resp: 14 20 (!) 24 (!) 21  Temp: 97.6 F (36.4 C) 98.7 F (37.1 C) 98 F (36.7 C) 97.7 F (36.5 C)  TempSrc: Oral Oral Oral Oral  SpO2: 97% 95% 95% 99%  Weight:      Height:        Intake/Output Summary (Last 24 hours) at 08/17/2020 1538 Last data filed at 08/17/2020 1315 Gross per 24 hour  Intake 660 ml  Output 725 ml  Net -65 ml   Filed Weights   08/08/20  0500 08/09/20 0500 08/12/20 0500  Weight: 50.1 kg 48.9 kg 48.9 kg    Examination:  General exam: Thin , underweight, frail , chronically ill-appearing ,hard of hearing , not oriented to time but to place and person , oriented to situation  Respiratory system: Clear to auscultation. Respiratory effort normal. Cardiovascular system: S1 & S2 heard, appear irregular,  No pedal edema. Gastrointestinal system: Abdomen is nondistended, soft and nontender.  Normal bowel sounds heard. Central nervous system: Alert and oriented.  Place and person, not to time. Extremities: Generalized weakness, baseline largely nonambulatory Skin: Scattered ecchymosis upper extremities Psychiatry: Calm and cooperative, does appear to have memory impairment    Data Reviewed: I have personally reviewed following labs and imaging studies  CBC: Recent Labs  Lab 08/11/20 0407 08/12/20 0405 08/13/20 0326 08/14/20 0339  WBC 9.7 8.8 10.8* 10.4  NEUTROABS 7.4 6.8 8.4* 8.0*  HGB 8.1* 8.2* 8.0* 8.0*  HCT 25.0* 25.4* 25.1* 25.2*  MCV 108.2* 107.2*  107.3* 109.1*  PLT 151 162 182 478    Basic Metabolic Panel: Recent Labs  Lab 08/11/20 0407 08/12/20 0405 08/13/20 0326 08/14/20 0339  NA 138 137 138 136  K 4.8 4.6 4.5 4.5  CL 103 102 102 103  CO2 31 32 32 31  GLUCOSE 101* 98 102* 104*  BUN 22 27* 32* 27*  CREATININE 0.83 0.92 0.89 1.01  CALCIUM 9.3 9.4 9.4 9.1  MG 2.0 2.1 1.9  --   PHOS 3.5 3.3 3.0  --     GFR: Estimated Creatinine Clearance: 41 mL/min (by C-G formula based on SCr of 1.01 mg/dL).  Liver Function Tests: Recent Labs  Lab 08/11/20 0407 08/12/20 0405 08/13/20 0326  AST 20 29 21   ALT 22 22 22   ALKPHOS 85 90 77  BILITOT 0.4 0.8 0.6  PROT 6.2* 6.4* 6.2*  ALBUMIN 2.5* 2.6* 2.6*    CBG: No results for input(s): GLUCAP in the last 168 hours.   No results found for this or any previous visit (from the past 240 hour(s)).        Radiology Studies: No results  found.      Scheduled Meds:  aspirin EC  81 mg Oral Daily   atorvastatin  40 mg Oral QPM   enoxaparin (LOVENOX) injection  40 mg Subcutaneous Q24H   famotidine  20 mg Oral BID   feeding supplement  237 mL Oral BID BM   lactose free nutrition  237 mL Oral Q24H   liver oil-zinc oxide   Topical BID   metoprolol tartrate  12.5 mg Oral BID   polyethylene glycol  17 g Oral BID   senna-docusate  1 tablet Oral BID   tamsulosin  0.8 mg Oral QPM   vitamin B-12  1,000 mcg Oral Daily   Continuous Infusions:   LOS: 11 days   Time spent: 79mins Greater than 50% of this time was spent in counseling, explanation of diagnosis, planning of further management, and coordination of care.   Voice Recognition Viviann Spare dictation system was used to create this note, attempts have been made to correct errors. Please contact the author with questions and/or clarifications.   Florencia Reasons, MD PhD FACP [facility]  Available via Epic secure chat 7am-7pm for nonurgent issues Please page for urgent issues To page the attending provider between 7A-7P or the covering provider during after hours 7P-7A, please log into the web site [email or web address] and access using universal [place] password for that web site. If you do not have the password, please call the hospital operator.    08/17/2020, 3:38 PM

## 2020-08-17 NOTE — Progress Notes (Signed)
Physical Therapy Treatment Patient Details Name: Adam Owen MRN: [id number] DOB: [birth year] Today's Date: 08/17/2020    History of Present Illness Patient is a 80 year old male with fall at home resulting in nondisplaced fracture of the right greater trochanter, nondisplaced fractures of the right medial and posterior acetabulum and mildly displaced fracture of the right inferior pubic ramus. Pt treated non-operatively. PMH includes previous falls, R hip replacement, hearing loss in left ear, coronary artery disease, constipation, osteoporosis, pulmonary fibrosis, anemia, cancer, memory loss    PT Comments    Pt very motivated this date and with noted improvement in speed of movement and activity tolerance and with decreased assist for all tasks.   Follow Up Recommendations  SNF     Equipment Recommendations  None recommended by PT    Recommendations for Other Services       Precautions / Restrictions Precautions Precautions: Fall Precaution Comments: HOH; better in R ear Restrictions Weight Bearing Restrictions: No RLE Weight Bearing: Weight bearing as tolerated Other Position/Activity Restrictions: WBAT    Mobility  Bed Mobility               General bed mobility comments: Pt up in chair and requests back to same    Transfers Overall transfer level: Needs assistance Equipment used: Rolling walker (2 wheeled);None Transfers: Sit to/from Stand Sit to Stand: Min assist;Mod assist;+2 physical assistance;+2 safety/equipment         General transfer comment: cues for LE management and use of UEs to self assist.  Physical assist to bring wt up and fwd and to balance in standing. up/down x 2  Ambulation/Gait Ambulation/Gait assistance: Min assist;+2 safety/equipment Gait Distance (Feet): 32 Feet Assistive device: Rolling walker (2 wheeled) Gait Pattern/deviations: Step-to pattern;Decreased step length - right;Decreased step length - left;Trunk  flexed Gait velocity: decr   General Gait Details: Increased time with cues for posture and position from RW;   Chief Strategy Officer    Modified Rankin (Stroke Patients Only)       Balance Overall balance assessment: Needs assistance Sitting-balance support: Feet supported;Bilateral upper extremity supported Sitting balance-Leahy Scale: Fair Sitting balance - Comments: has a tendency to lean to the left. Postural control: Posterior lean Standing balance support: Bilateral upper extremity supported Standing balance-Leahy Scale: Poor Standing balance comment: needs B UE support and up to mod                            Cognition Arousal/Alertness: Awake/alert Behavior During Therapy: WFL for tasks assessed/performed Overall Cognitive Status: History of cognitive impairments - at baseline                                        Exercises      General Comments        Pertinent Vitals/Pain Pain Assessment: Faces Faces Pain Scale: Hurts little more Pain Location: L knee; R hip Pain Descriptors / Indicators: Discomfort;Guarding Pain Intervention(s): Limited activity within patient's tolerance;Monitored during session;Premedicated before session    Home Living                      Prior Function            PT Goals (current goals can now be found in the care  plan section) Acute Rehab PT Goals PT Goal Formulation: With patient/family Time For Goal Achievement: 08/21/20 Potential to Achieve Goals: Fair Progress towards PT goals: Progressing toward goals    Frequency    Min 3X/week      PT Plan Current plan remains appropriate    Co-evaluation              AM-PAC PT "6 Clicks" Mobility   Outcome Measure  Help needed turning from your back to your side while in a flat bed without using bedrails?: A Lot Help needed moving from lying on your back to sitting on the side of a flat bed without  using bedrails?: A Lot Help needed moving to and from a bed to a chair (including a wheelchair)?: A Lot Help needed standing up from a chair using your arms (e.g., wheelchair or bedside chair)?: A Lot Help needed to walk in hospital room?: A Lot Help needed climbing 3-5 steps with a railing? : Total 6 Click Score: 11    End of Session Equipment Utilized During Treatment: Gait belt Activity Tolerance: Patient tolerated treatment well;Patient limited by fatigue Patient left: in chair;with call bell/phone within reach;with chair alarm set Nurse Communication: Mobility status PT Visit Diagnosis: Unsteadiness on feet (R26.81);Muscle weakness (generalized) (M62.81);History of falling (Z91.81);Difficulty in walking, not elsewhere classified (R26.2);Pain Pain - Right/Left: Right Pain - part of body: Hip     Time: 1450-1511 PT Time Calculation (min) (ACUTE ONLY): 21 min  Charges:  $Gait Training: 8-22 mins                     [place] Pager [phone] Office [phone]    Miski Feldpausch 08/17/2020, 3:30 PM

## 2020-08-18 ENCOUNTER — Telehealth: Payer: Self-pay | Admitting: *Deleted

## 2020-08-18 LAB — SARS CORONAVIRUS 2 (TAT 6-24 HRS): SARS Coronavirus 2: NEGATIVE

## 2020-08-18 MED ORDER — COLCHICINE 0.6 MG PO TABS: 0.6000 mg | ORAL_TABLET | Freq: Every day | ORAL | Status: AC | PRN

## 2020-08-18 MED ORDER — CYANOCOBALAMIN 1000 MCG PO TABS: 1000.0000 ug | ORAL_TABLET | Freq: Every day | ORAL | Status: AC

## 2020-08-18 MED ORDER — SENNOSIDES-DOCUSATE SODIUM 8.6-50 MG PO TABS: 1.0000 | ORAL_TABLET | Freq: Two times a day (BID) | ORAL | Status: AC

## 2020-08-18 MED ORDER — FERROUS SULFATE 325 (65 FE) MG PO TABS: 325.0000 mg | ORAL_TABLET | Freq: Every day | ORAL | 3 refills | Status: AC

## 2020-08-18 MED ORDER — METOPROLOL TARTRATE 25 MG PO TABS: 12.5000 mg | ORAL_TABLET | Freq: Two times a day (BID) | ORAL | Status: AC

## 2020-08-18 MED ORDER — HYDROCODONE-ACETAMINOPHEN 5-325 MG PO TABS: 1.0000 | ORAL_TABLET | ORAL | 0 refills | Status: AC | PRN

## 2020-08-18 MED ORDER — DICLOFENAC SODIUM 1 % EX GEL: 2.0000 g | Freq: Four times a day (QID) | CUTANEOUS | Status: AC | PRN

## 2020-08-18 MED ORDER — POLYETHYLENE GLYCOL 3350 17 G PO PACK: 17.0000 g | PACK | Freq: Every day | ORAL | 0 refills | Status: AC

## 2020-08-18 MED ORDER — JUVEN PO PACK
1.0000 | PACK | Freq: Two times a day (BID) | ORAL | Status: DC
  Filled 2020-08-18: qty 1

## 2020-08-18 NOTE — Progress Notes (Signed)
Physical Therapy Treatment Patient Details Name: Adam Owen MRN: [id number] DOB: 1940/06/08 Today's Date: 08/18/2020    History of Present Illness Patient is a 80 year old male with fall at home resulting in nondisplaced fracture of the right greater trochanter, nondisplaced fractures of the right medial and posterior acetabulum and mildly displaced fracture of the right inferior pubic ramus. Pt treated non-operatively. PMH includes previous falls, R hip replacement, hearing loss in left ear, coronary artery disease, constipation, osteoporosis, pulmonary fibrosis, anemia, cancer, memory loss    PT Comments    Pt is slowly progressing toward acute PT goals. Pt continues to required increased assist with transfers/ambulation for safety and proper technique and demonstrates decreased activity tolerance due to fatigue. Pt ambulated total of 11ft today with MIN A +1 for stability and chair follow requiring seated rest break after 68ft. Pt tolerated increased repetitions with therapeutic exercises for LE strengthening well this date. Pt will benefit from continued skilled physical therapy in order to address listed impairments and maximize functional mobility.    Follow Up Recommendations  SNF     Equipment Recommendations  None recommended by PT    Recommendations for Other Services       Precautions / Restrictions Precautions Precautions: Fall Precaution Comments: HOH; better in R ear Restrictions Weight Bearing Restrictions: No RLE Weight Bearing: Weight bearing as tolerated    Mobility  Bed Mobility               General bed mobility comments: pt OOB in recliner with RN staff upon entry    Transfers Overall transfer level: Needs assistance Equipment used: Rolling walker (2 wheeled) Transfers: Sit to/from Stand Sit to Stand: Min assist;Mod assist;+2 physical assistance;+2 safety/equipment         General transfer comment: MIN-MOD A +2 for assist with fwd  weight shift and power up to stand. Pt with use of B UEs on recliner to assist and demonstrating posterior lean in standing.  Ambulation/Gait Ambulation/Gait assistance: Min assist;+2 safety/equipment Gait Distance (Feet): 16 Feet Assistive device: Rolling walker (2 wheeled) Gait Pattern/deviations: Step-to pattern;Decreased step length - right;Decreased step length - left;Trunk flexed Gait velocity: decr   General Gait Details: Pt ambulated total of 44ft with seated rest break at 42ft due to fatigue. reported 4/10 RPE. Pt required increased time with verbal and tactile cuing for upright posture and increased BOS.  MIN A +1 for stability/balance, no overt LOB noted, chair follow for safety.   Stairs             Wheelchair Mobility    Modified Rankin (Stroke Patients Only)       Balance Overall balance assessment: Needs assistance Sitting-balance support: Feet supported;Bilateral upper extremity supported Sitting balance-Leahy Scale: Fair   Postural control: Posterior lean Standing balance support: Bilateral upper extremity supported Standing balance-Leahy Scale: Poor Standing balance comment: use of RW and assist from PT to maintain standing balance                            Cognition Arousal/Alertness: Awake/alert Behavior During Therapy: WFL for tasks assessed/performed Overall Cognitive Status: History of cognitive impairments - at baseline                                        Exercises General Exercises - Lower Extremity Ankle Circles/Pumps: AROM;Both;20 reps;Seated Heel Slides:  AAROM;Both;Supine;20 reps Straight Leg Raises: AROM;Both;15 reps;Seated    General Comments        Pertinent Vitals/Pain Pain Assessment: No/denies pain Pain Score: 0-No pain    Home Living                      Prior Function            PT Goals (current goals can now be found in the care plan section) Acute Rehab PT Goals PT Goal  Formulation: With patient/family Time For Goal Achievement: 08/21/20 Potential to Achieve Goals: Fair Progress towards PT goals: Progressing toward goals    Frequency    Min 3X/week      PT Plan Current plan remains appropriate    Co-evaluation              AM-PAC PT "6 Clicks" Mobility   Outcome Measure  Help needed turning from your back to your side while in a flat bed without using bedrails?: A Lot Help needed moving from lying on your back to sitting on the side of a flat bed without using bedrails?: A Lot Help needed moving to and from a bed to a chair (including a wheelchair)?: A Lot Help needed standing up from a chair using your arms (e.g., wheelchair or bedside chair)?: A Lot Help needed to walk in hospital room?: A Lot Help needed climbing 3-5 steps with a railing? : Total 6 Click Score: 11    End of Session Equipment Utilized During Treatment: Gait belt Activity Tolerance: Patient tolerated treatment well;Patient limited by fatigue Patient left: in chair;with call bell/phone within reach;with chair alarm set Nurse Communication: Mobility status PT Visit Diagnosis: Unsteadiness on feet (R26.81);Muscle weakness (generalized) (M62.81);History of falling (Z91.81);Difficulty in walking, not elsewhere classified (R26.2)     Time: 1025-1050 PT Time Calculation (min) (ACUTE ONLY): 25 min  Charges:  $Gait Training: 8-22 mins $Therapeutic Exercise: 8-22 mins                     Festus Barren PT, DPT  Acute Rehabilitation Services  Office [phone]     08/18/2020, 11:06 AM

## 2020-08-18 NOTE — Progress Notes (Signed)
Pt discharged. Going to [facility], transported by daughter in her car.

## 2020-08-18 NOTE — Telephone Encounter (Signed)
CN telephoned patient's daughter, [name] to follow up on plan for patient's discharge.  She is still waiting to hear from social worker if agreement can be reached between [facility] and [facility] for her father to be released from hospital to their care.

## 2020-08-18 NOTE — Care Management Important Message (Signed)
Important Message  Patient Details IM Letter placed in Patient's room. Name: Adam Owen MRN: [id number] Date of Birth: September 23, 1940   Medicare Important Message Given:  Yes     Kerin Salen 08/18/2020, 3:04 PM

## 2020-08-18 NOTE — Discharge Summary (Signed)
Physician Discharge Summary  Adam Owen [id number] DOB: 03-24-1940 DOA: 08/06/2020  PCP: Pcp, No  Admit date: 08/06/2020 Discharge date: 08/18/2020  Admitted From: Home Disposition:  SNF  Discharge Condition:Stable CODE STATUS:FULL Diet recommendation:  Dysphagia 3  Brief/Interim Summary: Patient is a 80 year old male with PM history of CAD, pulmonary hypertension, pulmonary fibrosis, memory loss, gout, hypertension , dysphagia, weight loss, patient has been having more frequent falls lately and due to his social situations, he has been living with his daughter.  He had an unwitnessed fall at home where he landed on his right hip and was unable to get up or bear any weight. Found to have right inferior pubic ramus fracture , right greater trochanter fracture , right acetabular fractures and right femur fracture, due to patient borderline normal ambulatory status at baseline .Ortho recommended conservative management.  Weightbearing as tolerated per Ortho recommendation ,PT/OT recommended skilled nursing facility placement on DC.  Medically stable for discharge today.  Following problems were addressed during his hospitalization:   Acute nondisplaced fracture of right greater trochanter, right medial and posterior acetabulum, right femur,Acute mildly displaced fracture of right inferior pubic ramus -Ortho Dr. Mardelle Matte consulted who recommended serial x-rays, conservative management, currently recommend weight-bear as tolerated, skilled nursing facility placement -Currently on Lovenox subcu injection for DVT prophylaxis -recommended to follow-up with orthopedics in 2 weeks.   Dysphagia -speech initially recommended full liquid diet with nectar thick liquid, medication to be crushed with pure, Hard cough after swallow;Multiple dry swallows after each bite/sip -daughter requested diet to be advanced, speech reval on 6/23 , now on dysphagia 3 diet, daughter is educated on risk of  aspiration pneumonia, continue aspiration precaution   Macrocytic anemia, likely from nutrition deficit and anemia of chronic disease Hemoglobin around 8 in the last few days, MCV 107 B12 in 400 range,  started on W09 supplement, folic acid unremarkable ,retic counts inappropriately low, and iron panel iron sat 10% started on iron supplement     Thrombocytopenia Platelet nadir at 126, has been normal for the last few days   Hypertension/hyperlipidemia Continue home medication Lopressor and atorvastatin    Constipation Continue bowel regimen   BPH Continue Flomax   History of gout Uric acid 4.3 Does not appear to have acute issues On colchicine PRN   .Left hand wound and skin tear Patient has a full-thickness skin tear which occurred when he hit his hand.  His skin is very thin and fragile with several bruised areas from the location.  The nursing staff has applied 3 Steri-Strips and Xeroform gauze to promote healing and recommending topical treatment orders continue the Xeroform gauze to the left hand every day and leave the Steri-Strips in place until they fall off and covering with foam dressings for 3 days  Sacral wound appear to be moisture related    Debility/deconditioning: PT/OT recommended SN facility on discharge  Severe protein calorie malnutrition : Nutrition was following     Discharge Diagnoses:  Active Problems:   Closed right hip fracture (HCC)   Protein-calorie malnutrition, severe    Discharge Instructions  Discharge Instructions     Diet general   Complete by: As directed    Dysphagia 3   Discharge wound care:   Complete by: As directed    As above   Face-to-face encounter (required for Medicare/Medicaid patients)   Complete by: As directed    I Mariea Clonts certify that this patient is under my care and that I,  or a nurse practitioner or physician's assistant working with me, had a face-to-face encounter that meets the physician face-to-face  encounter requirements with this patient on 08/06/2020. The encounter with the patient was in whole, or in part for the following medical condition(s) which is the primary reason for home health care (List medical condition): unsafe ambulation without assistance, frequent falls   The encounter with the patient was in whole, or in part, for the following medical condition, which is the primary reason for home health care: deconditioned, ambulation challlengses, frequent falls   I certify that, based on my findings, the following services are medically necessary home health services:  Nursing Physical therapy     Reason for Medically Necessary Home Health Services: Skilled Nursing- Skilled Assessment/Observation   My clinical findings support the need for the above services: Unsafe ambulation due to balance issues   Further, I certify that my clinical findings support that this patient is homebound due to: Unable to leave home safely without assistance   Home Health   Complete by: As directed    Recently moved in with daughter, ambulation challenges, generally weak, needs social work to coordinate as well   To provide the following care/treatments:  PT [place] work     Increase activity slowly   Complete by: As directed       Allergies as of 08/18/2020       Reactions   Iodine         Medication List     TAKE these medications    acetaminophen 500 MG tablet Commonly known as: TYLENOL Take 500 mg by mouth every 6 (six) hours as needed.   alendronate-cholecalciferol 70-2800 MG-UNIT tablet Commonly known as: FOSAMAX PLUS D Take by mouth every 7 (seven) days. Take with a full glass of water on an empty stomach.   aspirin EC 81 MG tablet Take 81 mg by mouth daily. Swallow whole.   atorvastatin 40 MG tablet Commonly known as: LIPITOR Take 40 mg by mouth daily.   colchicine 0.6 MG tablet Take 1 tablet (0.6 mg total) by mouth daily as needed (Gout Flare).    cyanocobalamin 1000 MCG tablet Take 1 tablet (1,000 mcg total) by mouth daily. Start taking on: August 19, 2020   diclofenac Sodium 1 % Gel Commonly known as: VOLTAREN Apply 2 g topically 4 (four) times daily as needed (left knee pain).   enoxaparin 40 MG/0.4ML injection Commonly known as: LOVENOX Inject 0.4 mLs (40 mg total) into the skin daily for 20 doses. For 30 days post op for DVT prophylaxis   famotidine 20 MG tablet Commonly known as: PEPCID Take 20 mg by mouth 2 (two) times daily.   ferrous sulfate 325 (65 FE) MG tablet Take 1 tablet (325 mg total) by mouth daily with breakfast. Start taking on: August 19, 2020   HYDROcodone-acetaminophen 5-325 MG tablet Commonly known as: NORCO/VICODIN Take 1-2 tablets by mouth every 4 (four) hours as needed for moderate pain.   metoprolol tartrate 25 MG tablet Commonly known as: LOPRESSOR Take 0.5 tablets (12.5 mg total) by mouth 2 (two) times daily. What changed: how much to take   multivitamin with minerals Tabs tablet Take 1 tablet by mouth daily.   nitroGLYCERIN 0.4 MG SL tablet Commonly known as: NITROSTAT Place 0.4 mg under the tongue every 5 (five) minutes as needed for chest pain.   polyethylene glycol 17 g packet Commonly known as: MIRALAX / GLYCOLAX Take 17 g by mouth  daily. Start taking on: August 19, 2020   senna-docusate 8.6-50 MG tablet Commonly known as: Senokot-S Take 1 tablet by mouth 2 (two) times daily.   tamsulosin 0.4 MG Caps capsule Commonly known as: FLOMAX Take 0.8 mg by mouth every evening.               Discharge Care Instructions  (From admission, onward)           Start     Ordered   08/18/20 0000  Discharge wound care:       Comments: As above   08/18/20 1255            Follow-up [place]. Call .   Why: As needed Contact information: [address] [place]        Marchia Bond, MD.  Schedule an appointment as soon as possible for a visit in 2 week(s).   Specialty: Orthopedic Surgery Contact information: [address] [place] [address] [phone]                Allergies  Allergen Reactions   Iodine     Consultations: Orthopedics   Procedures/Studies: DG Pelvis 1-2 Views  Result Date: 08/06/2020 CLINICAL DATA:  Recent fall with right hip pain, initial encounter EXAM: PELVIS - 1-2 VIEW COMPARISON:  None. FINDINGS: Bilateral hip replacements are noted. No dislocation is noted. Lucency is seen in the greater trochanter suspicious for periprosthetic fracture. Multiple healed fractures are noted within the pelvis. Lucency is seen within the right acetabulum medially suspicious for acute nondisplaced fracture. CT of the pelvis may be helpful for further evaluation. IMPRESSION: Changes consistent with right greater trochanter periprosthetic fracture. Lucency in the medial aspect of the right acetabulum suspicious for undisplaced fracture. Electronically Signed   By: Inez Catalina M.D.   On: 08/06/2020 08:59   DG Pelvis Portable  Result Date: 08/12/2020 CLINICAL DATA:  Right hip fracture. EXAM: PORTABLE PELVIS 1-2 VIEWS COMPARISON:  CT 08/06/2020.  Pelvis 08/06/2020. FINDINGS: Fracture of the tip of the right greater trochanter again noted. Fracture of the right inferior pubic ramus again noted. These fractures and the right acetabular fractures best identified on prior CT. Bilateral total hip replacements. Visualized hardware intact. Diffuse osteopenia. Degenerative changes lumbar spine. Stable calcific density noted over the left hip. Peripheral vascular calcification. IMPRESSION: 1. Fracture of the tip of the right greater trochanter again noted. Fracture of the right inferior pubic ramus again noted. These fractures and the right acetabular fractures best identified by prior CT. Again a proximal right femoral shaft fracture was identified on  today's right femur exam. 2.  Bilateral total hip replacement.  Visualized hardware intact. Electronically Signed   By: Marcello Moores  Register   On: 08/12/2020 07:14   CT Hip Right Wo Contrast  Result Date: 08/06/2020 CLINICAL DATA:  Right hip pain since fall yesterday. EXAM: CT OF THE RIGHT HIP WITHOUT CONTRAST TECHNIQUE: Multidetector CT imaging of the right hip was performed according to the standard protocol. Multiplanar CT image reconstructions were also generated. COMPARISON:  Pelvis and right femur x-rays from same day. FINDINGS: Bones/Joint/Cartilage Prior right total hip arthroplasty. No evidence of hardware failure or loosening. Acute nondisplaced fracture of the right greater trochanter (series 6, image 22). Acute nondisplaced fractures of the right medial and posterior acetabulum (series 6, images 48 and 29). Acute mildly displaced fracture of the right inferior pubic ramus (series 3, image  29). Old healed fractures of the left superior and inferior pubic rami. No joint effusion. Ligaments Ligaments are suboptimally evaluated by CT. Muscles and Tendons Grossly intact.  Right gluteus muscle atrophy. Soft tissue Small hematoma overlying the greater trochanter. No soft tissue mass. IMPRESSION: 1. Acute nondisplaced fracture of the right greater trochanter. 2. Acute nondisplaced fractures of the right medial and posterior acetabulum. 3. Acute mildly displaced fracture of the right inferior pubic ramus. 4. Prior right total hip arthroplasty without evidence of hardware complication. Electronically Signed   By: Titus Dubin M.D.   On: 08/06/2020 10:24   DG Swallowing Func-Speech Pathology  Result Date: 08/08/2020 Formatting of this result is different from the original. Objective Swallowing Evaluation: Type of Study: MBS-Modified Barium Swallow Study  Patient Details Name: Adam Owen MRN: [id number] Date of Birth: 1940-07-02 Today's Date: 08/08/2020 Time: SLP Start Time (ACUTE ONLY): 8841 -SLP Stop  Time (ACUTE ONLY): 0910 SLP Time Calculation (min) (ACUTE ONLY): 35 min Past Medical History: Past Medical History: Diagnosis Date  Anemia   Cancer ([place])   Constipation   Coronary artery disease   Edema   Gait abnormality   GERD (gastroesophageal reflux disease)   Gout   High cholesterol   Hypertension   Leg weakness   Memory loss   Myocardial infarction ([place])   Obstructive sleep apnea   Osteoporosis   Premature heartbeats   Pulmonary fibrosis ([place])   Spinal stenosis of lumbar region   UTI (urinary tract infection)   Vertigo   Vitamin D deficiency  Past Surgical History: Past Surgical History: Procedure Laterality Date  CORONARY ARTERY BYPASS GRAFT    JOINT REPLACEMENT Bilateral  HPI: Patient is a 80 yo male [facility] after fall at his daughter's home.  Found to have hip fx but no surgical intervention warranted. 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 Pt reportedly has occasional nasal regurgitation and coughing with po intake.  He saw ENT with plans for swallow study but this was not completed as patient now admitted to hospital after a fall. Per ENT's note, pt has acquired velopharyngeal incompetence and no recurrence of lesions.  Weight loss reported prior to pt moving to [place] with his daughter.  He appears to be frail and deconditioned.  Subjective: pt awake in chair Assessment / Plan / Recommendation CHL IP CLINICAL IMPRESSIONS 08/08/2020 Clinical Impression Patient presents with mild oral and moderately severe pharyngeal dysphagia. Pt's dysphagia likely due to combination of anatomical anamolies including shortened palate, posterior lingual resection on right for cancer and deconditioning causing exacerbation.   Dysphagia evidenced by impaired velopharyngeal closure, compromise tongue base retraction resulting in decreased epiglottic deflection and impaired  airway protection. In addition patient with decreased pharyngeal motility and hyo- laryngeal elevation allowing penetration and aspiration and also pharyngeal retention. Aspiration and penetration occur before , During, and after the swallow due to the above deficits. Patient did not sense mild aspiration. Subtle throat clear noted with mild aspiration. He also did not consistently since pharyngeal retention but dry swallows and cough/hawk with expectoration facilitated pharyngeal clearance. Placing head of bed at 20 allowed residual to remain in posterior pharynx decreasing amount of penetration and aspiration. Pharyngeal retention was much worse with solids and pures. SLP suspects this patient's dysphagia is at his baseline, given report of swallow ability during MBS being normal for him. Advised patient and  daughter [name] to swallow precautions to mitigate aspiration and help maximize nutrition. Concerns for adequacy of nutrition present given level of dysphagia however anticipate patient would be able to consume adequate amounts of liquid nutrition. Reiterated that patient's tolerance of aspiration May be more compromised at this point given decreased mobility. Recommend very strict swallow precautions and compensation strategies. Doubtful for swallowing improvement given suspected chronicity of dysphagia. SLP Visit Diagnosis Dysphagia, pharyngoesophageal phase (R13.14);Dysphagia, unspecified (R13.10);Dysphagia, oropharyngeal phase (R13.12) Attention and concentration deficit following -- Frontal lobe and executive function deficit following -- Impact on safety and function Moderate aspiration risk;Risk for inadequate nutrition/hydration   CHL IP TREATMENT RECOMMENDATION 08/08/2020 Treatment Recommendations Therapy as outlined in treatment plan below   Prognosis 08/08/2020 Prognosis for Safe Diet Advancement Fair Barriers to Reach Goals Time post onset;Behavior Barriers/Prognosis Comment -- CHL IP DIET  RECOMMENDATION 08/08/2020 SLP Diet Recommendations Full liquids only, nectar thickened with meals, thin ok between meals without other intake Liquid Administration via Spoon;Straw Medication Administration Other (Comment) Compensations Slow rate;Small sips/bites;Other (Comment);Hard cough after swallow;Multiple dry swallows after each bite/sip;Effortful swallow Postural Changes Other (Comment)   CHL IP OTHER RECOMMENDATIONS 08/08/2020 Recommended Consults -- Oral Care Recommendations Oral care BID Other Recommendations Have oral suction available   CHL IP FOLLOW UP RECOMMENDATIONS 08/08/2020 Follow up Recommendations Skilled Nursing facility   [facility] IP FREQUENCY AND DURATION 08/08/2020 Speech Therapy Frequency (ACUTE ONLY) min 2x/week Treatment Duration 1 week      CHL IP ORAL PHASE 08/08/2020 Oral Phase Impaired Oral - Pudding Teaspoon -- Oral - Pudding Cup -- Oral - Honey Teaspoon -- Oral - Honey Cup -- Oral - Nectar Teaspoon Decreased velopharyngeal closure Oral - Nectar Cup -- Oral - Nectar Straw Decreased bolus cohesion;Decreased velopharyngeal closure Oral - Thin Teaspoon Decreased velopharyngeal closure Oral - Thin Cup -- Oral - Thin Straw Decreased velopharyngeal closure Oral - Puree Decreased velopharyngeal closure;Lingual pumping;Weak lingual manipulation;Piecemeal swallowing Oral - Mech Soft Decreased velopharyngeal closure;Impaired mastication;Weak lingual manipulation;Lingual pumping;Piecemeal swallowing;Decreased bolus cohesion;Delayed oral transit Oral - Regular -- Oral - Multi-Consistency -- Oral - Pill -- Oral Phase - Comment --  CHL IP PHARYNGEAL PHASE 08/08/2020 Pharyngeal Phase Impaired Pharyngeal- Pudding Teaspoon -- Pharyngeal -- Pharyngeal- Pudding Cup -- Pharyngeal -- Pharyngeal- Honey Teaspoon -- Pharyngeal -- Pharyngeal- Honey Cup -- Pharyngeal -- Pharyngeal- Nectar Teaspoon Reduced pharyngeal peristalsis;Reduced epiglottic inversion;Reduced anterior laryngeal mobility;Reduced laryngeal  elevation;Reduced airway/laryngeal closure;Reduced tongue base retraction;Penetration/Apiration after swallow;Pharyngeal residue - valleculae;Pharyngeal residue - pyriform;Pharyngeal residue - posterior pharnyx;Trace aspiration;Pharyngeal residue - cp segment Pharyngeal Material enters airway, passes BELOW cords without attempt by patient to eject out (silent aspiration) Pharyngeal- Nectar Cup -- Pharyngeal -- Pharyngeal- Nectar Straw Reduced pharyngeal peristalsis;Reduced epiglottic inversion;Reduced anterior laryngeal mobility;Reduced laryngeal elevation;Reduced airway/laryngeal closure;Reduced tongue base retraction;Penetration/Apiration after swallow;Trace aspiration;Pharyngeal residue - valleculae;Pharyngeal residue - pyriform;Pharyngeal residue - posterior pharnyx;Pharyngeal residue - cp segment;Nasopharyngeal reflux Pharyngeal Material enters airway, passes BELOW cords without attempt by patient to eject out (silent aspiration) Pharyngeal- Thin Teaspoon Penetration/Aspiration before swallow;Reduced pharyngeal peristalsis;Reduced epiglottic inversion;Reduced anterior laryngeal mobility;Reduced laryngeal elevation;Reduced airway/laryngeal closure;Reduced tongue base retraction;Pharyngeal residue - valleculae;Pharyngeal residue - pyriform;Pharyngeal residue - posterior pharnyx;Pharyngeal residue - cp segment Pharyngeal Material enters airway, passes BELOW cords without attempt by patient to eject out (silent aspiration) Pharyngeal- Thin Cup -- Pharyngeal -- Pharyngeal- Thin Straw Reduced pharyngeal peristalsis;Reduced epiglottic inversion;Reduced anterior laryngeal mobility;Reduced laryngeal elevation;Reduced airway/laryngeal closure;Reduced tongue base retraction;Pharyngeal residue - valleculae;Pharyngeal residue - pyriform;Pharyngeal residue - posterior pharnyx;Pharyngeal residue - cp segment;Trace aspiration;Penetration/Aspiration during swallow;Penetration/Apiration after swallow;Penetration/Aspiration  before swallow  Pharyngeal Material enters airway, passes BELOW cords without attempt by patient to eject out (silent aspiration);Material enters airway, passes BELOW cords and not ejected out despite cough attempt by patient Pharyngeal- Puree Reduced pharyngeal peristalsis;Reduced epiglottic inversion;Reduced anterior laryngeal mobility;Reduced laryngeal elevation;Reduced airway/laryngeal closure;Reduced tongue base retraction;Pharyngeal residue - valleculae;Pharyngeal residue - pyriform;Pharyngeal residue - posterior pharnyx;Pharyngeal residue - cp segment Pharyngeal Material does not enter airway Pharyngeal- Mechanical Soft Reduced pharyngeal peristalsis;Reduced epiglottic inversion;Reduced anterior laryngeal mobility;Reduced laryngeal elevation;Reduced airway/laryngeal closure;Reduced tongue base retraction;Pharyngeal residue - valleculae;Pharyngeal residue - pyriform;Pharyngeal residue - posterior pharnyx;Pharyngeal residue - cp segment Pharyngeal Material does not enter airway Pharyngeal- Regular -- Pharyngeal -- Pharyngeal- Multi-consistency -- Pharyngeal -- Pharyngeal- Pill -- Pharyngeal -- Pharyngeal Comment chin tuck posture worsened swallow as it spilled pharyngeal retention into open airway, dry swallows needed with EVERY bolus to help decrease retention - pt largely only transits approx 10% to 20% of boluses into esophagus with initial swallow - cough, "hock" and expectoration also needed to help with clearance  CHL IP CERVICAL ESOPHAGEAL PHASE 08/08/2020 Cervical Esophageal Phase Impaired Pudding Teaspoon -- Pudding Cup -- Honey Teaspoon -- Honey Cup -- Nectar Teaspoon -- Nectar Cup -- Nectar Straw -- Thin Teaspoon -- Thin Cup -- Thin Straw -- Puree -- Mechanical Soft -- Regular -- Multi-consistency -- Pill -- Cervical Esophageal Comment -- Kathleen Lime, MS [facility] SLP Acute Rehab Services Office [phone] Pager [phone] Macario Golds 08/08/2020, 10:37 AM              DG FEMUR, MIN 2 VIEWS  RIGHT  Result Date: 08/12/2020 CLINICAL DATA:  Right hip fracture. EXAM: RIGHT FEMUR 2 VIEWS COMPARISON:  CT 08/06/2020.  Right femur 08/06/2020. FINDINGS: Total right hip replacement. Hardware intact. Fracture of the tip of the right greater trochanter tip, acetabulum, right inferior pubic ramus best identified by prior CT. A fracture of the proximal right femoral shaft is noted on today's exam. Severe degenerative changes right knee. Peripheral vascular calcification. IMPRESSION: 1. Fracture of the right proximal femoral shaft noted on today's exam. 2. Fractures of the right greater trochanter tip, acetabulum, right inferior pubic ramus best identified by prior CT. 3. Total right hip replacement. Hardware intact. Severe degenerative changes right knee. 4.  Peripheral vascular disease. These results will be called to the ordering clinician or representative by the Radiologist Assistant, and communication documented in the PACS or [employer]. These results will be called to the ordering clinician or representative by the Radiologist Assistant, and communication documented in the PACS or [employer]. Electronically Signed   By: Marcello Moores  Register   On: 08/12/2020 07:06   DG Femur Min 2 Views Right  Result Date: 08/06/2020 CLINICAL DATA:  Recent fall with right leg pain, initial encounter EXAM: RIGHT FEMUR 2 VIEWS COMPARISON:  None. FINDINGS: Right hip replacement is noted. No dislocation is seen. There is lucency identified in the region of the greater trochanter suspicious for underlying periprosthetic fracture. Additionally lucency is seen within the acetabulum on the right consistent with an undisplaced fracture. Distal femur appears within normal limits. Degenerative changes in the knee joint are seen. Multiple old healed fractures are noted within the pelvis. IMPRESSION: Findings suggestive of right greater trochanter periprosthetic fracture. Findings consistent with undisplaced right acetabular  fracture. CT may be helpful for further evaluation. Electronically Signed   By: Inez Catalina M.D.   On: 08/06/2020 08:57      Subjective:  Patient seen and examined the bedside this morning.  Comfortable.  Sitting on the  chair and trying to eat his lunch.  Daughter at the bedside.  We discussed the discharge planning.   Discharge Exam: Vitals:   08/18/20 0602 08/18/20 0617  BP: (!) 105/43 (!) 120/56  Pulse: (!) 58 64  Resp: 20   Temp: 97.7 F (36.5 C)   SpO2: 98%    Vitals:   08/17/20 2040 08/18/20 0602 08/18/20 0617 08/18/20 0626  BP: 120/67 (!) 105/43 (!) 120/56   Pulse: 76 (!) 58 64   Resp: 20 20    Temp: 98 F (36.7 C) 97.7 F (36.5 C)    TempSrc: Oral Oral    SpO2: 100% 98%    Weight:    45.8 kg  Height:        General: Pt is alert, awake, not in acute distress, cachectic/frail Cardiovascular: RRR, S1/S2 +, no rubs, no gallops Respiratory: CTA bilaterally, no wheezing, no rhonchi Abdominal: Soft, NT, ND, bowel sounds + Extremities: no edema, no cyanosis    The results of significant diagnostics from this hospitalization (including imaging, microbiology, ancillary and laboratory) are listed below for reference.     Microbiology: Recent Results (from the past 240 hour(s))  SARS CORONAVIRUS 2 (TAT 6-24 HRS) Nasopharyngeal Nasopharyngeal Swab     Status: None   Collection Time: 08/17/20  3:38 PM   Specimen: Nasopharyngeal Swab  Result Value Ref Range Status   SARS Coronavirus 2 NEGATIVE NEGATIVE Final    Comment: (NOTE) SARS-CoV-2 target nucleic acids are NOT DETECTED.  The SARS-CoV-2 RNA is generally detectable in upper and lower respiratory specimens during the acute phase of infection. Negative results do not preclude SARS-CoV-2 infection, do not rule out co-infections with other pathogens, and should not be used as the sole basis for treatment or other patient management decisions. Negative results must be combined with clinical observations, patient  history, and epidemiological information. The expected result is Negative.  Fact Sheet for Patients: SugarRoll.be  Fact Sheet for Healthcare Providers: https://www.woods-mathews.com/  This test is not yet approved or cleared by the [place] FDA and  has been authorized for detection and/or diagnosis of SARS-CoV-2 by FDA under an Emergency Use Authorization (EUA). This EUA will remain  in effect (meaning this test can be used) for the duration of the COVID-19 declaration under Se ction 564(b)(1) of the Act, 21 U.S.C. section 360bbb-3(b)(1), unless the authorization is terminated or revoked sooner.  Performed at [facility], [place] [address] [place], [place] [address]      Labs: BNP (last 3 results) No results for input(s): BNP in the last 8760 hours. Basic Metabolic Panel: Recent Labs  Lab 08/12/20 0405 08/13/20 0326 08/14/20 0339  NA 137 138 136  K 4.6 4.5 4.5  CL 102 102 103  CO2 32 32 31  GLUCOSE 98 102* 104*  BUN 27* 32* 27*  CREATININE 0.92 0.89 1.01  CALCIUM 9.4 9.4 9.1  MG 2.1 1.9  --   PHOS 3.3 3.0  --    Liver Function Tests: Recent Labs  Lab 08/12/20 0405 08/13/20 0326  AST 29 21  ALT 22 22  ALKPHOS 90 77  BILITOT 0.8 0.6  PROT 6.4* 6.2*  ALBUMIN 2.6* 2.6*   No results for input(s): LIPASE, AMYLASE in the last 168 hours. No results for input(s): AMMONIA in the last 168 hours. CBC: Recent Labs  Lab 08/12/20 0405 08/13/20 0326 08/14/20 0339  WBC 8.8 10.8* 10.4  NEUTROABS 6.8 8.4* 8.0*  HGB 8.2* 8.0* 8.0*  HCT 25.4* 25.1*  25.2*  MCV 107.2* 107.3* 109.1*  PLT 162 182 187   Cardiac Enzymes: No results for input(s): CKTOTAL, CKMB, CKMBINDEX, TROPONINI in the last 168 hours. BNP: Invalid input(s): POCBNP CBG: No results for input(s): GLUCAP in the last 168 hours. D-Dimer No results for input(s): DDIMER in the last 72 hours. Hgb A1c No results for input(s): HGBA1C in the last 72  hours. Lipid Profile No results for input(s): CHOL, HDL, LDLCALC, TRIG, CHOLHDL, LDLDIRECT in the last 72 hours. Thyroid function studies No results for input(s): TSH, T4TOTAL, T3FREE, THYROIDAB in the last 72 hours.  Invalid input(s): FREET3 Anemia work up No results for input(s): VITAMINB12, FOLATE, FERRITIN, TIBC, IRON, RETICCTPCT in the last 72 hours. Urinalysis    Component Value Date/Time   COLORURINE YELLOW 08/06/2020 0839   APPEARANCEUR CLEAR 08/06/2020 0839   LABSPEC 1.013 08/06/2020 0839   PHURINE 6.0 08/06/2020 0839   GLUCOSEU NEGATIVE 08/06/2020 0839   HGBUR NEGATIVE 08/06/2020 0839   BILIRUBINUR NEGATIVE 08/06/2020 0839   [place] 08/06/2020 0839   PROTEINUR NEGATIVE 08/06/2020 0839   NITRITE NEGATIVE 08/06/2020 0839   LEUKOCYTESUR MODERATE (A) 08/06/2020 0839   Sepsis Labs Invalid input(s): PROCALCITONIN,  WBC,  LACTICIDVEN Microbiology Recent Results (from the past 240 hour(s))  SARS CORONAVIRUS 2 (TAT 6-24 HRS) Nasopharyngeal Nasopharyngeal Swab     Status: None   Collection Time: 08/17/20  3:38 PM   Specimen: Nasopharyngeal Swab  Result Value Ref Range Status   SARS Coronavirus 2 NEGATIVE NEGATIVE Final    Comment: (NOTE) SARS-CoV-2 target nucleic acids are NOT DETECTED.  The SARS-CoV-2 RNA is generally detectable in upper and lower respiratory specimens during the acute phase of infection. Negative results do not preclude SARS-CoV-2 infection, do not rule out co-infections with other pathogens, and should not be used as the sole basis for treatment or other patient management decisions. Negative results must be combined with clinical observations, patient history, and epidemiological information. The expected result is Negative.  Fact Sheet for Patients: SugarRoll.be  Fact Sheet for Healthcare Providers: https://www.woods-mathews.com/  This test is not yet approved or cleared by the [place]  FDA and  has been authorized for detection and/or diagnosis of SARS-CoV-2 by FDA under an Emergency Use Authorization (EUA). This EUA will remain  in effect (meaning this test can be used) for the duration of the COVID-19 declaration under Se ction 564(b)(1) of the Act, 21 U.S.C. section 360bbb-3(b)(1), unless the authorization is terminated or revoked sooner.  Performed at [facility], [place] [address], [place], [place] [address]     Please note: You were cared for by a hospitalist during your hospital stay. Once you are discharged, your primary care physician will handle any further medical issues. Please note that NO REFILLS for any discharge medications will be authorized once you are discharged, as it is imperative that you return to your primary care physician (or establish a relationship with a primary care physician if you do not have one) for your post hospital discharge needs so that they can reassess your need for medications and monitor your lab values.    Time coordinating discharge: 40 minutes  SIGNED:   Shelly Coss, MD  [facility] 08/18/2020, 12:55 PM Pager [phone]  If 7PM-7AM, please contact night-coverage [email or web address] Password [email or web address]

## 2020-08-18 NOTE — Progress Notes (Signed)
Nutrition Follow-up  DOCUMENTATION CODES:   Severe malnutrition in context of chronic illness, Underweight  INTERVENTION:  - continue Ensure Enlive BID, Boost Plus once/day, and Magic Cup BID. - will order Juven BID due to new pressure injury, each packet provides 95 calories, 2.5 grams of protein (collagen), and 9.8 grams of carbohydrate (3 grams sugar); also contains 7 grams of L-arginine and L-glutamine, 300 mg vitamin C, 15 mg vitamin E, 1.2 mcg vitamin B-12, 9.5 mg zinc, 200 mg calcium, and 1.5 g  Calcium Beta-hydroxy-Beta-methylbutyrate to support wound healing.   NUTRITION DIAGNOSIS:   Severe Malnutrition related to dysphagia, chronic illness as evidenced by energy intake < or equal to 75% for > or equal to 1 month, severe fat depletion. -ongoing  GOAL:   Patient will meet greater than or equal to 90% of their needs -minimally met on average  MONITOR:   PO intake, Supplement acceptance, Weight trends, I & O's, Labs, Skin   ASSESSMENT:   80 y.o. male with history of hearing loss in left ear, coronary artery disease, constipation, osteoporosis, pulmonary fibrosis, anemia, cancer, memory loss who complains of right hip pain after fall. Admitted for Right hip periprosthetic greater trochanter fracture, acetabular fractures, right inferior pubic ramus fracture.  He has been accepting Ensure and Boost supplements 75-90% of the time offered.   He has been eating 50% on average during meals over the past 3 days.   Diet was advanced from FLD to Dysphagia 3, thin liquids on 6/23 following daughter's request for diet advancement and SLP re-evaluation.   Weight today is the lowest weight since admission. No information documented in edema section of flow sheet throughout hospitalization.  MD note from yesterday states that patient is medically stable for d/c to SNF but is pending insurance approval.    Labs reviewed; BUN: 27 mg/dl. Medications reviewed; 20 mg oral pepcid BID, 17 g  miralax/day, 1 tablet senokot BID, 1000 mcg oral cyanocobalamin/day.   Diet Order:   Diet Order             DIET DYS 3 Room service appropriate? Yes; Fluid consistency: Thin  Diet effective now                   EDUCATION NEEDS:   Education needs have been addressed  Skin:  Skin Assessment: Skin Integrity Issues: Skin Integrity Issues:: Stage II Stage II: coccyx (newly documented 6/22)  Last BM:  6/26 (type 6 x1)  Height:   Ht Readings from Last 1 Encounters:  08/06/20 _0  (1.753 m)    Weight:   Wt Readings from Last 1 Encounters:  08/18/20 45.8 kg      Estimated Nutritional Needs:  Kcal:  1850-2050 Protein:  85-100g Fluid:  2L/day      Jarome Matin, MS, RD, LDN, CNSC Inpatient Clinical Dietitian RD pager # available in AMION  After hours/weekend pager # available in [facility]

## 2020-08-18 NOTE — TOC Transition Note (Signed)
Transition of Care [facility]) - CM/SW Discharge Note  Patient Details  Name: Adam Owen MRN: [id number] Date of Birth: 1940-08-25  Transition of Care [facility]) CM/SW Contact:  Sherie Don, LCSW Phone Number: 08/18/2020, 2:53 PM  Clinical Narrative: CSW called Jenny Reichmann with [facility] this morning and was informed the patient's single case agreement is still pending. CSW called [facility] and spoke with Tye Maryland who confirmed the agreement had not been faxed or emailed to her. CSW called [facility] and spoke with Lattie Haw. CSW requested that the single case agreement be sent. CSW updated by Tye Maryland with [facility] the facility accepted the agreement and the patient was approved for discharge today.  CSW called [facility] [phone]) to follow up with the single case agreement for EMS transport that was started 08/14/20. CSW was informed there are no notes in the system about the patient needing EMS transport out of the service area even though CSW was told to call this number when the patient was ready for discharge. CSW was directed to Remus Blake [phone]) with the [facility]. Per Remus Blake, she does not assist with transportation and CSW was directed to call a Financial controller, Levada Dy [phone]). CSW was unable to reach [place], so CSW left a VM and requested a call back.  CSW called Lattie Haw with [facility] [phone]) regarding EMS approval. Lattie Haw requested to follow back up with CSW. CSW received return call from [place] and was informed that although the patient has a hip fracture, the insurance company does not consider EMS transportation to be medically necessary.  CSW updated daughter. Daughter and SIL to transport patient to [facility]. COVID test was negative. Discharge summary, discharge orders, and SNF transfer report faxed to facility in hub. Discharge packet completed and provided to RN. RN aware family will transport patient. CSW left VM for Cathy with [employer] that patient will be transported by family. TOC signing off.  Final next level of care: Skilled Nursing Facility Barriers to Discharge: Barriers Resolved  Patient Goals and CMS Choice Patient states their goals for this hospitalization and ongoing recovery are:: Improve mobility CMS Medicare.gov Compare Post Acute Care list provided to:: Patient Represenative (must comment) Choice offered to / list presented to : Adult Children  Discharge Placement PASRR number recieved: 08/07/20        Patient [facility] bed at: [facility] Patient to be transferred to facility by: Family Name of family member notified: Broadus John (daughter) Patient and family notified of of transfer: 08/18/20  Discharge Plan and Services In-house Referral: Clinical Social Work Post Acute Care Choice: [place]          DME Arranged: N/A DME Agency: NA  Readmission Risk Interventions No flowsheet data found.

## 2020-08-19 ENCOUNTER — Telehealth: Payer: Self-pay | Admitting: *Deleted

## 2020-08-19 NOTE — Telephone Encounter (Signed)
Broadus John, patient's daughter called to inform CN that patient discharged to [facility] for skilled nursing care.

## 2020-10-23 ENCOUNTER — Emergency Department: Payer: Medicare HMO

## 2020-10-23 ENCOUNTER — Emergency Department
Admission: EM | Admit: 2020-10-23 | Discharge: 2020-10-24 | Disposition: A | Discharge status: Home / Self Care | Payer: Medicare HMO | Attending: Student in an Organized Health Care Education/Training Program | Admitting: Student in an Organized Health Care Education/Training Program

## 2020-10-23 DIAGNOSIS — R000 Tachycardia, unspecified: Secondary | ICD-10-CM

## 2020-10-23 DIAGNOSIS — N390 Urinary tract infection, site not specified: Secondary | ICD-10-CM | POA: Insufficient documentation

## 2020-10-23 LAB — COMPREHENSIVE METABOLIC PANEL
ALT: 24 U/L (ref 0–55)
AST (SGOT): 36 U/L — ABNORMAL HIGH (ref 5–34)
Albumin/Globulin Ratio: 0.5 — ABNORMAL LOW (ref 0.9–2.2)
Albumin: 2.4 g/dL — ABNORMAL LOW (ref 3.5–5.0)
Alkaline Phosphatase: 127 U/L — ABNORMAL HIGH (ref 37–117)
Anion Gap: 10 (ref 5.0–15.0)
BUN: 23 mg/dL (ref 9.0–28.0)
Bilirubin, Total: 0.8 mg/dL (ref 0.2–1.2)
CO2: 27 mEq/L (ref 22–29)
Calcium: 9.9 mg/dL (ref 7.9–10.2)
Chloride: 106 mEq/L (ref 100–111)
Creatinine: 0.8 mg/dL (ref 0.7–1.3)
Globulin: 4.4 g/dL — ABNORMAL HIGH (ref 2.0–3.6)
Glucose: 80 mg/dL (ref 70–100)
Potassium: 5.2 mEq/L — ABNORMAL HIGH (ref 3.5–5.1)
Protein, Total: 6.8 g/dL (ref 6.0–8.3)
Sodium: 143 mEq/L (ref 136–145)

## 2020-10-23 LAB — URINALYSIS REFLEX TO MICROSCOPIC EXAM - REFLEX TO CULTURE
Bilirubin, UA: NEGATIVE
Glucose, UA: NEGATIVE
Ketones UA: NEGATIVE
Nitrite, UA: NEGATIVE
Protein, UR: 30 — AB
Specific Gravity UA: 1.016 (ref 1.001–1.035)
Urine pH: 5 (ref 5.0–8.0)
Urobilinogen, UA: NORMAL mg/dL (ref 0.2–2.0)

## 2020-10-23 LAB — CBC AND DIFFERENTIAL
Absolute NRBC: 0 10*3/uL (ref 0.00–0.00)
Basophils Absolute Automated: 0.04 10*3/uL (ref 0.00–0.08)
Basophils Automated: 0.2 %
Eosinophils Absolute Automated: 0 10*3/uL (ref 0.00–0.44)
Eosinophils Automated: 0 %
Hematocrit: 37 % — ABNORMAL LOW (ref 37.6–49.6)
Hgb: 12.1 g/dL — ABNORMAL LOW (ref 12.5–17.1)
Immature Granulocytes Absolute: 0.19 10*3/uL — ABNORMAL HIGH (ref 0.00–0.07)
Immature Granulocytes: 0.8 %
Lymphocytes Absolute Automated: 0.36 10*3/uL — ABNORMAL LOW (ref 0.42–3.22)
Lymphocytes Automated: 1.6 %
MCH: 33.5 pg (ref 25.1–33.5)
MCHC: 32.7 g/dL (ref 31.5–35.8)
MCV: 102.5 fL — ABNORMAL HIGH (ref 78.0–96.0)
MPV: 12 fL (ref 8.9–12.5)
Monocytes Absolute Automated: 1.09 10*3/uL — ABNORMAL HIGH (ref 0.21–0.85)
Monocytes: 4.8 %
Neutrophils Absolute: 20.95 10*3/uL — ABNORMAL HIGH (ref 1.10–6.33)
Neutrophils: 92.6 %
Nucleated RBC: 0 /100 WBC (ref 0.0–0.0)
Platelets: 149 10*3/uL (ref 142–346)
RBC: 3.61 10*6/uL — ABNORMAL LOW (ref 4.20–5.90)
RDW: 19 % — ABNORMAL HIGH (ref 11–15)
WBC: 22.63 10*3/uL — ABNORMAL HIGH (ref 3.10–9.50)

## 2020-10-23 LAB — GFR: EGFR: >60

## 2020-10-23 LAB — TROPONIN I
Troponin I: 0.05 ng/mL (ref 0.00–0.05)
Troponin I: 0.04 ng/mL (ref 0.00–0.05)

## 2020-10-23 LAB — LACTIC ACID, PLASMA: Lactic Acid: 1.8 mmol/L (ref 0.2–2.0)

## 2020-10-23 MED ORDER — CEPHALEXIN 500 MG PO CAPS
500.0000 mg | ORAL_CAPSULE | Freq: Four times a day (QID) | ORAL | 0 refills | Status: AC
Start: 2020-10-23 — End: 2020-11-02

## 2020-10-23 MED ORDER — LACTATED RINGERS IV BOLUS
1000.0000 mL | Freq: Once | INTRAVENOUS | Status: AC
Start: 2020-10-23 — End: 2020-10-23
  Administered 2020-10-23: 1000 mL via INTRAVENOUS

## 2020-10-23 MED ORDER — CEFTRIAXONE SODIUM 1 G IJ SOLR
1.0000 g | Freq: Once | INTRAMUSCULAR | Status: AC
Start: 2020-10-23 — End: 2020-10-23
  Administered 2020-10-23: 1 g via INTRAVENOUS
  Filled 2020-10-23: qty 1000

## 2020-10-23 MED ORDER — SODIUM CHLORIDE 0.9 % IV BOLUS
1000.0000 mL | Freq: Once | INTRAVENOUS | Status: AC
Start: 2020-10-23 — End: 2020-10-24
  Administered 2020-10-23: 1000 mL via INTRAVENOUS

## 2020-10-23 MED ORDER — VANCOMYCIN HCL IN NACL 750-0.9 MG/250ML-% IV SOLN
15.0000 mg/kg | Freq: Once | INTRAVENOUS | Status: AC
Start: 2020-10-23 — End: 2020-10-23
  Administered 2020-10-23: 750 mg via INTRAVENOUS
  Filled 2020-10-23: qty 250

## 2020-10-23 MED ORDER — PIPERACILLIN SOD-TAZOBACTAM SO 4.5 (4-0.5) G IV SOLR
4.5000 g | Freq: Four times a day (QID) | INTRAVENOUS | Status: DC
Start: 2020-10-23 — End: 2020-10-24
  Administered 2020-10-23: 4.5 g via INTRAVENOUS
  Filled 2020-10-23: qty 20

## 2020-10-23 NOTE — ED Provider Notes (Signed)
History     Chief Complaint   Patient presents with    Generalized weakness    Fatigue    Failure To Thrive       Jordan [name] is a 80 year old male brought in by his daughter for concerns of decreased mental status.  The daughter reports for 2 days he has been increasingly more tired and fatigued and not interacting as normal.  He has had dark-colored urine.  He has a chronic cough which has not increased or changed in nature.  He has some mild labored breathing at baseline.  She denies any nausea vomiting or diarrhea or known fevers or chills at home.  She reports at baseline patient is usually interactive with short conversations and very jovial and jokes in nature.  He is able to feed himself but otherwise has a home health aide that does his home health care and activities of daily living.  The daughter reports patient has been on a somewhat declined since a fall and broken hip several months ago but is not enrolled in hospice.      Past Medical History:   Diagnosis Date    Arthritis     Hypertension     Myocardial infarct     Sleep apnea        Past Surgical History:   Procedure Laterality Date    CARDIAC SURGERY      EYE SURGERY      HERNIA REPAIR      JOINT REPLACEMENT      TONGUE SURGERY         History reviewed. No pertinent family history.    Social  Social History     Tobacco Use    Smoking status: Never    Smokeless tobacco: Never   Substance Use Topics    Alcohol use: No    Drug use: No       .     Allergies   Allergen Reactions    Iodine        Home Medications               alendronate (FOSAMAX) 70 MG tablet     Take 70 mg by mouth every 7 days.Take 1 tab by mouth every 7 days with a full glass of water on an empty stomach. Do not take anything else by mouth or lie down for 30 mins.     aspirin 81 MG chewable tablet     Chew 81 mg by mouth daily.     atorvastatin (LIPITOR) 40 MG tablet     Take 40 mg by mouth daily.     colchicine 0.6 MG tablet     Take 0.6 mg by mouth daily.     famotidine (PEPCID)  20 MG tablet     Take by mouth daily        metoprolol (LOPRESSOR) 50 MG tablet     Take 50 mg by mouth 2 (two) times daily.     naproxen (NAPROSYN) 375 MG tablet     Take 1 tablet (375 mg total) by mouth 2 (two) times daily with meals     raNITIdine (ZANTAC) 150 MG tablet     Take 150 mg by mouth 2 (two) times daily.             Review of Systems   Constitutional:  Positive for activity change and fatigue.   Genitourinary:         Dark colored urine  Physical Exam    BP: 124/81, Heart Rate: (!) 116, Temp: 97.5 F (36.4 C), Resp Rate: (!) 24, SpO2: 93 %     Physical Exam  Vitals and nursing note reviewed.   Constitutional:       Appearance: He is well-developed.      Comments: Cachetic appearing   HENT:      Head: Normocephalic and atraumatic.      Nose: Nose normal.      Mouth/Throat:      Mouth: Mucous membranes are dry.   Eyes:      Conjunctiva/sclera: Conjunctivae normal.   Cardiovascular:      Rate and Rhythm: Regular rhythm. Tachycardia present.      Heart sounds: Normal heart sounds.   Pulmonary:      Effort: Pulmonary effort is normal.      Breath sounds: Normal breath sounds.      Comments: Appears to be working to breathe with abdominal breathing, daughter reports this is baseline  Abdominal:      General: Abdomen is flat. Bowel sounds are normal.      Palpations: Abdomen is soft.   Musculoskeletal:         General: Normal range of motion.      Cervical back: Normal range of motion and neck supple.      Comments: Superficial skin tear to right mid forearm with bruising. Posterior left shoulder with 1-2cm circular skin ulcer, early stage and appears to be healing   Skin:     General: Skin is warm and dry.      Capillary Refill: Capillary refill takes less than 2 seconds.   Neurological:      Mental Status: He is alert and oriented to person, place, and time. Mental status is at baseline.   Psychiatric:         Mood and Affect: Mood normal.         Behavior: Behavior normal.         Thought Content:  Thought content normal.         Judgment: Judgment normal.         MDM and ED Course     ED Medication Orders (From admission, onward)      Start Ordered     Status Ordering Provider    10/23/20 1932 10/23/20 1931  piperacillin-tazobactam (ZOSYN) injection 4.5 g  Every 6 hours        Route: Intravenous  Ordered Dose: 4.5 g     Ordered Jordan [name]               MDM    EKG Read: EKG with significant artifact, appears with no significant changes from priors including TWI.    Patient with concerns for UTI    11:08 PM    Discussed with patient's daughter option of being admitted for IV antibiotics and fluids versus discharge home patient.  Daughter prefers to take patient home reports he lives 5 minutes from the hospital and should there be any worsening concerns will be able to return immediately.  Daughter reports that patient typically drinks a lot of fluids throughout the day and will be easy to hydrate along with tolerating oral intake with no concerns.  Stressed at daughter importance of returning should anything change or be different.  Daughter feels comfortable with plan.  Plan give 1 dose of IV ceftriaxone along with another liter of fluids and be discharged home  Procedures    Clinical Impression & Disposition     Clinical Impression  Final diagnoses:   None        ED Disposition       None             New Prescriptions    No medications on file                   Melton Krebs, MD  10/23/20 2309

## 2020-10-23 NOTE — ED Triage Notes (Signed)
Increased weakness over the last couple of days. Sent here to r/o UTI.

## 2020-10-25 LAB — ECG 12-LEAD
Atrial Rate: 112 {beats}/min
P Axis: 83 degrees
P-R Interval: 158 ms
Q-T Interval: 330 ms
QRS Duration: 100 ms
QTC Calculation (Bezet): 450 ms
R Axis: 36 degrees
T Axis: 87 degrees
Ventricular Rate: 112 {beats}/min

## 2020-11-22 DEATH — deceased

## 2023-04-21 IMAGING — CT CT HIP*R* W/O CM
2 of 5 series · 14 of 46 positions shown, 18 images · non-contrast
Comparison: Pelvis and right femur x-rays from same day.

CLINICAL DATA: Right hip pain since fall yesterday.

EXAM:
CT OF THE RIGHT HIP WITHOUT CONTRAST
TECHNIQUE: Multidetector CT imaging of the right hip was performed according to
the standard protocol. Multiplanar CT image reconstructions were
also generated.

[Series 4: thin soft (person_name) · axial · 0.42mm/px · z∈[+650,+894]mm · 11 of 556 slices shown, 15 images]
[im 45/556  soft-tissue]
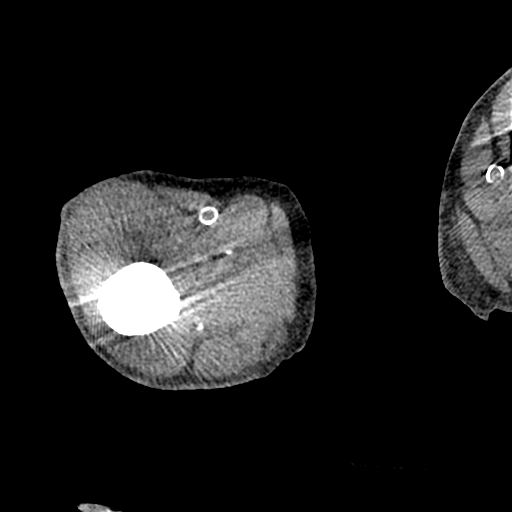
[im 45/556  bone]
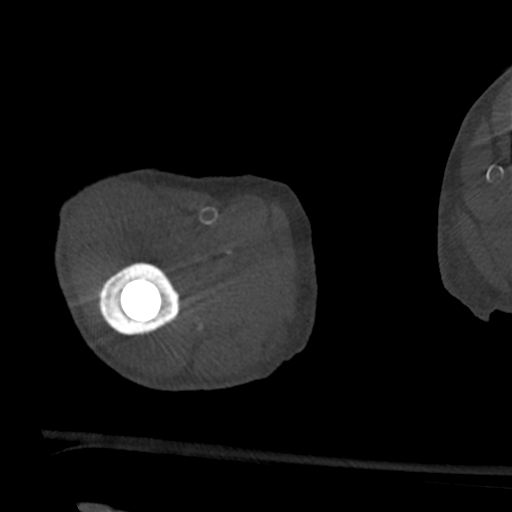
[im 112/556  soft-tissue]
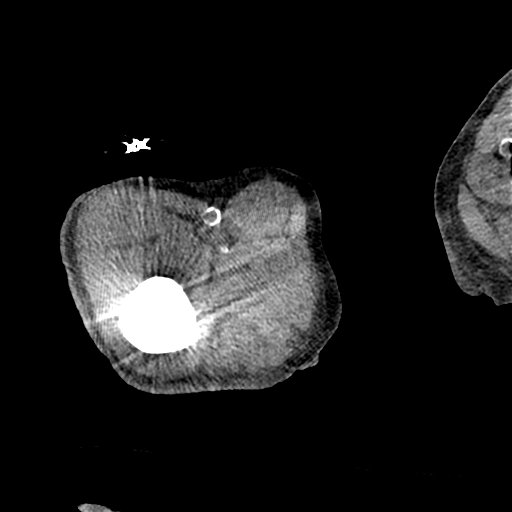
[im 156/556  soft-tissue]
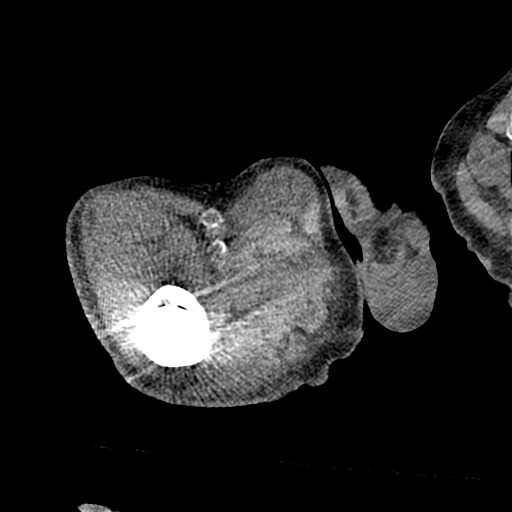
[im 223/556  soft-tissue]
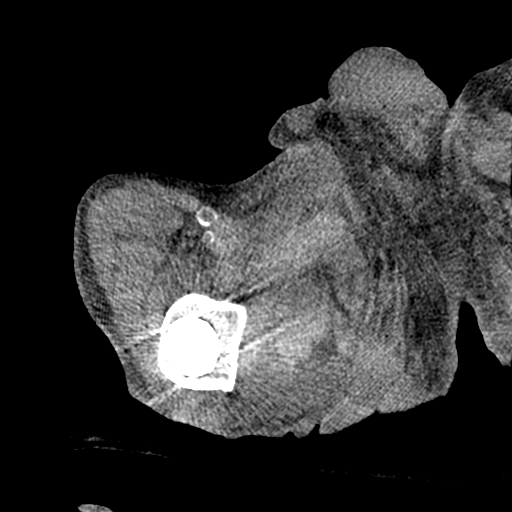
[im 289/556  soft-tissue]
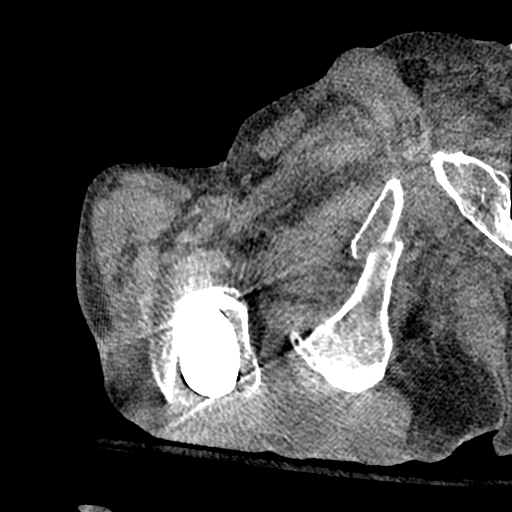
[im 334/556  soft-tissue]
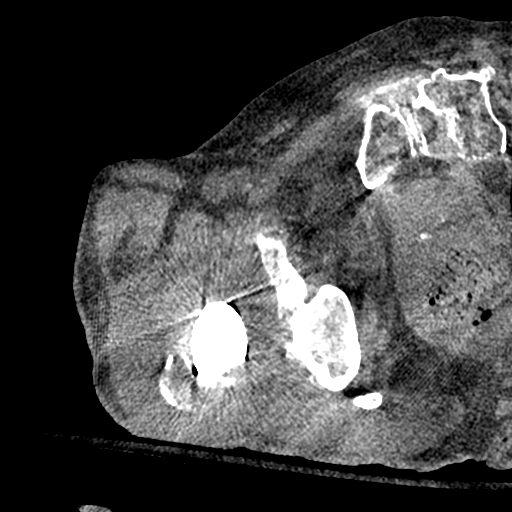
[im 400/556  soft-tissue]
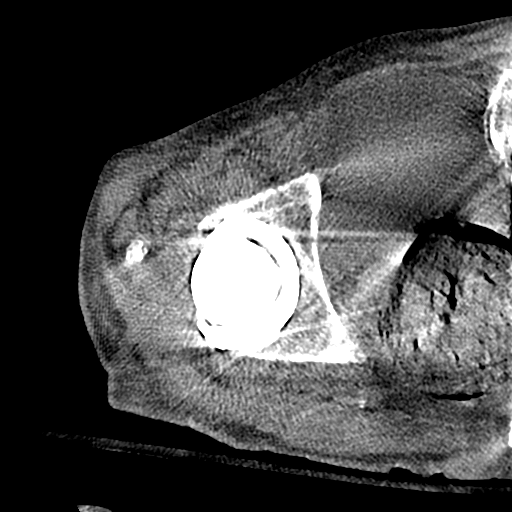
[im 467/556  soft-tissue]
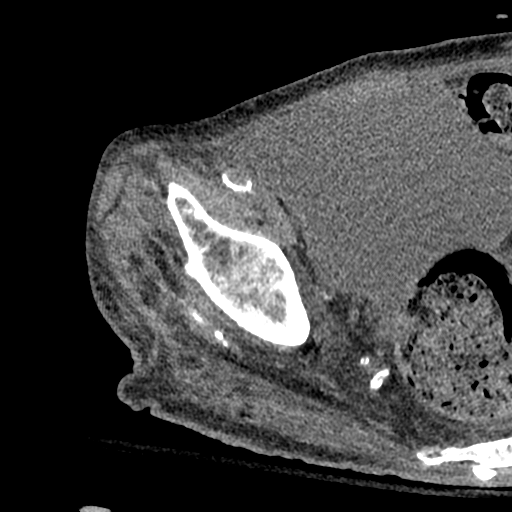
[im 467/556  lung]
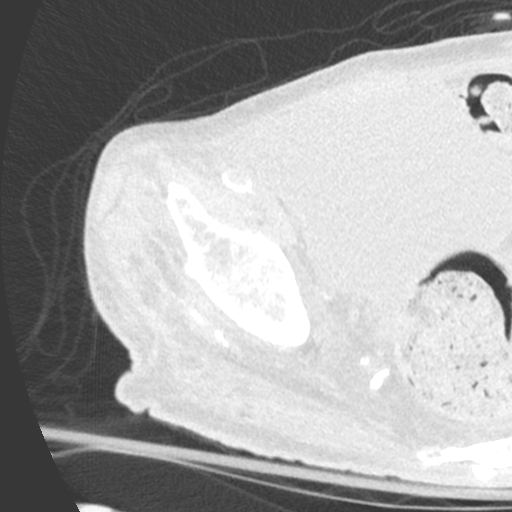
[im 489/556  lung]
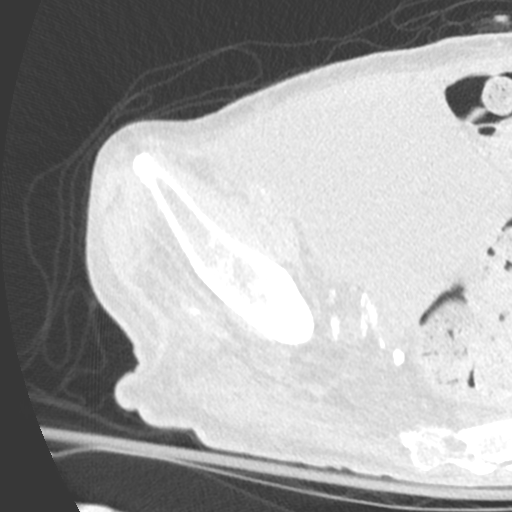
[im 511/556  soft-tissue]
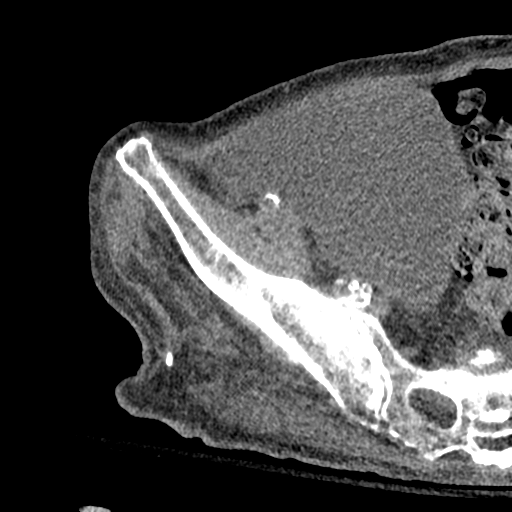
[im 511/556  lung]
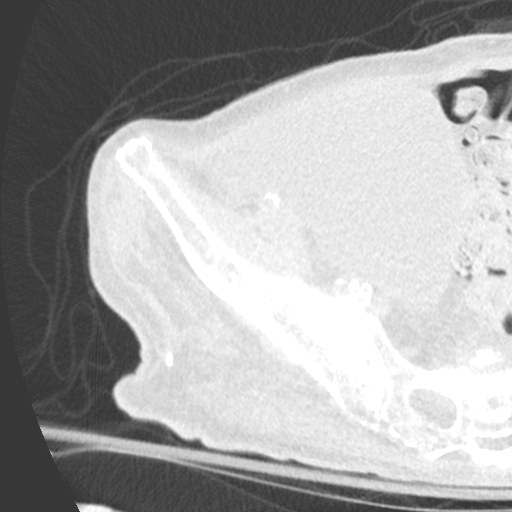
[im 511/556  bone]
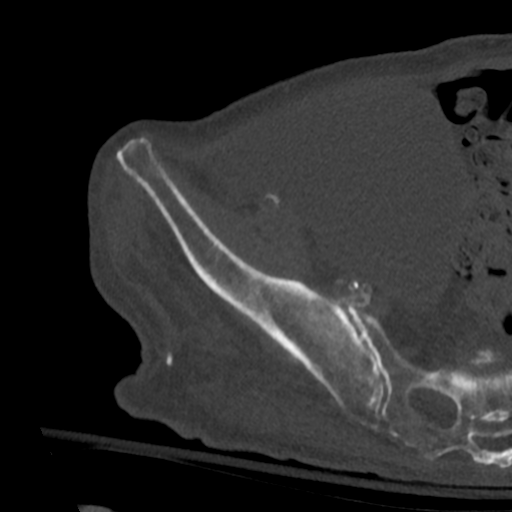
[im 533/556  lung]
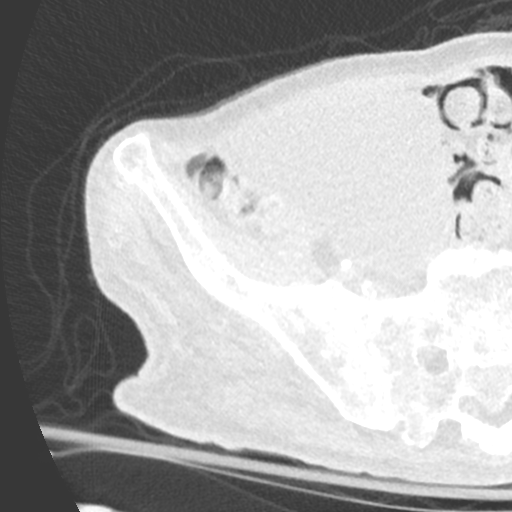

[Series 8: coronal soft (person_name) · coronal · 0.49mm/px · 3 of 96 slices shown]
[im 32/96  soft-tissue]
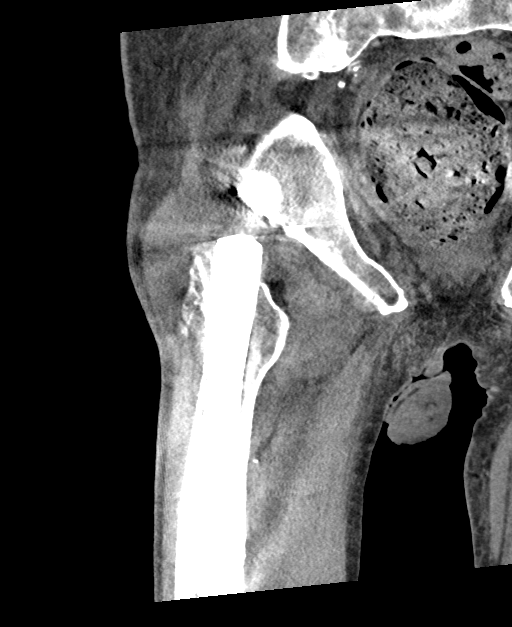
[im 43/96  soft-tissue]
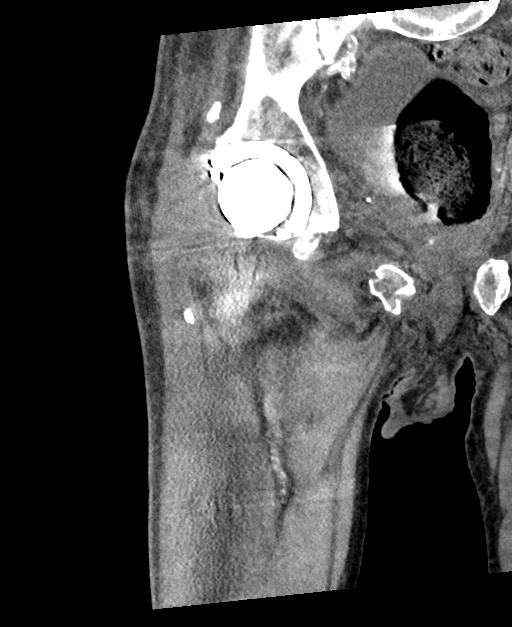
[im 53/96  soft-tissue]
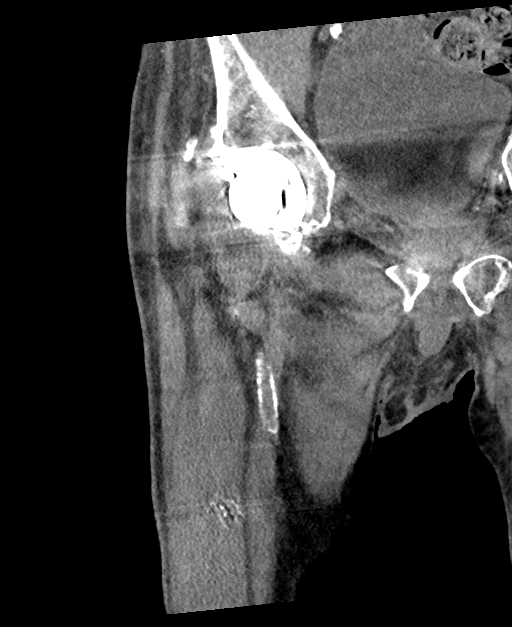

[14 of 46 positions shown; findings below may reference images not displayed]

FINDINGS: Bones/Joint/Cartilage

Prior right total hip arthroplasty. No evidence of hardware failure
or loosening. Acute nondisplaced fracture of the right greater
trochanter (series 6, image 22). Acute nondisplaced fractures of the
right medial and posterior acetabulum (series 6, images 48 and 29).
Acute mildly displaced fracture of the right inferior pubic ramus
(series 3, image 67).

Old healed fractures of the left superior and inferior pubic rami.
No joint effusion.

Ligaments

Ligaments are suboptimally evaluated by CT.

Muscles and Tendons
Grossly intact.  Right gluteus muscle atrophy.

Soft tissue
Small hematoma overlying the greater trochanter. No soft tissue
mass.
IMPRESSION: 1. Acute nondisplaced fracture of the right greater trochanter.
2. Acute nondisplaced fractures of the right medial and posterior
acetabulum.
3. Acute mildly displaced fracture of the right inferior pubic
ramus.
4. Prior right total hip arthroplasty without evidence of hardware
complication.

## 2023-04-27 IMAGING — DX DG PORTABLE PELVIS
1 series · 1 of 1 positions shown · non-contrast
Comparison: CT 08/06/2020.  Pelvis 08/06/2020.

CLINICAL DATA: Right hip fracture.

EXAM:
PORTABLE PELVIS 1-2 VIEWS

[pelvis lat]
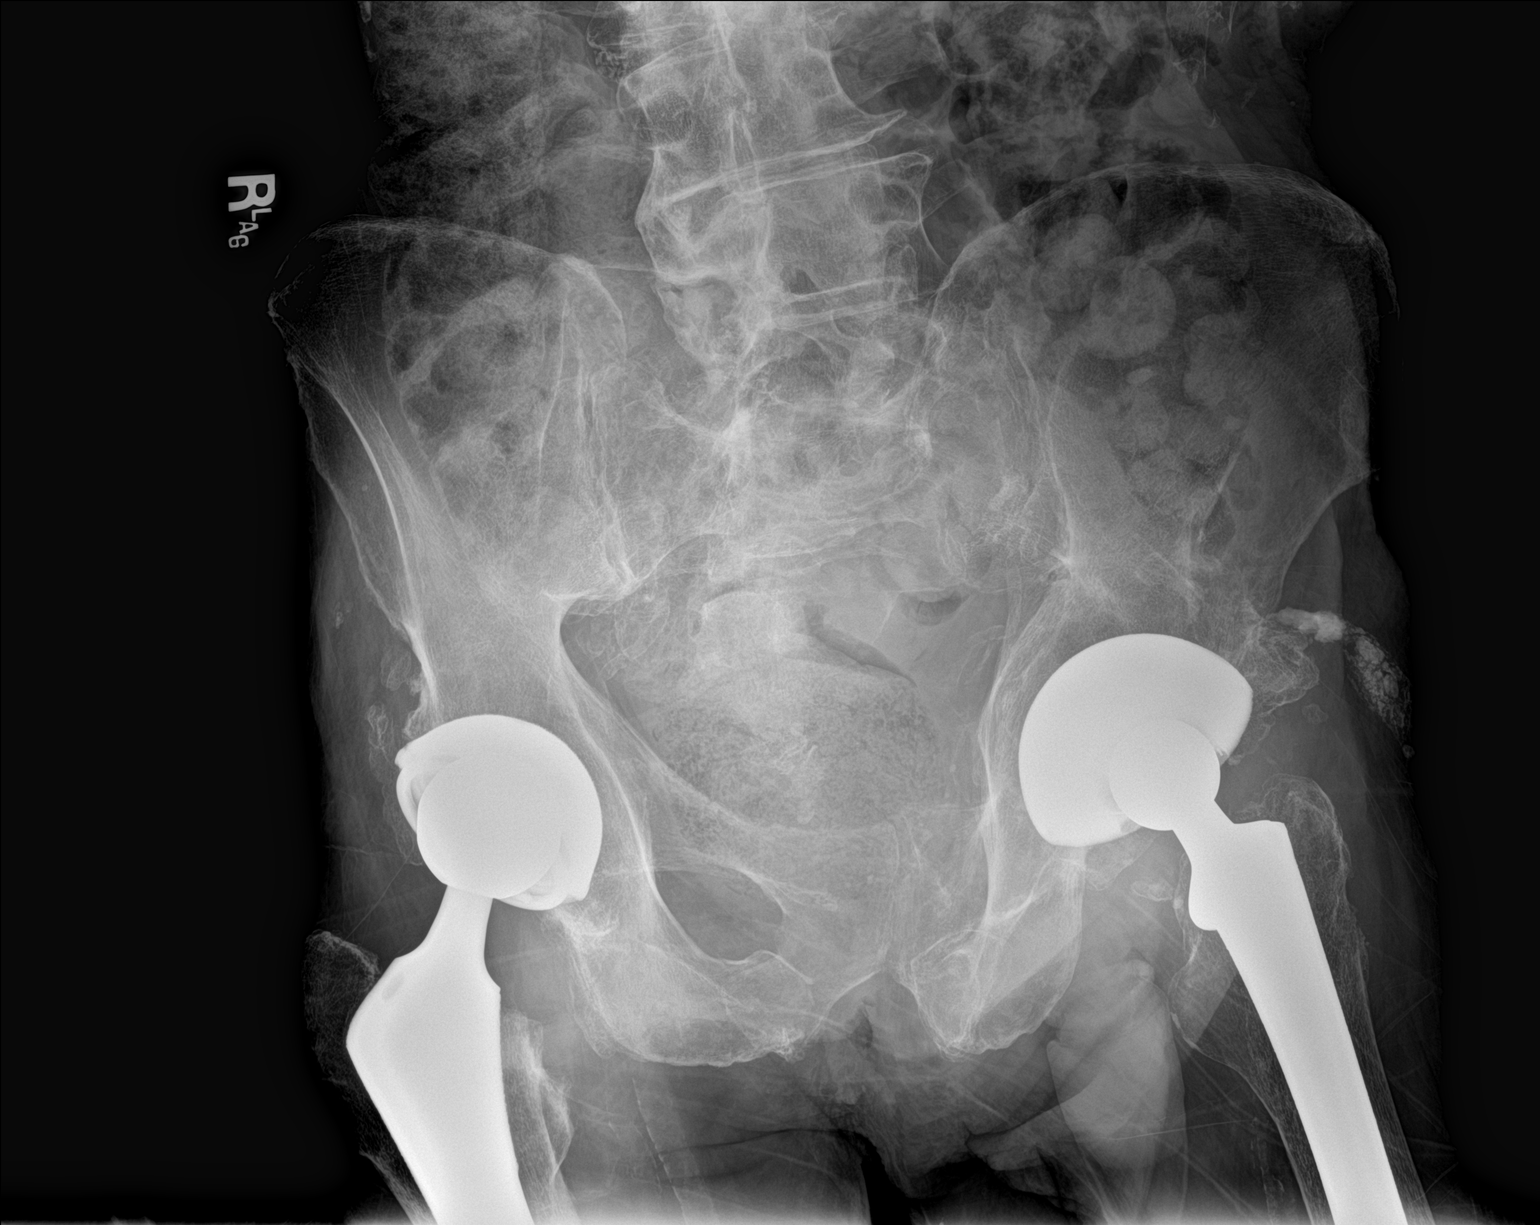

[1 of 1 positions shown; findings below may reference images not displayed]

FINDINGS: Fracture of the tip of the right greater trochanter again noted.
Fracture of the right inferior pubic ramus again noted. These
fractures and the right acetabular fractures best identified on
prior CT. Bilateral total hip replacements. Visualized hardware
intact. Diffuse osteopenia. Degenerative changes lumbar spine.
Stable calcific density noted over the left hip. Peripheral vascular
calcification.
IMPRESSION: 1. Fracture of the tip of the right greater trochanter again noted.
Fracture of the right inferior pubic ramus again noted. These
fractures and the right acetabular fractures best identified by
prior CT. Again a proximal right femoral shaft fracture was
identified on today's right femur exam.

2.  Bilateral total hip replacement.  Visualized hardware intact.
